# Patient Record
Sex: Male | Born: 1955
Health system: Southern US, Community
[De-identification: ages and names within clinical notes are randomized; demographics above are authoritative.]

## PROBLEM LIST (undated history)

## (undated) DIAGNOSIS — E785 Hyperlipidemia, unspecified: Secondary | ICD-10-CM

## (undated) DIAGNOSIS — E782 Mixed hyperlipidemia: Secondary | ICD-10-CM

## (undated) DIAGNOSIS — F319 Bipolar disorder, unspecified: Secondary | ICD-10-CM

## (undated) DIAGNOSIS — R7303 Prediabetes: Secondary | ICD-10-CM

## (undated) DIAGNOSIS — G4700 Insomnia, unspecified: Secondary | ICD-10-CM

## (undated) DIAGNOSIS — K219 Gastro-esophageal reflux disease without esophagitis: Secondary | ICD-10-CM

## (undated) DIAGNOSIS — F32A Depression, unspecified: Secondary | ICD-10-CM

## (undated) DIAGNOSIS — F419 Anxiety disorder, unspecified: Secondary | ICD-10-CM

## (undated) DIAGNOSIS — K635 Polyp of colon: Secondary | ICD-10-CM

## (undated) DIAGNOSIS — R413 Other amnesia: Secondary | ICD-10-CM

## (undated) DIAGNOSIS — F329 Major depressive disorder, single episode, unspecified: Secondary | ICD-10-CM

## (undated) DIAGNOSIS — H269 Unspecified cataract: Secondary | ICD-10-CM

## (undated) DIAGNOSIS — N400 Enlarged prostate without lower urinary tract symptoms: Secondary | ICD-10-CM

## (undated) DIAGNOSIS — H9313 Tinnitus, bilateral: Secondary | ICD-10-CM

## (undated) DIAGNOSIS — I499 Cardiac arrhythmia, unspecified: Secondary | ICD-10-CM

## (undated) DIAGNOSIS — Z6837 Body mass index (BMI) 37.0-37.9, adult: Secondary | ICD-10-CM

## (undated) DIAGNOSIS — K760 Fatty (change of) liver, not elsewhere classified: Secondary | ICD-10-CM

## (undated) DIAGNOSIS — N183 Chronic kidney disease, stage 3 unspecified: Secondary | ICD-10-CM

## (undated) DIAGNOSIS — G4730 Sleep apnea, unspecified: Secondary | ICD-10-CM

## (undated) DIAGNOSIS — Z8249 Family history of ischemic heart disease and other diseases of the circulatory system: Secondary | ICD-10-CM

## (undated) DIAGNOSIS — E559 Vitamin D deficiency, unspecified: Secondary | ICD-10-CM

## (undated) DIAGNOSIS — M2021 Hallux rigidus, right foot: Principal | ICD-10-CM

## (undated) DIAGNOSIS — R2681 Unsteadiness on feet: Secondary | ICD-10-CM

## (undated) DIAGNOSIS — I10 Essential (primary) hypertension: Secondary | ICD-10-CM

## (undated) DIAGNOSIS — E039 Hypothyroidism, unspecified: Secondary | ICD-10-CM

## (undated) DIAGNOSIS — R0602 Shortness of breath: Secondary | ICD-10-CM

## (undated) DIAGNOSIS — N179 Acute kidney failure, unspecified: Secondary | ICD-10-CM

## (undated) DIAGNOSIS — Z86010 Personal history of colonic polyps: Secondary | ICD-10-CM

## (undated) DIAGNOSIS — E079 Disorder of thyroid, unspecified: Secondary | ICD-10-CM

## (undated) DIAGNOSIS — J449 Chronic obstructive pulmonary disease, unspecified: Secondary | ICD-10-CM

## (undated) DIAGNOSIS — Z720 Tobacco use: Secondary | ICD-10-CM

## (undated) HISTORY — DX: Gastro-esophageal reflux disease without esophagitis: K21.9

## (undated) HISTORY — DX: Essential (primary) hypertension: I10

## (undated) HISTORY — DX: Benign prostatic hyperplasia without lower urinary tract symptoms: N40.0

## (undated) HISTORY — DX: Polyp of colon: K63.5

## (undated) HISTORY — DX: Mixed hyperlipidemia: E78.2

## (undated) HISTORY — DX: Morbid (severe) obesity due to excess calories: E66.01

## (undated) HISTORY — DX: Tobacco use: Z72.0

## (undated) HISTORY — DX: Prediabetes: R73.03

## (undated) HISTORY — DX: Tinnitus, bilateral: H93.13

## (undated) HISTORY — DX: Family history of ischemic heart disease and other diseases of the circulatory system: Z82.49

## (undated) HISTORY — DX: Chronic kidney disease, stage 3 unspecified: N18.30

## (undated) HISTORY — DX: Personal history of colonic polyps: Z86.010

## (undated) HISTORY — DX: Shortness of breath: R06.02

## (undated) HISTORY — DX: Insomnia, unspecified: G47.00

## (undated) HISTORY — DX: Bipolar disorder, unspecified: F31.9

## (undated) HISTORY — DX: Chronic obstructive pulmonary disease, unspecified: J44.9

## (undated) HISTORY — DX: Unspecified cataract: H26.9

## (undated) HISTORY — DX: Hallux rigidus, right foot: M20.21

## (undated) HISTORY — DX: Body mass index (BMI) 37.0-37.9, adult: Z68.37

## (undated) HISTORY — DX: Hypothyroidism, unspecified: E03.9

## (undated) HISTORY — DX: Unsteadiness on feet: R26.81

## (undated) HISTORY — PX: OTHER SURGICAL HISTORY: SHX169

## (undated) HISTORY — DX: Chronic kidney disease, stage 3 (moderate): N18.3

## (undated) HISTORY — DX: Vitamin D deficiency, unspecified: E55.9

## (undated) HISTORY — DX: Acute kidney failure, unspecified: N17.9

## (undated) HISTORY — PX: WISDOM TOOTH EXTRACTION: SHX21

## (undated) HISTORY — DX: Hyperlipidemia, unspecified: E78.5

## (undated) HISTORY — DX: Other amnesia: R41.3

## (undated) HISTORY — DX: Disorder of thyroid, unspecified: E07.9

---

## 2009-02-13 ENCOUNTER — Emergency Department (HOSPITAL_BASED_OUTPATIENT_CLINIC_OR_DEPARTMENT_OTHER): Admission: EM | Admit: 2009-02-13 | Discharge: 2009-02-13 | Payer: Self-pay | Admitting: Emergency Medicine

## 2009-02-13 ENCOUNTER — Ambulatory Visit: Payer: Self-pay | Admitting: Radiology

## 2011-03-22 LAB — URINALYSIS, ROUTINE W REFLEX MICROSCOPIC
Glucose, UA: NEGATIVE mg/dL
Hgb urine dipstick: NEGATIVE
Protein, ur: NEGATIVE mg/dL
Specific Gravity, Urine: 1.01 (ref 1.005–1.030)
pH: 6.5 (ref 5.0–8.0)

## 2012-06-04 ENCOUNTER — Other Ambulatory Visit: Payer: Self-pay | Admitting: Family Medicine

## 2012-06-04 DIAGNOSIS — R1310 Dysphagia, unspecified: Secondary | ICD-10-CM

## 2012-06-23 ENCOUNTER — Ambulatory Visit
Admission: RE | Admit: 2012-06-23 | Discharge: 2012-06-23 | Disposition: A | Discharge status: Home / Self Care | Payer: BC Managed Care – PPO | Source: Ambulatory Visit | Attending: Family Medicine | Admitting: Family Medicine

## 2012-06-23 DIAGNOSIS — R1310 Dysphagia, unspecified: Secondary | ICD-10-CM

## 2014-08-11 DIAGNOSIS — F319 Bipolar disorder, unspecified: Secondary | ICD-10-CM | POA: Insufficient documentation

## 2014-08-11 DIAGNOSIS — K219 Gastro-esophageal reflux disease without esophagitis: Secondary | ICD-10-CM

## 2014-08-11 DIAGNOSIS — E039 Hypothyroidism, unspecified: Secondary | ICD-10-CM

## 2014-08-11 HISTORY — DX: Hypothyroidism, unspecified: E03.9

## 2014-08-11 HISTORY — DX: Gastro-esophageal reflux disease without esophagitis: K21.9

## 2014-12-16 ENCOUNTER — Ambulatory Visit: Payer: BC Managed Care – PPO | Admitting: Cardiology

## 2015-01-19 ENCOUNTER — Encounter: Payer: Self-pay | Admitting: Interventional Cardiology

## 2015-01-19 ENCOUNTER — Ambulatory Visit: Payer: Self-pay | Admitting: Interventional Cardiology

## 2015-01-19 ENCOUNTER — Ambulatory Visit (INDEPENDENT_AMBULATORY_CARE_PROVIDER_SITE_OTHER): Payer: BLUE CROSS/BLUE SHIELD | Admitting: Interventional Cardiology

## 2015-01-19 VITALS — BP 105/79 | HR 100 | Ht 72.0 in | Wt 233.0 lb

## 2015-01-19 DIAGNOSIS — I10 Essential (primary) hypertension: Secondary | ICD-10-CM

## 2015-01-19 DIAGNOSIS — Z8249 Family history of ischemic heart disease and other diseases of the circulatory system: Secondary | ICD-10-CM

## 2015-01-19 DIAGNOSIS — R079 Chest pain, unspecified: Secondary | ICD-10-CM

## 2015-01-19 HISTORY — DX: Essential (primary) hypertension: I10

## 2015-01-19 HISTORY — DX: Family history of ischemic heart disease and other diseases of the circulatory system: Z82.49

## 2015-01-19 NOTE — Patient Instructions (Signed)
Your physician recommends that you continue on your current medications as directed. Please refer to the Current Medication list given to you today.  Your physician has requested that you have an exercise tolerance test. For further information please visit [email or web address]. Please also follow instruction sheet, as given.  Your physician recommends that you schedule a follow-up appointment as needed with Dr. Irish Lack.

## 2015-01-19 NOTE — Progress Notes (Signed)
Patient ID: Reginald Tucker, male   DOB: Aug 07, 1956, 59 y.o.   MRN: [id number]     Patient ID: Reginald Tucker MRN: [id number] DOB/AGE: 05-14-1956 59 y.o.   Referring Physician Dr. Samara Snide   Reason for Consultation chest pain/ family h/o CAD   HPI: 59 y/o with a h/o HTN and ED who presents for evaluation.  His father had an MI in his 74s.  The patient has had chest pains all of his life.  They occur in the center of the chest.  It is a dull pain that lasts several minutes.  Relieved with ibuprofen.  No assiciated sweating, nausea or left arm pain.  No prior cardiac testing done.  He has  A long smoking history, 25 pack years.  He now uses ecigs,for the past couple of years.   He has anxiety and depression issues.  No prior cholesterol problems.  BP not checked at home.     Current Outpatient Prescriptions  Medication Sig Dispense Refill  . clonazePAM (KLONOPIN) 1 MG tablet Take 1 mg by mouth.    . levothyroxine (SYNTHROID, LEVOTHROID) 150 MCG tablet Take 150 mcg by mouth.    . losartan-hydrochlorothiazide (HYZAAR) 100-25 MG per tablet Take 1 tablet by mouth.    Marland Kitchen PARoxetine (PAXIL) 40 MG tablet Take 40 mg by mouth.    . sildenafil (VIAGRA) 100 MG tablet Take 100 mg by mouth.    Marland Kitchen omeprazole (PRILOSEC) 20 MG capsule Take 20 mg by mouth.    . promethazine (PHENERGAN) 25 MG tablet Take 12.5 mg by mouth.     No current facility-administered medications for this visit.   Past Medical History  Diagnosis Date  . Thyroid disease   . Bipolar 1 disorder   . BPH (benign prostatic hyperplasia)   . Hypertension   . Colon polyps   . Cataract     Family History  Problem Relation Age of Onset  . Cancer Mother   . Hyperlipidemia Father   . Hypertension Father   . CAD Father   . Stroke Father   . Hyperlipidemia Maternal Aunt     History   Social History  . Marital Status: Married    Spouse Name: N/A  . Number of Children: N/A  . Years of Education: N/A   Occupational History    . Not on file.   Social History Main Topics  . Smoking status: Light Tobacco Smoker  . Smokeless tobacco: Not on file  . Alcohol Use: No  . Drug Use: No  . Sexual Activity: Not on file   Other Topics Concern  . Not on file   Social History Narrative  . No narrative on file    Past Surgical History  Procedure Laterality Date  . Colonos    . Colonscopy        (Not in a hospital admission)  Review of systems complete and found to be negative unless listed above .  No nausea, vomiting.  No fever chills, No focal weakness,  No palpitations.  Physical Exam: Filed Vitals:   01/19/15 1037  BP: 105/79  Pulse: 100    Weight: 233 lb (105.688 kg)  Physical exam: No apparent distress [place]/AT EOMI No JVD, No carotid bruit RRR S1S2  No wheezing Soft. NT, nondistended No edema. No focal motor or sensory deficits Normal affect  Labs:  No results found for: WBC, HGB, HCT, MCV, PLT No results for input(s): NA, K, CL, CO2, BUN, CREATININE, CALCIUM, PROT, BILITOT,  ALKPHOS, ALT, AST, GLUCOSE in the last 168 hours.  Invalid input(s): LABALBU No results found for: CKTOTAL, CKMB, CKMBINDEX, TROPONINI No results found for: CHOL No results found for: HDL No results found for: LDLCALC No results found for: TRIG No results found for: CHOLHDL No results found for: LDLDIRECT     HUT:MLYYTK  ASSESSMENT AND PLAN:   Atypical chest pain: Plan for exercise treadmill test. I doubt this represents ischemia. He does have a family history of heart disease with his father having had heart attack.  I recommended that he try to wean off of the e-cigarettes.  Hypertension: Blood pressure well controlled. Continue current medications.   For risk factor modification, he should have an LDL at least less than 130. This is followed by his primary care doctor.  We also discussed that the atrial enlargement which was found on his prior ECG is not a critical finding at this time. His only symptom is the  chest discomfort which we will evaluate with treadmill test.  Signed:   Mina Marble, MD, [facility] 01/19/2015, 10:55 AM

## 2015-02-21 ENCOUNTER — Ambulatory Visit (INDEPENDENT_AMBULATORY_CARE_PROVIDER_SITE_OTHER): Payer: PPO | Admitting: Physician Assistant

## 2015-02-21 DIAGNOSIS — R079 Chest pain, unspecified: Secondary | ICD-10-CM

## 2015-02-21 NOTE — Progress Notes (Signed)
Exercise Treadmill Test  Pre-Exercise Testing Evaluation Rhythm: normal sinus  Rate: 73     Test  Exercise Tolerance Test Ordering MD: Lendell Caprice, MD  Interpreting MD: Richardson Dopp, PA-C  Unique Test No: 1  Treadmill:  1  Indication for ETT: chest pain - rule out ischemia  Contraindication to ETT: No   Stress Modality: exercise - treadmill  Cardiac Imaging Performed: non   Protocol: standard Bruce - maximal  Max BP:  173/109  Max MPHR (bpm):  160 85% MPR (bpm):  136  MPHR obtained (bpm):  141 % MPHR obtained:  88  Reached 85% MPHR (min:sec):  2:39 Total Exercise Time (min-sec):  4:00  Workload in METS:  5.8 Borg Scale: 14  Reason ETT Terminated:  patient's desire to stop    ST Segment Analysis At Rest: normal ST segments - no evidence of significant ST depression With Exercise: non-specific ST changes  Other Information Arrhythmia:  No Angina during ETT:  absent (0) Quality of ETT:  diagnostic  ETT Interpretation:  normal - no evidence of ischemia by ST analysis  Comments: Fair exercise capacity. No chest pain. Normal BP response to exercise. No significant ST changes to suggest ischemia.  There was up-sloping ST depression at peak exercise.  Recommendations: FU with Dr. Casandra Doffing as planned. Signed,  Richardson Dopp, PA-C   02/21/2015 11:19 AM

## 2015-02-21 NOTE — Progress Notes (Signed)
Negative stress test

## 2015-05-27 ENCOUNTER — Emergency Department (HOSPITAL_COMMUNITY)
Admission: EM | Admit: 2015-05-27 | Discharge: 2015-05-27 | Disposition: A | Discharge status: Home / Self Care | Payer: PPO | Attending: Emergency Medicine | Admitting: Emergency Medicine

## 2015-05-27 ENCOUNTER — Encounter (HOSPITAL_COMMUNITY): Payer: Self-pay

## 2015-05-27 DIAGNOSIS — I10 Essential (primary) hypertension: Secondary | ICD-10-CM | POA: Diagnosis not present

## 2015-05-27 DIAGNOSIS — Z87438 Personal history of other diseases of male genital organs: Secondary | ICD-10-CM | POA: Diagnosis not present

## 2015-05-27 DIAGNOSIS — F419 Anxiety disorder, unspecified: Secondary | ICD-10-CM | POA: Diagnosis not present

## 2015-05-27 DIAGNOSIS — Z8669 Personal history of other diseases of the nervous system and sense organs: Secondary | ICD-10-CM | POA: Diagnosis not present

## 2015-05-27 DIAGNOSIS — Z86010 Personal history of colonic polyps: Secondary | ICD-10-CM | POA: Insufficient documentation

## 2015-05-27 DIAGNOSIS — T43505A Adverse effect of unspecified antipsychotics and neuroleptics, initial encounter: Secondary | ICD-10-CM | POA: Diagnosis not present

## 2015-05-27 DIAGNOSIS — T50905A Adverse effect of unspecified drugs, medicaments and biological substances, initial encounter: Secondary | ICD-10-CM

## 2015-05-27 DIAGNOSIS — G251 Drug-induced tremor: Secondary | ICD-10-CM | POA: Diagnosis present

## 2015-05-27 DIAGNOSIS — Z720 Tobacco use: Secondary | ICD-10-CM | POA: Diagnosis not present

## 2015-05-27 DIAGNOSIS — F319 Bipolar disorder, unspecified: Secondary | ICD-10-CM | POA: Diagnosis not present

## 2015-05-27 HISTORY — DX: Anxiety disorder, unspecified: F41.9

## 2015-05-27 LAB — BASIC METABOLIC PANEL
ANION GAP: 9 (ref 5–15)
BUN: 28 mg/dL — ABNORMAL HIGH (ref 6–20)
CALCIUM: 9.4 mg/dL (ref 8.9–10.3)
CHLORIDE: 106 mmol/L (ref 101–111)
CO2: 27 mmol/L (ref 22–32)
CREATININE: 1.16 mg/dL (ref 0.61–1.24)
GFR calc non Af Amer: >60 mL/min (ref >60)
Glucose, Bld: 100 mg/dL — ABNORMAL HIGH (ref 65–99)
Potassium: 4.3 mmol/L (ref 3.5–5.1)
SODIUM: 142 mmol/L (ref 135–145)

## 2015-05-27 LAB — CBC
HEMATOCRIT: 51.1 % (ref 39.0–52.0)
HEMOGLOBIN: 17.3 g/dL — AB (ref 13.0–17.0)
MCH: 31.5 pg (ref 26.0–34.0)
MCHC: 33.9 g/dL (ref 30.0–36.0)
MCV: 92.9 fL (ref 78.0–100.0)
Platelets: 262 10*3/uL (ref 150–400)
RBC: 5.5 MIL/uL (ref 4.22–5.81)
RDW: 12.7 % (ref 11.5–15.5)
WBC: 10.7 10*3/uL — AB (ref 4.0–10.5)

## 2015-05-27 MED ORDER — DIPHENHYDRAMINE HCL 25 MG PO CAPS
25.0000 mg | ORAL_CAPSULE | Freq: Once | ORAL | Status: AC
  Administered 2015-05-27: 25 mg via ORAL
  Filled 2015-05-27: qty 1

## 2015-05-27 MED ORDER — LORAZEPAM 1 MG PO TABS
2.0000 mg | ORAL_TABLET | Freq: Once | ORAL | Status: AC
  Administered 2015-05-27: 2 mg via ORAL
  Filled 2015-05-27: qty 2

## 2015-05-27 MED ORDER — LORAZEPAM 1 MG PO TABS
1.0000 mg | ORAL_TABLET | Freq: Once | ORAL | Status: AC
  Administered 2015-05-27: 1 mg via ORAL
  Filled 2015-05-27: qty 1

## 2015-05-27 MED ORDER — LORAZEPAM 1 MG PO TABS: 1.0000 mg | ORAL_TABLET | Freq: Four times a day (QID) | ORAL | Status: DC | PRN

## 2015-05-27 NOTE — Discharge Instructions (Signed)
Please stop your Vraylar!  Drug Toxicity You are having a reaction to your medicine. This does not mean you are allergic to the drug. Medicines can have many different side effects and toxic reactions. These include:  Stomach symptoms, such as nausea, vomiting, cramps, diarrhea, bloating, constipation, and dry mouth.  Nervous system symptoms, such as weakness, muscle spasms, drowsiness, confusion, agitation, and headache.  Heart and blood vessel symptoms, such as fainting, irregular heartbeat (palpitations), and fast heartbeat.  Skin symptoms, such as itching, light sensitivity, and rashes. When taking more medicines, there is an increased chance of a drug interaction that can make you sick. Take your medicines as your caregiver recommends. Keep a list of the names and doses of each of your drugs. Avoid alcohol and street drugs. Call your caregiver if you are worried about drug side effects or toxicity. Document Released: 11/26/2005 Document Revised: 02/18/2012 Document Reviewed: 05/13/2007 [facility] Patient Information 2015 [place], [place]. This information is not intended to replace advice given to you by your health care provider. Make sure you discuss any questions you have with your health care provider. .   Generalized Anxiety Disorder Generalized anxiety disorder (GAD) is a mental disorder. It interferes with life functions, including relationships, work, and school. GAD is different from normal anxiety, which everyone experiences at some point in their lives in response to specific life events and activities. Normal anxiety actually helps [place] prepare for and get through these life events and activities. Normal anxiety goes away after the event or activity is over.  GAD causes anxiety that is not necessarily related to specific events or activities. It also causes excess anxiety in proportion to specific events or activities. The anxiety associated with GAD is also difficult to control. GAD  can vary from mild to severe. People with severe GAD can have intense waves of anxiety with physical symptoms (panic attacks).  SYMPTOMS The anxiety and worry associated with GAD are difficult to control. This anxiety and worry are related to many life events and activities and also occur more days than not for 6 months or longer. People with GAD also have three or more of the following symptoms (one or more in children):  Restlessness.   Fatigue.  Difficulty concentrating.   Irritability.  Muscle tension.  Difficulty sleeping or unsatisfying sleep. DIAGNOSIS GAD is diagnosed through an assessment by your health care provider. Your health care provider will ask you questions aboutyour mood,physical symptoms, and events in your life. Your health care provider may ask you about your medical history and use of alcohol or drugs, including prescription medicines. Your health care provider may also do a physical exam and blood tests. Certain medical conditions and the use of certain substances can cause symptoms similar to those associated with GAD. Your health care provider may refer you to a mental health specialist for further evaluation. TREATMENT The following therapies are usually used to treat GAD:   Medication. Antidepressant medication usually is prescribed for long-term daily control. Antianxiety medicines may be added in severe cases, especially when panic attacks occur.   Talk therapy (psychotherapy). Certain types of talk therapy can be helpful in treating GAD by providing support, education, and guidance. A form of talk therapy called cognitive behavioral therapy can teach you healthy ways to think about and react to daily life events and activities.  Stress managementtechniques. These include yoga, meditation, and exercise and can be very helpful when they are practiced regularly. A mental health specialist can help determine which treatment  is best for you. Some people see  improvement with one therapy. However, other people require a combination of therapies. Document Released: 03/23/2013 Document Revised: 04/12/2014 Document Reviewed: 03/23/2013 [facility] Patient Information 2015 [place], [place]. This information is not intended to replace advice given to you by your health care provider. Make sure you discuss any questions you have with your health care provider.

## 2015-05-27 NOTE — ED Provider Notes (Signed)
CSN: [id number]     Arrival date & time 05/27/15  1447 History   First MD Initiated Contact with Patient 05/27/15 1749     Chief Complaint  Patient presents with  . Tremors  . Anxiety     (Consider location/radiation/quality/duration/timing/severity/associated sxs/prior Treatment) HPI Comments: Pt is a 59 y.o. male with Pmhx as above who presents with worsening anxiety and onset of tremors in his legs for about 2 weeks, and starting Vraylar for worsening depression.  He is also been titrated off of his pramipexole.  He has been on an unchanged dose of Paxil.  His doctor, Dr. Clovis Pu, called in a  Prescription for propranolol which did not improve symptoms.  He's had no fevers, chills, altered mental status  Patient is a 59 y.o. male presenting with anxiety.  Anxiety Pertinent negatives include no chest pain, no abdominal pain, no headaches and no shortness of breath.    Past Medical History  Diagnosis Date  . Thyroid disease   . Bipolar 1 disorder   . BPH (benign prostatic hyperplasia)   . Hypertension   . Colon polyps   . Cataract   . Anxiety    Past Surgical History  Procedure Laterality Date  . Colonos    . Colonscopy     Family History  Problem Relation Age of Onset  . Cancer Mother   . Hyperlipidemia Father   . Hypertension Father   . CAD Father   . Stroke Father   . Hyperlipidemia Brother    History  Substance Use Topics  . Smoking status: Light Tobacco Smoker  . Smokeless tobacco: Not on file     Comment: Pt is a heavy vape user  . Alcohol Use: No    Review of Systems  Constitutional: Negative for fever, activity change, appetite change and fatigue.  HENT: Negative for congestion, facial swelling, rhinorrhea and trouble swallowing.   Eyes: Negative for photophobia and pain.  Respiratory: Negative for cough, chest tightness and shortness of breath.   Cardiovascular: Negative for chest pain and leg swelling.  Gastrointestinal: Negative for nausea, vomiting,  abdominal pain, diarrhea and constipation.  Endocrine: Negative for polydipsia and polyuria.  Genitourinary: Negative for dysuria, urgency, decreased urine volume and difficulty urinating.  Musculoskeletal: Negative for back pain and gait problem.  Skin: Negative for color change, rash and wound.  Allergic/Immunologic: Negative for immunocompromised state.  Neurological: Positive for tremors. Negative for dizziness, facial asymmetry, speech difficulty, weakness, numbness and headaches.  Psychiatric/Behavioral: Negative for confusion, decreased concentration and agitation. The patient is nervous/anxious.       Allergies  Ambien; Septra; and Sulfamethoxazole  Home Medications   Prior to Admission medications   Medication Sig Start Date End Date Taking? Authorizing Provider  clonazePAM (KLONOPIN) 1 MG tablet Take 0.5 mg by mouth at bedtime.  07/18/14  Yes Historical Provider, MD  ibuprofen (ADVIL,MOTRIN) 200 MG tablet Take 400 mg by mouth every 6 (six) hours as needed for headache (headache).   Yes Historical Provider, MD  levothyroxine (SYNTHROID, LEVOTHROID) 137 MCG tablet TK 1 T PO QD 04/28/15  Yes Historical Provider, MD  losartan-hydrochlorothiazide (HYZAAR) 100-25 MG per tablet Take 1 tablet by mouth daily.  07/05/14  Yes Historical Provider, MD  omeprazole (PRILOSEC) 20 MG capsule Take 20 mg by mouth daily.    Yes Historical Provider, MD  PARoxetine (PAXIL) 40 MG tablet Take 40 mg by mouth daily.  07/27/14  Yes Historical Provider, MD  propranolol (INDERAL) 20 MG tablet Take 20 mg  by mouth every 6 (six) hours as needed (anxiety or tremors).   Yes Historical Provider, MD  QUEtiapine (SEROQUEL) 25 MG tablet Take 25 mg by mouth at bedtime.  05/12/15  Yes Historical Provider, MD  LORazepam (ATIVAN) 1 MG tablet Take 1 tablet (1 mg total) by mouth every 6 (six) hours as needed for anxiety. 05/27/15   Ernestina Patches, MD   BP 107/72 mmHg  Pulse 75  Temp(Src) 97.9 F (36.6 C) (Oral)  Resp 18   SpO2 96% Physical Exam  Constitutional: He is oriented to person, place, and time. He appears well-developed and well-nourished. No distress.  HENT:  Head: Normocephalic and atraumatic.  Mouth/Throat: No oropharyngeal exudate.  Eyes: Pupils are equal, round, and reactive to light.  Neck: Normal range of motion. Neck supple.  Cardiovascular: Normal rate, regular rhythm and normal heart sounds.  Exam reveals no gallop and no friction rub.   No murmur heard. Pulmonary/Chest: Effort normal and breath sounds normal. No respiratory distress. He has no wheezes. He has no rales.  Abdominal: Soft. Bowel sounds are normal. He exhibits no distension and no mass. There is no tenderness. There is no rebound and no guarding.  Musculoskeletal: Normal range of motion. He exhibits no edema or tenderness.  Neurological: He is alert and oriented to person, place, and time.  Tremor, right leg.  No clonus  Skin: Skin is warm and dry.  Psychiatric: He has a normal mood and affect.    ED Course  Procedures (including critical care time) Labs Review Labs Reviewed  CBC - Abnormal; Notable for the following:    WBC 10.7 (*)    Hemoglobin 17.3 (*)    All other components within normal limits  BASIC METABOLIC PANEL - Abnormal; Notable for the following:    Glucose, Bld 100 (*)    BUN 28 (*)    All other components within normal limits    Imaging Review No results found.   EKG Interpretation None      MDM   Final diagnoses:  Medication reaction, initial encounter   Pt is a 59 y.o. male with Pmhx as above who presents with worsening anxiety and onset of tremors in his legs for about 2 weeks, and starting Vraylar for worsening depression.  He is also been titrated off of his pramipexole.  He has been on an unchanged dose of Paxil.  His doctor, Dr. Clovis Pu, called in a  Prescription for propranolol which did not improve symptoms.  Clonazepam also did not improve symptoms.  On physical exam, vital signs  are stable.  He appears in no acute distress.  Skin is warm and dry.  Pupils are equal and reactive.  He has no clonus.  He does have a tremor in left leg and appears anxious.  Findings are not consistent with serotonin syndrome common side effects of his new medication include uncontrolled movements of the body, face, muscle stiffness, congestion, vomiting, sleepiness, and restlessness, which may be contributing to worsening symptoms.  Patient given 2 g by mouth Ativan and department, followed by a third 1 mg tab of Ativan and 25 mg of Benadryl and felt much improved.  CBC and BMP are grossly unremarkable.  I spoke to pharmacy about stopping this new medicine, given its very long half life.  They feel this is safe but states that the drugs active metabolites may be in his system for 1-3 weeks.  I have given him a short course of Ativan for home as this appears  to have worked better than the clonazepam but I told him not to take them for same time.  Do not clinically feel patient has serotonin syndrome.  He will call his prescribing doctor, and has a follow-up appointment on Tuesday.     [name] evaluation in the Emergency Department is complete. It has been determined that no acute conditions requiring further emergency intervention are present at this time. The patient/guardian have been advised of the diagnosis and plan. We have discussed signs and symptoms that warrant return to the ED, such as changes or worsening in symptoms, worsening tremor, confusion, fever     Ernestina Patches, MD 05/27/15 2356

## 2015-05-27 NOTE — ED Notes (Signed)
Pt c/o BLE and RUE tremor x 7 days and increased anxiety starting today.  Pt and Pt's wife reports recent psych medication changes.  Pt's wife sts that they were directed to come to the ED to have labs checked.

## 2015-08-04 ENCOUNTER — Other Ambulatory Visit: Payer: Self-pay | Admitting: Family Medicine

## 2015-08-04 ENCOUNTER — Ambulatory Visit
Admission: RE | Admit: 2015-08-04 | Discharge: 2015-08-04 | Disposition: A | Discharge status: Home / Self Care | Payer: PPO | Source: Ambulatory Visit | Attending: Family Medicine | Admitting: Family Medicine

## 2015-08-04 DIAGNOSIS — R0602 Shortness of breath: Secondary | ICD-10-CM

## 2015-12-16 DIAGNOSIS — E559 Vitamin D deficiency, unspecified: Secondary | ICD-10-CM | POA: Diagnosis not present

## 2015-12-16 DIAGNOSIS — I10 Essential (primary) hypertension: Secondary | ICD-10-CM | POA: Diagnosis not present

## 2015-12-16 DIAGNOSIS — E039 Hypothyroidism, unspecified: Secondary | ICD-10-CM | POA: Diagnosis not present

## 2015-12-22 DIAGNOSIS — F3181 Bipolar II disorder: Secondary | ICD-10-CM | POA: Diagnosis not present

## 2016-01-09 ENCOUNTER — Institutional Professional Consult (permissible substitution): Payer: PPO | Admitting: Internal Medicine

## 2016-01-13 ENCOUNTER — Encounter: Payer: Self-pay | Admitting: Internal Medicine

## 2016-01-13 ENCOUNTER — Ambulatory Visit (INDEPENDENT_AMBULATORY_CARE_PROVIDER_SITE_OTHER): Payer: PPO | Admitting: Internal Medicine

## 2016-01-13 VITALS — BP 116/78 | HR 108 | Ht 72.0 in | Wt 260.0 lb

## 2016-01-13 DIAGNOSIS — R0600 Dyspnea, unspecified: Secondary | ICD-10-CM | POA: Diagnosis not present

## 2016-01-13 DIAGNOSIS — J449 Chronic obstructive pulmonary disease, unspecified: Secondary | ICD-10-CM

## 2016-01-13 HISTORY — DX: Chronic obstructive pulmonary disease, unspecified: J44.9

## 2016-01-13 MED ORDER — TIOTROPIUM BROMIDE-OLODATEROL 2.5-2.5 MCG/ACT IN AERS: 2.0000 | INHALATION_SPRAY | Freq: Every day | RESPIRATORY_TRACT | Status: DC

## 2016-01-13 NOTE — Progress Notes (Signed)
Subjective:    Patient ID: Reginald Tucker, male    DOB: [birth year]     MRN: [id number]  HPI   33 yowm quit smoking 2002 with good ex in HS at 180-190 and worked in Biomedical scientist stopped doing around 2006 at wt 190  But progressive wt gain since and onset of sob 2015 gradually worse > referred to pulmonary clinic 01/13/2016 by Dr Samara Snide.   01/13/2016 1st [facility] office visit/ Anderson [facility]bro[facility]   Chief Complaint  Patient presents with  . Pulmonary Consult    Referred by Dr. Kenton Kingfisher for eval of COPD.  Pt c/o SOB for the past 2 yrs "probably" worse recently- to the point he is SOB "all the time"- with or without any exertion.   pt has bipolar, was inactive in 2015 x 6 m spent lying/sitting on couch and when got back to nl routine noted had gained significant  wt  assoc with sob sitting still/ oob room to room  s  am cough /congestion.  Some better on combivent respimat though technique very poor.   No obvious other patterns in day to day or daytime variabilty or assoc   cp or chest tightness, subjective wheeze overt sinus or hb symptoms. No unusual exp hx or h/o childhood pna/ asthma or knowledge of premature birth.  Sleeping ok without nocturnal  or early am exacerbation  of respiratory  c/o's or need for noct saba. Also denies any obvious fluctuation of symptoms with weather or environmental changes or other aggravating or alleviating factors except as outlined above   Current Medications, Allergies, Complete Past Medical History, Past Surgical History, Family History, and Social History were reviewed in [employer] record.          Review of Systems  Constitutional: Negative for fever, chills, activity change, appetite change and unexpected weight change.  HENT: Negative for congestion, dental problem, postnasal drip, rhinorrhea, sneezing, sore throat, trouble swallowing and voice change.   Eyes: Negative for visual disturbance.  Respiratory: Positive for shortness of  breath. Negative for cough and choking.   Cardiovascular: Negative for chest pain and leg swelling.  Gastrointestinal: Negative for nausea, vomiting and abdominal pain.  Genitourinary: Negative for difficulty urinating.  Musculoskeletal: Negative for arthralgias.  Skin: Negative for rash.  Psychiatric/Behavioral: Negative for behavioral problems and confusion.       Objective:   Physical Exam  amb wm with extremely gruff voice "born that way"  Wt Readings from Last 3 Encounters:  01/13/16 260 lb (117.935 kg)  01/19/15 233 lb (105.688 kg)    Vital signs reviewed   HEENT: nl dentition, turbinates, and oropharynx. Nl external ear canals without cough reflex   NECK :  without JVD/Nodes/TM/ nl carotid upstrokes bilaterally   LUNGS: no acc muscle use,  Nl contour chest which is clear to A and P bilaterally without cough on insp or exp maneuvers   CV:  RRR  no s3 or murmur or increase in P2, no edema   ABD:  Obese soft and nontender with pos hoover's end insp. No bruits or organomegaly, bowel sounds nl  MS:  Nl gait/ ext warm without deformities, calf tenderness, cyanosis or clubbing No obvious joint restrictions   SKIN: warm and dry without lesions    NEURO:  alert, approp, nl sensorium with  no motor deficits       I personally reviewed images and agree with radiology impression as follows:  CXR:  08/04/15 The lungs are adequately  inflated. There is no focal infiltrate. There is no pleural effusion. The heart and pulmonary vascularity are normal. The mediastinum is normal in width. There is no pleural effusion. The bony thorax exhibits no acute abnormality.        Assessment & Plan:

## 2016-01-13 NOTE — Patient Instructions (Addendum)
stiolto 2 pffs each am   Please schedule a follow up office visit in 6 weeks, call sooner if needed with pfts on return

## 2016-01-13 NOTE — Assessment & Plan Note (Addendum)
Spirometry 01/13/2016  FEV1 1.84 (47%)  Ratio 62  - 01/13/2016  Walked RA x 3 laps @ 185 ft each stopped due to  End of study, nl pace, no desat  / min sob    When respiratory symptoms begin or become refractory well after a patient reports complete smoking cessation,  Especially when this wasn't the case while they were smoking, a red flag is raised based on the work of Dr Kris Mouton which states:  if you quit smoking when your best day FEV1 is still well preserved it is highly unlikely you will progress to severe disease.  That is to say, once the smoking stops,  the symptoms should not suddenly erupt or markedly worsen.  If so, the differential diagnosis should include  obesity/deconditioning,  LPR/Reflux/Aspiration syndromes,  occult CHF, or  especially side effect of medications commonly used in this population.    Of these/ obesity/deconditioning clearly the most impt issues but worthwhile trying him on stiolto before returning for full pfts to sort out obst vs restrictive components   - The proper method of use, as well as anticipated side effects, of a metered-dose inhaler are discussed and demonstrated to the patient. Improved effectiveness after extensive coaching during this visit to a level of approximately 0 % from a baseline of 90 % (had been using combivent but did not know to use the trigger when breathing in)   I had an extended discussion with the patient/wife  reviewing all relevant studies completed to date and  lasting 35 minutes of a 71minute visit    Each maintenance medication was reviewed in detail including most importantly the difference between maintenance and prns and under what circumstances the prns are to be triggered using an action plan format that is not reflected in the computer generated alphabetically organized AVS.    Please see instructions for details which were reviewed in writing and the patient given a copy highlighting the part that I personally wrote and  discussed at today's ov.

## 2016-01-16 DIAGNOSIS — F3132 Bipolar disorder, current episode depressed, moderate: Secondary | ICD-10-CM | POA: Diagnosis not present

## 2016-01-21 ENCOUNTER — Encounter: Payer: Self-pay | Admitting: Internal Medicine

## 2016-02-24 ENCOUNTER — Ambulatory Visit: Payer: PPO | Admitting: Internal Medicine

## 2016-02-24 ENCOUNTER — Encounter (HOSPITAL_COMMUNITY): Payer: PPO

## 2016-03-16 ENCOUNTER — Ambulatory Visit (HOSPITAL_COMMUNITY)
Admission: RE | Admit: 2016-03-16 | Discharge: 2016-03-16 | Disposition: A | Discharge status: Home / Self Care | Payer: PPO | Source: Ambulatory Visit | Attending: Internal Medicine | Admitting: Internal Medicine

## 2016-03-16 ENCOUNTER — Encounter: Payer: Self-pay | Admitting: Internal Medicine

## 2016-03-16 ENCOUNTER — Ambulatory Visit (INDEPENDENT_AMBULATORY_CARE_PROVIDER_SITE_OTHER): Payer: PPO | Admitting: Internal Medicine

## 2016-03-16 VITALS — BP 106/80 | HR 115 | Ht 72.0 in | Wt 262.0 lb

## 2016-03-16 DIAGNOSIS — J449 Chronic obstructive pulmonary disease, unspecified: Secondary | ICD-10-CM

## 2016-03-16 DIAGNOSIS — R0600 Dyspnea, unspecified: Secondary | ICD-10-CM | POA: Diagnosis not present

## 2016-03-16 LAB — PULMONARY FUNCTION TEST
DL/VA % pred: 86 %
DL/VA: 4.1 ml/min/mmHg/L
DLCO UNC % PRED: 66 %
DLCO unc: 23.42 ml/min/mmHg
FEF 25-75 PRE: 1.25 L/s
FEF 25-75 Post: 2.07 L/sec
FEF2575-%CHANGE-POST: 65 %
FEF2575-%Pred-Post: 67 %
FEF2575-%Pred-Pre: 40 %
FEV1-%Change-Post: 12 %
FEV1-%PRED-POST: 59 %
FEV1-%PRED-PRE: 53 %
FEV1-POST: 2.28 L
FEV1-Pre: 2.03 L
FEV1FVC-%Change-Post: -1 %
FEV1FVC-%Pred-Pre: 90 %
FEV6-%CHANGE-POST: 13 %
FEV6-%PRED-POST: 70 %
FEV6-%PRED-PRE: 61 %
FEV6-PRE: 2.97 L
FEV6-Post: 3.38 L
FEV6FVC-%CHANGE-POST: 0 %
FEV6FVC-%PRED-PRE: 104 %
FEV6FVC-%Pred-Post: 104 %
FVC-%CHANGE-POST: 13 %
FVC-%PRED-POST: 67 %
FVC-%Pred-Pre: 59 %
FVC-Post: 3.4 L
FVC-Pre: 2.98 L
POST FEV6/FVC RATIO: 99 %
Post FEV1/FVC ratio: 67 %
Pre FEV1/FVC ratio: 68 %
Pre FEV6/FVC Ratio: 100 %

## 2016-03-16 MED ORDER — ALBUTEROL SULFATE (2.5 MG/3ML) 0.083% IN NEBU
2.5000 mg | INHALATION_SOLUTION | Freq: Once | RESPIRATORY_TRACT | Status: AC
  Administered 2016-03-16: 2.5 mg via RESPIRATORY_TRACT

## 2016-03-16 MED ORDER — IPRATROPIUM-ALBUTEROL 20-100 MCG/ACT IN AERS: INHALATION_SPRAY | RESPIRATORY_TRACT | Status: DC

## 2016-03-16 NOTE — Progress Notes (Signed)
Subjective:    Patient ID: Reginald Tucker, male    DOB: May 02, 1956     MRN: [id number]    Brief patient profile:  47 yowm quit smoking 2002 with good ex in HS at 180-190 and worked in Biomedical scientist stopped doing around 2006 at wt 89  But progressive wt gain since and onset of sob 2015 gradually worse > referred to pulmonary clinic 01/13/2016 by Dr Samara Snide.    History of Present Illness  01/13/2016 1st [facility] office visit/ Reginald Tucker   Chief Complaint  Patient presents with  . Pulmonary Consult    Referred by Dr. Kenton Kingfisher for eval of COPD.  Pt c/o SOB for the past 2 yrs "probably" worse recently- to the point he is SOB "all the time"- with or without any exertion.   pt has bipolar, was inactive in 2015 x 6 m spent lying/sitting on couch and when got back to nl routine noted had gained significant  wt  assoc with sob sitting still/ oob room to room  s  am cough /congestion.  Some better on combivent respimat though technique very poor.  rec stiolto 2 pffs each am    03/16/2016  f/u ov/Kele Withem re: GOLD II copd/ no better on stiolto so stopped   Chief Complaint  Patient presents with  . Follow-up    PFT done at [facility]. Pt states that his breathing is unchanged. No new co's today.   sleeps ok x snores/ excess/   Not limited by breathing from desired activities    No obvious day to day or daytime variability or assoc excess/ purulent sputum or mucus plugs   or cp or chest tightness, subjective wheeze or overt sinus or hb symptoms. No unusual exp hx or h/o childhood pna/ asthma or knowledge of premature birth.  Sleeping ok without nocturnal  or early am exacerbation  of respiratory  c/o's or need for noct saba. Also denies any obvious fluctuation of symptoms with weather or environmental changes or other aggravating or alleviating factors except as outlined above   Current Medications, Allergies, Complete Past Medical History, Past Surgical History, Family History, and Social History were  reviewed in [employer] record.  ROS  The following are not active complaints unless bolded sore throat, dysphagia, dental problems, itching, sneezing,  nasal congestion or excess/ purulent secretions, ear ache,   fever, chills, sweats, unintended wt loss, classically pleuritic or exertional cp, hemoptysis,  orthopnea pnd or leg swelling, presyncope, palpitations, abdominal pain, anorexia, nausea, vomiting, diarrhea  or change in bowel or bladder habits, change in stools or urine, dysuria,hematuria,  rash, arthralgias, visual complaints, headache, numbness, weakness or ataxia or problems with walking or coordination,  change in mood/affect or memory.          .                Objective:   Physical Exam  amb wm with extremely gruff voice "born that way"   Wt Readings from Last 3 Encounters:  03/16/16 262 lb (118.842 kg)  01/13/16 260 lb (117.935 kg)  01/19/15 233 lb (105.688 kg)    Vital signs reviewed       HEENT: nl dentition, turbinates, and oropharynx. Nl external ear canals without cough reflex   NECK :  without JVD/Nodes/TM/ nl carotid upstrokes bilaterally   LUNGS: no acc muscle use,  Nl contour chest which is clear to A and P bilaterally without cough on insp or exp maneuvers  CV:  RRR  no s3 or murmur or increase in P2, no edema   ABD:  Obese soft and nontender with pos hoover's end insp. No bruits or organomegaly, bowel sounds nl  MS:  Nl gait/ ext warm without deformities, calf tenderness, cyanosis or clubbing No obvious joint restrictions   SKIN: warm and dry without lesions    NEURO:  alert, approp, nl sensorium with  no motor deficits       I personally reviewed images and agree with radiology impression as follows:  CXR:  08/04/15 The lungs are adequately inflated. There is no focal infiltrate. There is no pleural effusion. The heart and pulmonary vascularity are normal. The mediastinum is normal in width. There is no  pleural effusion. The bony thorax exhibits no acute abnormality.        Assessment & Plan:

## 2016-03-16 NOTE — Patient Instructions (Addendum)
You do have mild copd but it's not what's holding you back and unlikely to worsen unless you resume smoking cigarettes   You can use combivent up to 4 x daily as needed for breathing   Weight control is simply a matter of calorie balance which needs to be tilted in your favor by eating less and exercising more.  To get the most out of exercise, you need to be continuously aware that you are short of breath, but never out of breath, for 30 minutes daily. As you improve, it will actually be easier for you to do the same amount of exercise  in  30 minutes so always push to the level where you are short of breath.  If this does not result in gradual weight reduction then I strongly recommend you see a nutritionist with a food diary x 2 weeks so that we can work out a negative calorie balance which is universally effective in steady weight loss programs.  Think of your calorie balance like you do your bank account where in this case you want the balance to go down so you must take in less calories than you burn up.  It's just that simple:  Hard to do, but easy to understand.  Good luck!   Pulmonary follow up is as needed

## 2016-03-18 HISTORY — DX: Morbid (severe) obesity due to excess calories: E66.01

## 2016-03-18 NOTE — Assessment & Plan Note (Signed)
Complicated by HBP/ Low erv on pfts 03/16/2016 (31%)   Body mass index is 35.53    No results found for: TSH   Contributing to gerd tendency/ doe/reviewed the need and the process to achieve and maintain neg calorie balance > defer f/u primary care including intermittently monitoring thyroid status

## 2016-03-18 NOTE — Assessment & Plan Note (Signed)
Spirometry 01/13/2016  FEV1 1.84 (47%)  Ratio 62  - 01/13/2016  extensive coaching HFA effectiveness =    90% > try stiolto respimat 2 pffs each am > did not benefit so stopped when sample out - 01/13/2016  Walked RA x 3 laps @ 185 ft each stopped due to  End of study, nl pace, no desat  / min sob  - PFT's  03/16/2016  FEV1 2.28 (59 % ) ratio 67  p 12 % improvement from saba p no prior to study with DLCO  66 % corrects to 86 % for alv volume    Clearly improved from baseline spirometry but could not tell any difference on stiolto so ok to just use prn combient  As I explained to this patient in detail:  although there may be copd present, it may not be clinically relevant:   it does not appear to be limiting activity tolerance any more than a set of worn tires limits someone from driving a car  around a parking lot.  A new set of Michelins might look good but would have no perceived impact on the performance of the car and would not be worth the cost.  That is to say:   I don't recommend aggressive pulmonary rx at this point unless limiting symptoms arise or acute exacerbations become as issue, neither of which is the case now.  I asked the patient to contact this office at any time in the future should either of these problems arise.    I had an extended discussion with the patient reviewing all relevant studies completed to date and  lasting 15 to 20 minutes of a 25 minute visit    Each maintenance medication was reviewed in detail including most importantly the difference between maintenance and prns and under what circumstances the prns are to be triggered using an action plan format that is not reflected in the computer generated alphabetically organized AVS.    Please see instructions for details which were reviewed in writing and the patient given a copy highlighting the part that I personally wrote and discussed at today's ov.  Pulmonary f/u can be prn

## 2016-03-22 DIAGNOSIS — F3132 Bipolar disorder, current episode depressed, moderate: Secondary | ICD-10-CM | POA: Diagnosis not present

## 2016-04-13 DIAGNOSIS — F3132 Bipolar disorder, current episode depressed, moderate: Secondary | ICD-10-CM | POA: Diagnosis not present

## 2016-04-19 DIAGNOSIS — F3132 Bipolar disorder, current episode depressed, moderate: Secondary | ICD-10-CM | POA: Diagnosis not present

## 2016-05-15 DIAGNOSIS — F3132 Bipolar disorder, current episode depressed, moderate: Secondary | ICD-10-CM | POA: Diagnosis not present

## 2016-05-22 DIAGNOSIS — F3132 Bipolar disorder, current episode depressed, moderate: Secondary | ICD-10-CM | POA: Diagnosis not present

## 2016-05-28 DIAGNOSIS — I10 Essential (primary) hypertension: Secondary | ICD-10-CM | POA: Diagnosis not present

## 2016-05-28 DIAGNOSIS — H5211 Myopia, right eye: Secondary | ICD-10-CM | POA: Diagnosis not present

## 2016-05-28 DIAGNOSIS — H5202 Hypermetropia, left eye: Secondary | ICD-10-CM | POA: Diagnosis not present

## 2016-05-28 DIAGNOSIS — H35033 Hypertensive retinopathy, bilateral: Secondary | ICD-10-CM | POA: Diagnosis not present

## 2016-05-28 DIAGNOSIS — H524 Presbyopia: Secondary | ICD-10-CM | POA: Diagnosis not present

## 2016-05-28 DIAGNOSIS — H25813 Combined forms of age-related cataract, bilateral: Secondary | ICD-10-CM | POA: Diagnosis not present

## 2016-05-28 DIAGNOSIS — H52223 Regular astigmatism, bilateral: Secondary | ICD-10-CM | POA: Diagnosis not present

## 2016-06-28 DIAGNOSIS — F3132 Bipolar disorder, current episode depressed, moderate: Secondary | ICD-10-CM | POA: Diagnosis not present

## 2016-08-08 DIAGNOSIS — F3132 Bipolar disorder, current episode depressed, moderate: Secondary | ICD-10-CM | POA: Diagnosis not present

## 2016-08-14 DIAGNOSIS — Z79899 Other long term (current) drug therapy: Secondary | ICD-10-CM | POA: Diagnosis not present

## 2016-08-14 DIAGNOSIS — F3175 Bipolar disorder, in partial remission, most recent episode depressed: Secondary | ICD-10-CM | POA: Diagnosis not present

## 2016-08-14 DIAGNOSIS — E559 Vitamin D deficiency, unspecified: Secondary | ICD-10-CM | POA: Diagnosis not present

## 2016-09-10 DIAGNOSIS — E559 Vitamin D deficiency, unspecified: Secondary | ICD-10-CM | POA: Diagnosis not present

## 2016-09-10 DIAGNOSIS — J449 Chronic obstructive pulmonary disease, unspecified: Secondary | ICD-10-CM | POA: Diagnosis not present

## 2016-09-10 DIAGNOSIS — E782 Mixed hyperlipidemia: Secondary | ICD-10-CM | POA: Diagnosis not present

## 2016-09-10 DIAGNOSIS — Z86010 Personal history of colonic polyps: Secondary | ICD-10-CM | POA: Diagnosis not present

## 2016-09-10 DIAGNOSIS — I10 Essential (primary) hypertension: Secondary | ICD-10-CM | POA: Diagnosis not present

## 2016-09-10 DIAGNOSIS — F319 Bipolar disorder, unspecified: Secondary | ICD-10-CM | POA: Diagnosis not present

## 2016-09-10 DIAGNOSIS — E039 Hypothyroidism, unspecified: Secondary | ICD-10-CM | POA: Diagnosis not present

## 2016-09-10 DIAGNOSIS — K219 Gastro-esophageal reflux disease without esophagitis: Secondary | ICD-10-CM | POA: Diagnosis not present

## 2016-09-21 DIAGNOSIS — D122 Benign neoplasm of ascending colon: Secondary | ICD-10-CM | POA: Diagnosis not present

## 2016-09-21 DIAGNOSIS — D126 Benign neoplasm of colon, unspecified: Secondary | ICD-10-CM | POA: Diagnosis not present

## 2016-09-21 DIAGNOSIS — Z86010 Personal history of colonic polyps: Secondary | ICD-10-CM | POA: Diagnosis not present

## 2016-09-21 DIAGNOSIS — D123 Benign neoplasm of transverse colon: Secondary | ICD-10-CM | POA: Diagnosis not present

## 2016-09-25 DIAGNOSIS — N61 Mastitis without abscess: Secondary | ICD-10-CM | POA: Diagnosis not present

## 2016-09-27 DIAGNOSIS — D126 Benign neoplasm of colon, unspecified: Secondary | ICD-10-CM | POA: Diagnosis not present

## 2016-09-28 DIAGNOSIS — L723 Sebaceous cyst: Secondary | ICD-10-CM | POA: Diagnosis not present

## 2016-09-28 DIAGNOSIS — L089 Local infection of the skin and subcutaneous tissue, unspecified: Secondary | ICD-10-CM | POA: Diagnosis not present

## 2016-10-09 DIAGNOSIS — L723 Sebaceous cyst: Secondary | ICD-10-CM | POA: Diagnosis not present

## 2016-10-09 DIAGNOSIS — L089 Local infection of the skin and subcutaneous tissue, unspecified: Secondary | ICD-10-CM | POA: Diagnosis not present

## 2016-10-15 DIAGNOSIS — N289 Disorder of kidney and ureter, unspecified: Secondary | ICD-10-CM | POA: Diagnosis not present

## 2016-10-15 DIAGNOSIS — E782 Mixed hyperlipidemia: Secondary | ICD-10-CM | POA: Diagnosis not present

## 2016-10-16 ENCOUNTER — Other Ambulatory Visit: Payer: Self-pay | Admitting: Family Medicine

## 2016-10-16 DIAGNOSIS — R7989 Other specified abnormal findings of blood chemistry: Secondary | ICD-10-CM

## 2016-10-16 DIAGNOSIS — R945 Abnormal results of liver function studies: Principal | ICD-10-CM

## 2016-10-22 DIAGNOSIS — L723 Sebaceous cyst: Secondary | ICD-10-CM | POA: Diagnosis not present

## 2016-10-22 DIAGNOSIS — L089 Local infection of the skin and subcutaneous tissue, unspecified: Secondary | ICD-10-CM | POA: Diagnosis not present

## 2016-11-06 ENCOUNTER — Ambulatory Visit: Payer: Self-pay | Admitting: General Surgery

## 2016-11-06 DIAGNOSIS — L723 Sebaceous cyst: Secondary | ICD-10-CM | POA: Diagnosis not present

## 2016-11-06 DIAGNOSIS — L089 Local infection of the skin and subcutaneous tissue, unspecified: Secondary | ICD-10-CM | POA: Diagnosis not present

## 2016-11-09 DIAGNOSIS — F3132 Bipolar disorder, current episode depressed, moderate: Secondary | ICD-10-CM | POA: Diagnosis not present

## 2016-12-10 HISTORY — PX: TOE FUSION: SHX1070

## 2016-12-18 ENCOUNTER — Encounter (HOSPITAL_BASED_OUTPATIENT_CLINIC_OR_DEPARTMENT_OTHER): Payer: Self-pay | Admitting: *Deleted

## 2016-12-19 ENCOUNTER — Encounter (HOSPITAL_BASED_OUTPATIENT_CLINIC_OR_DEPARTMENT_OTHER)
Admission: RE | Admit: 2016-12-19 | Discharge: 2016-12-19 | Disposition: A | Discharge status: Home / Self Care | Payer: PPO | Source: Ambulatory Visit | Attending: General Surgery | Admitting: General Surgery

## 2016-12-19 ENCOUNTER — Ambulatory Visit
Admission: RE | Admit: 2016-12-19 | Discharge: 2016-12-19 | Disposition: A | Discharge status: Home / Self Care | Payer: PPO | Source: Ambulatory Visit | Attending: General Surgery | Admitting: General Surgery

## 2016-12-19 ENCOUNTER — Other Ambulatory Visit: Payer: Self-pay | Admitting: General Surgery

## 2016-12-19 DIAGNOSIS — Z01810 Encounter for preprocedural cardiovascular examination: Secondary | ICD-10-CM | POA: Diagnosis not present

## 2016-12-19 DIAGNOSIS — Z01812 Encounter for preprocedural laboratory examination: Secondary | ICD-10-CM | POA: Insufficient documentation

## 2016-12-19 DIAGNOSIS — L723 Sebaceous cyst: Secondary | ICD-10-CM | POA: Diagnosis not present

## 2016-12-19 DIAGNOSIS — Z01811 Encounter for preprocedural respiratory examination: Secondary | ICD-10-CM

## 2016-12-19 DIAGNOSIS — R918 Other nonspecific abnormal finding of lung field: Secondary | ICD-10-CM | POA: Diagnosis not present

## 2016-12-19 LAB — CBC WITH DIFFERENTIAL/PLATELET
Basophils Absolute: 0 10*3/uL (ref 0.0–0.1)
Basophils Relative: 0 %
Eosinophils Absolute: 0 10*3/uL (ref 0.0–0.7)
Eosinophils Relative: 0 %
HCT: 43.9 % (ref 39.0–52.0)
Hemoglobin: 14.9 g/dL (ref 13.0–17.0)
Lymphocytes Relative: 28 %
Lymphs Abs: 1.3 10*3/uL (ref 0.7–4.0)
MCH: 31.1 pg (ref 26.0–34.0)
MCHC: 33.9 g/dL (ref 30.0–36.0)
MCV: 91.6 fL (ref 78.0–100.0)
Monocytes Absolute: 0.6 10*3/uL (ref 0.1–1.0)
Monocytes Relative: 11 %
Neutro Abs: 2.9 10*3/uL (ref 1.7–7.7)
Neutrophils Relative %: 61 %
Platelets: 213 10*3/uL (ref 150–400)
RBC: 4.79 MIL/uL (ref 4.22–5.81)
RDW: 12.6 % (ref 11.5–15.5)
WBC: 4.8 10*3/uL (ref 4.0–10.5)

## 2016-12-19 LAB — BASIC METABOLIC PANEL WITH GFR
Anion gap: 8 (ref 5–15)
BUN: 29 mg/dL — ABNORMAL HIGH (ref 6–20)
CO2: 25 mmol/L (ref 22–32)
Calcium: 9.2 mg/dL (ref 8.9–10.3)
Chloride: 109 mmol/L (ref 101–111)
Creatinine, Ser: 1.38 mg/dL — ABNORMAL HIGH (ref 0.61–1.24)
GFR calc Af Amer: >60 mL/min
GFR calc non Af Amer: 53 mL/min — ABNORMAL LOW
Glucose, Bld: 111 mg/dL — ABNORMAL HIGH (ref 65–99)
Potassium: 4.7 mmol/L (ref 3.5–5.1)
Sodium: 142 mmol/L (ref 135–145)

## 2016-12-19 NOTE — Progress Notes (Signed)
Pt in for PAT appt, EKG reveiwed by Dr. Deatra Canter.

## 2016-12-21 DIAGNOSIS — F3132 Bipolar disorder, current episode depressed, moderate: Secondary | ICD-10-CM | POA: Diagnosis not present

## 2016-12-23 NOTE — Anesthesia Preprocedure Evaluation (Addendum)
Anesthesia Evaluation  Patient identified by MRN, date of birth, ID band Patient awake    Reviewed: Allergy & Precautions, H&P , Patient's Chart, lab work & pertinent test results, reviewed documented beta blocker date and time   Airway Mallampati: III  TM Distance: >3 FB Neck ROM: full  Mouth opening: Limited Mouth Opening  Dental no notable dental hx.    Pulmonary former smoker,    Pulmonary exam normal breath sounds clear to auscultation       Cardiovascular hypertension,  Rhythm:regular Rate:Normal     Neuro/Psych    GI/Hepatic   Endo/Other    Renal/GU      Musculoskeletal   Abdominal   Peds  Hematology   Anesthesia Other Findings   Reproductive/Obstetrics                            Anesthesia Physical Anesthesia Plan  ASA: III  Anesthesia Plan: General   Post-op Pain Management:    Induction: Intravenous  Airway Management Planned: LMA  Additional Equipment:   Intra-op Plan:   Post-operative Plan:   Informed Consent: I have reviewed the patients History and Physical, chart, labs and discussed the procedure including the risks, benefits and alternatives for the proposed anesthesia with the patient or authorized representative who has indicated his/her understanding and acceptance.   Dental Advisory Given and Dental advisory given  Plan Discussed with: CRNA and Surgeon  Anesthesia Plan Comments: (Discussed GA with LMA, possible sore throat, potential need to switch to ETT, N/V, pulmonary aspiration. Questions answered. )        Anesthesia Quick Evaluation

## 2016-12-24 ENCOUNTER — Ambulatory Visit (HOSPITAL_BASED_OUTPATIENT_CLINIC_OR_DEPARTMENT_OTHER): Payer: PPO | Admitting: Anesthesiology

## 2016-12-24 ENCOUNTER — Encounter (HOSPITAL_BASED_OUTPATIENT_CLINIC_OR_DEPARTMENT_OTHER)
Admission: RE | Disposition: A | Discharge status: Home / Self Care | Payer: Self-pay | Source: Ambulatory Visit | Attending: General Surgery

## 2016-12-24 ENCOUNTER — Ambulatory Visit (HOSPITAL_BASED_OUTPATIENT_CLINIC_OR_DEPARTMENT_OTHER)
Admission: RE | Admit: 2016-12-24 | Discharge: 2016-12-24 | Disposition: A | Discharge status: Home / Self Care | Payer: PPO | Source: Ambulatory Visit | Attending: General Surgery | Admitting: General Surgery

## 2016-12-24 ENCOUNTER — Encounter (HOSPITAL_BASED_OUTPATIENT_CLINIC_OR_DEPARTMENT_OTHER): Payer: Self-pay

## 2016-12-24 ENCOUNTER — Ambulatory Visit (HOSPITAL_COMMUNITY): Payer: PPO

## 2016-12-24 DIAGNOSIS — Z882 Allergy status to sulfonamides status: Secondary | ICD-10-CM | POA: Insufficient documentation

## 2016-12-24 DIAGNOSIS — Z888 Allergy status to other drugs, medicaments and biological substances status: Secondary | ICD-10-CM | POA: Insufficient documentation

## 2016-12-24 DIAGNOSIS — Z87891 Personal history of nicotine dependence: Secondary | ICD-10-CM | POA: Diagnosis not present

## 2016-12-24 DIAGNOSIS — Z01818 Encounter for other preprocedural examination: Secondary | ICD-10-CM

## 2016-12-24 DIAGNOSIS — L720 Epidermal cyst: Secondary | ICD-10-CM | POA: Insufficient documentation

## 2016-12-24 DIAGNOSIS — Z79899 Other long term (current) drug therapy: Secondary | ICD-10-CM | POA: Insufficient documentation

## 2016-12-24 DIAGNOSIS — I10 Essential (primary) hypertension: Secondary | ICD-10-CM | POA: Diagnosis not present

## 2016-12-24 DIAGNOSIS — J449 Chronic obstructive pulmonary disease, unspecified: Secondary | ICD-10-CM | POA: Diagnosis not present

## 2016-12-24 DIAGNOSIS — L723 Sebaceous cyst: Secondary | ICD-10-CM | POA: Diagnosis not present

## 2016-12-24 DIAGNOSIS — Z8249 Family history of ischemic heart disease and other diseases of the circulatory system: Secondary | ICD-10-CM | POA: Diagnosis not present

## 2016-12-24 HISTORY — PX: MASS EXCISION: SHX2000

## 2016-12-24 HISTORY — PX: CYST EXCISION: SHX5701

## 2016-12-24 SURGERY — EXCISION MASS
Anesthesia: General | Laterality: Left

## 2016-12-24 MED ORDER — DEXAMETHASONE SODIUM PHOSPHATE 4 MG/ML IJ SOLN
INTRAMUSCULAR | Status: DC | PRN
  Administered 2016-12-24: 10 mg via INTRAVENOUS

## 2016-12-24 MED ORDER — MIDAZOLAM HCL 2 MG/2ML IJ SOLN
INTRAMUSCULAR | Status: AC
  Filled 2016-12-24: qty 2

## 2016-12-24 MED ORDER — MIDAZOLAM HCL 5 MG/5ML IJ SOLN
INTRAMUSCULAR | Status: DC | PRN
  Administered 2016-12-24: 2 mg via INTRAVENOUS

## 2016-12-24 MED ORDER — TRAMADOL HCL 50 MG PO TABS: 50.0000 mg | ORAL_TABLET | Freq: Four times a day (QID) | ORAL | 0 refills | Status: DC | PRN

## 2016-12-24 MED ORDER — DEXAMETHASONE SODIUM PHOSPHATE 10 MG/ML IJ SOLN
INTRAMUSCULAR | Status: AC
  Filled 2016-12-24: qty 1

## 2016-12-24 MED ORDER — FENTANYL CITRATE (PF) 100 MCG/2ML IJ SOLN
INTRAMUSCULAR | Status: DC | PRN
  Administered 2016-12-24: 100 ug via INTRAVENOUS

## 2016-12-24 MED ORDER — MIDAZOLAM HCL 2 MG/2ML IJ SOLN: 1.0000 mg | INTRAMUSCULAR | Status: DC | PRN

## 2016-12-24 MED ORDER — ACETAMINOPHEN 160 MG/5ML PO SOLN: 960.0000 mg | Freq: Once | ORAL | Status: DC

## 2016-12-24 MED ORDER — FENTANYL CITRATE (PF) 100 MCG/2ML IJ SOLN
INTRAMUSCULAR | Status: AC
  Filled 2016-12-24: qty 2

## 2016-12-24 MED ORDER — PROPOFOL 10 MG/ML IV BOLUS
INTRAVENOUS | Status: AC
  Filled 2016-12-24: qty 20

## 2016-12-24 MED ORDER — FENTANYL CITRATE (PF) 100 MCG/2ML IJ SOLN
25.0000 ug | INTRAMUSCULAR | Status: DC | PRN
  Administered 2016-12-24: 50 ug via INTRAVENOUS

## 2016-12-24 MED ORDER — VANCOMYCIN HCL 10 G IV SOLR: 1500.0000 mg | INTRAVENOUS | Status: DC

## 2016-12-24 MED ORDER — CHLORHEXIDINE GLUCONATE CLOTH 2 % EX PADS: 6.0000 | MEDICATED_PAD | Freq: Once | CUTANEOUS | Status: DC

## 2016-12-24 MED ORDER — ARTIFICIAL TEARS OP OINT
TOPICAL_OINTMENT | OPHTHALMIC | Status: AC
  Filled 2016-12-24: qty 3.5

## 2016-12-24 MED ORDER — PROPOFOL 10 MG/ML IV BOLUS
INTRAVENOUS | Status: DC | PRN
  Administered 2016-12-24: 300 mg via INTRAVENOUS

## 2016-12-24 MED ORDER — FENTANYL CITRATE (PF) 100 MCG/2ML IJ SOLN: 50.0000 ug | INTRAMUSCULAR | Status: DC | PRN

## 2016-12-24 MED ORDER — CEFAZOLIN SODIUM-DEXTROSE 2-4 GM/100ML-% IV SOLN
INTRAVENOUS | Status: AC
  Filled 2016-12-24: qty 100

## 2016-12-24 MED ORDER — CEFAZOLIN SODIUM-DEXTROSE 2-4 GM/100ML-% IV SOLN
2.0000 g | INTRAVENOUS | Status: AC
  Administered 2016-12-24: 2 g via INTRAVENOUS

## 2016-12-24 MED ORDER — LACTATED RINGERS IV SOLN
INTRAVENOUS | Status: DC
  Administered 2016-12-24 (×2): via INTRAVENOUS

## 2016-12-24 MED ORDER — ONDANSETRON HCL 4 MG/2ML IJ SOLN
INTRAMUSCULAR | Status: AC
  Filled 2016-12-24: qty 2

## 2016-12-24 MED ORDER — ONDANSETRON HCL 4 MG/2ML IJ SOLN
INTRAMUSCULAR | Status: DC | PRN
  Administered 2016-12-24: 4 mg via INTRAVENOUS

## 2016-12-24 MED ORDER — SCOPOLAMINE 1 MG/3DAYS TD PT72: 1.0000 | MEDICATED_PATCH | Freq: Once | TRANSDERMAL | Status: DC | PRN

## 2016-12-24 MED ORDER — BUPIVACAINE HCL (PF) 0.25 % IJ SOLN
INTRAMUSCULAR | Status: DC | PRN
  Administered 2016-12-24: 10 mL

## 2016-12-24 MED ORDER — DOXYCYCLINE HYCLATE 100 MG PO TABS: 100.0000 mg | ORAL_TABLET | Freq: Two times a day (BID) | ORAL | 0 refills | Status: AC

## 2016-12-24 MED ORDER — PHENYLEPHRINE 40 MCG/ML (10ML) SYRINGE FOR IV PUSH (FOR BLOOD PRESSURE SUPPORT)
PREFILLED_SYRINGE | INTRAVENOUS | Status: AC
  Filled 2016-12-24: qty 10

## 2016-12-24 MED ORDER — LIDOCAINE HCL (CARDIAC) 20 MG/ML IV SOLN
INTRAVENOUS | Status: DC | PRN
  Administered 2016-12-24: 30 mg via INTRAVENOUS

## 2016-12-24 MED ORDER — PHENYLEPHRINE HCL 10 MG/ML IJ SOLN
INTRAMUSCULAR | Status: DC | PRN
  Administered 2016-12-24 (×8): 80 ug via INTRAVENOUS

## 2016-12-24 MED ORDER — ACETAMINOPHEN 500 MG PO TABS
1000.0000 mg | ORAL_TABLET | ORAL | Status: AC
  Administered 2016-12-24: 1000 mg via ORAL

## 2016-12-24 MED ORDER — PHENYLEPHRINE 40 MCG/ML (10ML) SYRINGE FOR IV PUSH (FOR BLOOD PRESSURE SUPPORT)
PREFILLED_SYRINGE | INTRAVENOUS | Status: AC
  Filled 2016-12-24: qty 20

## 2016-12-24 MED ORDER — LIDOCAINE 2% (20 MG/ML) 5 ML SYRINGE
INTRAMUSCULAR | Status: AC
  Filled 2016-12-24: qty 5

## 2016-12-24 MED ORDER — ACETAMINOPHEN 500 MG PO TABS
ORAL_TABLET | ORAL | Status: AC
  Filled 2016-12-24: qty 2

## 2016-12-24 SURGICAL SUPPLY — 51 items
BLADE CLIPPER SURG (BLADE) IMPLANT
BLADE SURG 15 STRL LF DISP TIS (BLADE) ×1 IMPLANT
BLADE SURG 15 STRL SS (BLADE) ×2
BNDG COHESIVE 4X5 WHT NS (GAUZE/BANDAGES/DRESSINGS) IMPLANT
BNDG GAUZE ELAST 4 BULKY (GAUZE/BANDAGES/DRESSINGS) IMPLANT
CANISTER SUCT 1200ML W/VALVE (MISCELLANEOUS) IMPLANT
CHLORAPREP W/TINT 26ML (MISCELLANEOUS) ×3 IMPLANT
CLEANER CAUTERY TIP 5X5 PAD (MISCELLANEOUS) ×1 IMPLANT
CLOSURE WOUND 1/4X4 (GAUZE/BANDAGES/DRESSINGS)
COVER BACK TABLE 60X90IN (DRAPES) ×3 IMPLANT
COVER MAYO STAND STRL (DRAPES) ×3 IMPLANT
DECANTER SPIKE VIAL GLASS SM (MISCELLANEOUS) IMPLANT
DERMABOND ADVANCED (GAUZE/BANDAGES/DRESSINGS)
DERMABOND ADVANCED .7 DNX12 (GAUZE/BANDAGES/DRESSINGS) IMPLANT
DRAPE LAPAROTOMY 100X72 PEDS (DRAPES) ×3 IMPLANT
DRAPE LAPAROTOMY T 102X78X121 (DRAPES) IMPLANT
DRAPE UTILITY XL STRL (DRAPES) ×6 IMPLANT
DRSG TEGADERM 4X4.75 (GAUZE/BANDAGES/DRESSINGS) IMPLANT
ELECT REM PT RETURN 9FT ADLT (ELECTROSURGICAL) ×3
ELECTRODE REM PT RTRN 9FT ADLT (ELECTROSURGICAL) ×1 IMPLANT
GLOVE BIOGEL PI IND STRL 8 (GLOVE) ×1 IMPLANT
GLOVE BIOGEL PI INDICATOR 8 (GLOVE) ×2
GLOVE ECLIPSE 7.5 STRL STRAW (GLOVE) ×3 IMPLANT
GOWN STRL REUS W/ TWL LRG LVL3 (GOWN DISPOSABLE) ×1 IMPLANT
GOWN STRL REUS W/TWL LRG LVL3 (GOWN DISPOSABLE) ×2
NEEDLE HYPO 25X1 1.5 SAFETY (NEEDLE) ×3 IMPLANT
NS IRRIG 1000ML POUR BTL (IV SOLUTION) IMPLANT
PACK BASIN DAY SURGERY FS (CUSTOM PROCEDURE TRAY) ×3 IMPLANT
PAD CLEANER CAUTERY TIP 5X5 (MISCELLANEOUS) ×2
PENCIL BUTTON HOLSTER BLD 10FT (ELECTRODE) ×3 IMPLANT
SLEEVE SCD COMPRESS KNEE MED (MISCELLANEOUS) ×3 IMPLANT
SPONGE GAUZE 4X4 12PLY STER LF (GAUZE/BANDAGES/DRESSINGS) IMPLANT
STRIP CLOSURE SKIN 1/4X4 (GAUZE/BANDAGES/DRESSINGS) IMPLANT
SUCTION FRAZIER HANDLE 10FR (MISCELLANEOUS)
SUCTION TUBE FRAZIER 10FR DISP (MISCELLANEOUS) IMPLANT
SUT ETHILON 3 0 PS 1 (SUTURE) ×6 IMPLANT
SUT ETHILON 4 0 PS 2 18 (SUTURE) IMPLANT
SUT MON AB 4-0 PC3 18 (SUTURE) ×3 IMPLANT
SUT PROLENE 4 0 PS 2 18 (SUTURE) IMPLANT
SUT VIC AB 2-0 SH 27 (SUTURE)
SUT VIC AB 2-0 SH 27XBRD (SUTURE) IMPLANT
SUT VIC AB 4-0 SH 27 (SUTURE) ×2
SUT VIC AB 4-0 SH 27XANBCTRL (SUTURE) ×1 IMPLANT
SYR BULB 3OZ (MISCELLANEOUS) IMPLANT
SYR CONTROL 10ML LL (SYRINGE) ×3 IMPLANT
TOWEL OR 17X24 6PK STRL BLUE (TOWEL DISPOSABLE) ×3 IMPLANT
TOWEL OR NON WOVEN STRL DISP B (DISPOSABLE) IMPLANT
TUBE CONNECTING 20'X1/4 (TUBING)
TUBE CONNECTING 20X1/4 (TUBING) IMPLANT
UNDERPAD 30X30 (UNDERPADS AND DIAPERS) ×3 IMPLANT
YANKAUER SUCT BULB TIP NO VENT (SUCTIONS) IMPLANT

## 2016-12-24 NOTE — H&P (Signed)
  Nelda Severe Kennard 09/28/2016 10:56 AM Location: [facility] Patient #: [id number] DOB: [birth year] Married / Language: English / Race: White Male   The patient is a 61 year old male.  Allergies (Sonya Bynum, CMA; 09/28/2016 10:57 AM) Sulfa Antibiotics  Ambien *HYPNOTICS/SEDATIVES/SLEEP DISORDER AGENTS*  Septra *ANTI-INFECTIVE AGENTS - MISC.*   Medication History Marjean Donna, CMA; 09/28/2016 10:58 AM) LORazepam (1MG  Tablet, Oral) Active. LamoTRIgine (100MG  Tablet, Oral) Active. Levothyroxine Sodium (150MCG Tablet, Oral) Active. Losartan Potassium-HCTZ (100-25MG  Tablet, Oral) Active. Omeprazole (20MG  Capsule DR, Oral) Active. QUEtiapine Fumarate (400MG  Tablet, Oral) Active. Selegiline HCl (5MG  Tablet, Oral) Active. Cephalexin (500MG  Capsule, Oral) Active. Medications Reconciled  Vitals (Sonya Bynum CMA; 09/28/2016 10:57 AM) 09/28/2016 10:56 AM Weight: 259 lb Height: 72in Body Surface Area: 2.38 m Body Mass Index: 35.13 kg/m  Temp.: 97.42F(Temporal)  Pulse: 77 (Regular)  BP: 126/80 (Sitting, Left Arm, Standard) P today 116--patient is nervous BP 128/82  Physical Exam (Bralen O. Kristyne Woodring MD; 09/28/2016 11:35 AM) Breast, left chest wall, 2.9 cm healed incision from previously drained infected sebaceous cyst Lungs:  Clear to auscultation Cor. RRR, no murmurs. No adenopathy  Assessment & Plan Reily Scheuneman. Bryndon Cumbie MD; 09/28/2016 11:40 AM) INFECTED SEBACEOUS CYST (L72.3)--currently healed without evidence of continued infection.  No antibiotics recently  Impression: Left sub-mammary chest wall infected sebaceosu cyst--now healed and quiescent  . Patient is taking Keflex which he can continue untl culture results are available.  Current Plans:  OR for excision and closure  Pt Education - Skin Conditions: discussed with patient and provided information. Follow up with [place] in the office in 2 weeks. Postoperative antibiotics will probably not be  needed.  The patient has been seen in the office multiple times in preparation for surgery, knowing that there was a great likelihood or recurrence.  To be excised today  OR today for excision.  [name]. Dahlia Bailiff, MD, [place] [phone] [facility]

## 2016-12-24 NOTE — Op Note (Signed)
OPERATIVE REPORT  DATE OF OPERATION: 12/24/2016  PATIENT:  Reginald Tucker  62 y.o. male  PRE-OPERATIVE DIAGNOSIS:  SEBACEOUS CYST  POST-OPERATIVE DIAGNOSIS:  CHRONICALLY INFECTED SEBACEOUS CYST  INDICATION(S) FOR OPERATION:  Recurrent infection of left inframammary sebaceous cyst  FINDINGS:  Chronic infection with some intraoperative spillage of contents  PROCEDURE:  Procedure(s): EXCISION LEFT CHEST WALL SEBACEOUS CYST  SURGEON:  Surgeon(s): Judeth Horn, MD  ASSISTANT: None  ANESTHESIA:   general and LMA  COMPLICATIONS:  Spillage of contents with minimal contamination  EBL: <10 ml  BLOOD ADMINISTERED: none  DRAINS: none   SPECIMEN:  Source of Specimen:  Excised cyst  COUNTS CORRECT:  YES  PROCEDURE DETAILS: The patient was taken to the operating room and placed on the table in supine position. His left chest wall had been marked for proper side of the excision was subsequent LEEP prepped and draped in usual sterile manner.  A proper timeout was performed identifying the patient and the procedure to be performed.  The previously marked area was excised with a #15 blade. On the inferior flap of the incision the sebaceous cyst cavity was entered slightly but then covered so there was minimal spillage in the area. We extended the incision and then subsequently were able to excise circumferentially around to getting down to the deep portion of the cyst without any deep spillage.  Electrocautery was used to obtain adequate hemostasis. The cavity left after the excision was 7 cm x 2.5 cm x 2 cm deep.  We irrigated with saline solution. There was no pus. The skin edges were freshened with a #15 blade and we closed the wound. Deep subcutaneous layer was reapproximated using interrupted 4-0 Vicryl sutures. The skin was then closed using a combination of simple and horizontal mattress sutures of 3-0 nylon. Dermabond was placed on the skin in between and Steri-Strips applied between  the sutures. A Tegaderm dressing was applied on top of the closed wound. All needle counts, sponge counts, and instrument counts were correct.  PATIENT DISPOSITION:  PACU - hemodynamically stable.   Dalin Cissy Galbreath 1/15/20188:17 AM

## 2016-12-24 NOTE — Anesthesia Procedure Notes (Signed)
Procedure Name: LMA Insertion Date/Time: 12/24/2016 7:26 AM Performed by: Toula Moos L Pre-anesthesia Checklist: Patient identified, Emergency Drugs available, Suction available, Patient being monitored and Timeout performed Patient Re-evaluated:Patient Re-evaluated prior to inductionOxygen Delivery Method: Circle system utilized Preoxygenation: Pre-oxygenation with 100% oxygen Intubation Type: IV induction Ventilation: Mask ventilation without difficulty LMA: LMA inserted LMA Size: 4.0 Number of attempts: 1 Airway Equipment and Method: Bite block Placement Confirmation: positive ETCO2 Tube secured with: Tape Dental Injury: Teeth and Oropharynx as per pre-operative assessment

## 2016-12-24 NOTE — Transfer of Care (Signed)
Immediate Anesthesia Transfer of Care Note  Patient: Reginald Tucker  Procedure(s) Performed: Procedure(s): EXCISION LEFT CHEST WALL SEBACEOUS CYST (Left)  Patient Location: PACU  Anesthesia Type:General  Level of Consciousness: awake and patient cooperative  Airway & Oxygen Therapy: Patient Spontanous Breathing and Patient connected to face mask oxygen  Post-op Assessment: Report given to RN and Post -op Vital signs reviewed and stable  Post vital signs: Reviewed and stable  Last Vitals:  Vitals:   12/24/16 0648  BP: 128/82  Pulse: (!) 116  Resp: 18  Temp: 36.8 C    Last Pain:  Vitals:   12/24/16 0648  TempSrc: Axillary         Complications: No apparent anesthesia complications

## 2016-12-24 NOTE — Anesthesia Postprocedure Evaluation (Signed)
Anesthesia Post Note  Patient: Reginald Tucker  Procedure(s) Performed: Procedure(s) (LRB): EXCISION LEFT CHEST WALL SEBACEOUS CYST (Left)  Patient location during evaluation: PACU Anesthesia Type: General Level of consciousness: awake and alert Pain management: satisfactory to patient Vital Signs Assessment: post-procedure vital signs reviewed and stable Respiratory status: spontaneous breathing Cardiovascular status: stable Anesthetic complications: no       Last Vitals:  Vitals:   12/24/16 0900 12/24/16 0941  BP: 103/80 116/75  Pulse: 84 89  Resp: 18 16  Temp:  36.6 C    Last Pain:  Vitals:   12/24/16 0941  TempSrc: Oral  PainSc: 2                  Alean Kromer EDWARD

## 2016-12-24 NOTE — Discharge Instructions (Signed)
Epidermal Cyst Removal, Care After Introduction Refer to this sheet in the next few weeks. These instructions provide you with information about caring for yourself after your procedure. Your health care provider may also give you more specific instructions. Your treatment has been planned according to current medical practices, but problems sometimes occur. Call your health care provider if you have any problems or questions after your procedure. What can I expect after the procedure? After the procedure, it is common to have:  Soreness in the area where your cyst was removed.  Tightness or itching from your skin sutures. Follow these instructions at home:  Take medicines only as directed by your health care provider.  If you were prescribed an antibiotic medicine, finish all of it even if you start to feel better.  Use antibiotic ointment as directed by your health care provider. Follow the instructions carefully.  There are many different ways to close and cover an incision, including stitches (sutures), skin glue, and adhesive strips. Follow your health care provider's instructions about:  Incision care.  Bandage (dressing) changes and removal.  Incision closure removal.  Keep the bandage (dressing) dry until your health care provider says that it can be removed. Take sponge baths only. Ask your health care provider when you can start showering or taking a bath.  After your dressing is off, check your incision every day for signs of infection. Watch for:  Redness, swelling, or pain.  Fluid, blood, or pus.  You can return to your normal activities. Do not do anything that stretches or puts pressure on your incision.  You can return to your normal diet.  Keep all follow-up visits as directed by your health care provider. This is important. Contact a health care provider if:  You have a fever.  Your incision bleeds.  You have redness, swelling, or pain in the incision  area.  You have fluid, blood, or pus coming from your incision.  Your cyst comes back after surgery. This information is not intended to replace advice given to you by your health care provider. Make sure you discuss any questions you have with your health care provider. Document Released: 12/17/2014 Document Revised: 05/03/2016 Document Reviewed: 08/11/2014  2017 [employer]vier  Post Anesthesia Home Care Instructions  Activity: Get plenty of rest for the remainder of the day. A responsible adult should stay with you for 24 hours following the procedure.  For the next 24 hours, DO NOT: -Drive a car -Paediatric nurse -Drink alcoholic beverages -Take any medication unless instructed by your physician -Make any legal decisions or sign important papers.  Meals: Start with liquid foods such as gelatin or soup. Progress to regular foods as tolerated. Avoid greasy, spicy, heavy foods. If nausea and/or vomiting occur, drink only clear liquids until the nausea and/or vomiting subsides. Call your physician if vomiting continues.  Special Instructions/Symptoms: Your throat may feel dry or sore from the anesthesia or the breathing tube placed in your throat during surgery. If this causes discomfort, gargle with warm salt water. The discomfort should disappear within 24 hours.  If you had a scopolamine patch placed behind your ear for the management of post- operative nausea and/or vomiting:  1. The medication in the patch is effective for 72 hours, after which it should be removed.  Wrap patch in a tissue and discard in the trash. Wash hands thoroughly with soap and water. 2. You may remove the patch earlier than 72 hours if you experience unpleasant side effects which  may include dry mouth, dizziness or visual disturbances. 3. Avoid touching the patch. Wash your hands with soap and water after contact with the patch.

## 2016-12-24 NOTE — Anesthesia Postprocedure Evaluation (Signed)
Anesthesia Post Note  Patient: Reginald Tucker  Procedure(s) Performed: Procedure(s) (LRB): EXCISION LEFT CHEST WALL SEBACEOUS CYST (Left)  Patient location during evaluation: PACU Anesthesia Type: General Level of consciousness: awake and alert Pain management: satisfactory to patient Vital Signs Assessment: post-procedure vital signs reviewed and stable Respiratory status: spontaneous breathing Cardiovascular status: stable Anesthetic complications: no       Last Vitals:  Vitals:   12/24/16 0900 12/24/16 0941  BP: 103/80 116/75  Pulse: 84 89  Resp: 18 16  Temp:  36.6 C    Last Pain:  Vitals:   12/24/16 0941  TempSrc: Oral  PainSc: 2                  Demiyah Fischbach EDWARD

## 2016-12-25 ENCOUNTER — Encounter (HOSPITAL_BASED_OUTPATIENT_CLINIC_OR_DEPARTMENT_OTHER): Payer: Self-pay | Admitting: General Surgery

## 2017-01-18 DIAGNOSIS — F3132 Bipolar disorder, current episode depressed, moderate: Secondary | ICD-10-CM | POA: Diagnosis not present

## 2017-04-26 DIAGNOSIS — M2021 Hallux rigidus, right foot: Secondary | ICD-10-CM

## 2017-04-26 HISTORY — DX: Hallux rigidus, right foot: M20.21

## 2017-05-10 DIAGNOSIS — F3132 Bipolar disorder, current episode depressed, moderate: Secondary | ICD-10-CM | POA: Diagnosis not present

## 2017-05-16 DIAGNOSIS — H5211 Myopia, right eye: Secondary | ICD-10-CM | POA: Diagnosis not present

## 2017-05-16 DIAGNOSIS — H25813 Combined forms of age-related cataract, bilateral: Secondary | ICD-10-CM | POA: Diagnosis not present

## 2017-05-16 DIAGNOSIS — H5202 Hypermetropia, left eye: Secondary | ICD-10-CM | POA: Diagnosis not present

## 2017-05-16 DIAGNOSIS — H52223 Regular astigmatism, bilateral: Secondary | ICD-10-CM | POA: Diagnosis not present

## 2017-05-24 DIAGNOSIS — Z0000 Encounter for general adult medical examination without abnormal findings: Secondary | ICD-10-CM | POA: Diagnosis not present

## 2017-05-24 DIAGNOSIS — E559 Vitamin D deficiency, unspecified: Secondary | ICD-10-CM | POA: Diagnosis not present

## 2017-05-24 DIAGNOSIS — E039 Hypothyroidism, unspecified: Secondary | ICD-10-CM | POA: Diagnosis not present

## 2017-05-24 DIAGNOSIS — Z720 Tobacco use: Secondary | ICD-10-CM | POA: Diagnosis not present

## 2017-05-24 DIAGNOSIS — Z125 Encounter for screening for malignant neoplasm of prostate: Secondary | ICD-10-CM | POA: Diagnosis not present

## 2017-05-24 DIAGNOSIS — I10 Essential (primary) hypertension: Secondary | ICD-10-CM | POA: Diagnosis not present

## 2017-05-24 DIAGNOSIS — E782 Mixed hyperlipidemia: Secondary | ICD-10-CM | POA: Diagnosis not present

## 2017-05-24 DIAGNOSIS — Z1389 Encounter for screening for other disorder: Secondary | ICD-10-CM | POA: Diagnosis not present

## 2017-05-24 DIAGNOSIS — J449 Chronic obstructive pulmonary disease, unspecified: Secondary | ICD-10-CM | POA: Diagnosis not present

## 2017-05-24 DIAGNOSIS — R945 Abnormal results of liver function studies: Secondary | ICD-10-CM | POA: Diagnosis not present

## 2017-05-24 DIAGNOSIS — R7301 Impaired fasting glucose: Secondary | ICD-10-CM | POA: Diagnosis not present

## 2017-05-24 DIAGNOSIS — N289 Disorder of kidney and ureter, unspecified: Secondary | ICD-10-CM | POA: Diagnosis not present

## 2017-05-28 ENCOUNTER — Other Ambulatory Visit: Payer: Self-pay | Admitting: Family Medicine

## 2017-05-28 DIAGNOSIS — R945 Abnormal results of liver function studies: Principal | ICD-10-CM

## 2017-05-28 DIAGNOSIS — R7989 Other specified abnormal findings of blood chemistry: Secondary | ICD-10-CM

## 2017-05-29 DIAGNOSIS — M2021 Hallux rigidus, right foot: Secondary | ICD-10-CM | POA: Diagnosis not present

## 2017-06-04 ENCOUNTER — Ambulatory Visit
Admission: RE | Admit: 2017-06-04 | Discharge: 2017-06-04 | Disposition: A | Discharge status: Home / Self Care | Payer: PPO | Source: Ambulatory Visit | Attending: Family Medicine | Admitting: Family Medicine

## 2017-06-04 DIAGNOSIS — R7989 Other specified abnormal findings of blood chemistry: Secondary | ICD-10-CM

## 2017-06-04 DIAGNOSIS — R945 Abnormal results of liver function studies: Principal | ICD-10-CM

## 2017-06-05 DIAGNOSIS — F319 Bipolar disorder, unspecified: Secondary | ICD-10-CM | POA: Diagnosis not present

## 2017-06-05 DIAGNOSIS — J449 Chronic obstructive pulmonary disease, unspecified: Secondary | ICD-10-CM | POA: Diagnosis not present

## 2017-06-05 DIAGNOSIS — K219 Gastro-esophageal reflux disease without esophagitis: Secondary | ICD-10-CM | POA: Diagnosis not present

## 2017-06-05 DIAGNOSIS — M2021 Hallux rigidus, right foot: Secondary | ICD-10-CM | POA: Diagnosis not present

## 2017-06-05 DIAGNOSIS — F17200 Nicotine dependence, unspecified, uncomplicated: Secondary | ICD-10-CM | POA: Diagnosis not present

## 2017-06-05 DIAGNOSIS — E782 Mixed hyperlipidemia: Secondary | ICD-10-CM | POA: Diagnosis not present

## 2017-06-05 DIAGNOSIS — Z6835 Body mass index (BMI) 35.0-35.9, adult: Secondary | ICD-10-CM | POA: Diagnosis not present

## 2017-06-05 DIAGNOSIS — R0683 Snoring: Secondary | ICD-10-CM | POA: Diagnosis not present

## 2017-06-05 DIAGNOSIS — E039 Hypothyroidism, unspecified: Secondary | ICD-10-CM | POA: Diagnosis not present

## 2017-06-05 DIAGNOSIS — Z981 Arthrodesis status: Secondary | ICD-10-CM | POA: Diagnosis not present

## 2017-06-05 DIAGNOSIS — I10 Essential (primary) hypertension: Secondary | ICD-10-CM | POA: Diagnosis not present

## 2017-06-10 DIAGNOSIS — R531 Weakness: Secondary | ICD-10-CM | POA: Diagnosis not present

## 2017-06-10 DIAGNOSIS — E875 Hyperkalemia: Secondary | ICD-10-CM | POA: Diagnosis not present

## 2017-06-10 DIAGNOSIS — I10 Essential (primary) hypertension: Secondary | ICD-10-CM | POA: Diagnosis not present

## 2017-06-10 DIAGNOSIS — Z882 Allergy status to sulfonamides status: Secondary | ICD-10-CM | POA: Diagnosis not present

## 2017-06-10 DIAGNOSIS — F319 Bipolar disorder, unspecified: Secondary | ICD-10-CM | POA: Diagnosis not present

## 2017-06-10 DIAGNOSIS — R2689 Other abnormalities of gait and mobility: Secondary | ICD-10-CM | POA: Diagnosis not present

## 2017-06-10 DIAGNOSIS — M2021 Hallux rigidus, right foot: Secondary | ICD-10-CM | POA: Diagnosis not present

## 2017-06-10 DIAGNOSIS — M7989 Other specified soft tissue disorders: Secondary | ICD-10-CM | POA: Diagnosis not present

## 2017-06-10 DIAGNOSIS — M6281 Muscle weakness (generalized): Secondary | ICD-10-CM | POA: Diagnosis not present

## 2017-06-10 DIAGNOSIS — N183 Chronic kidney disease, stage 3 (moderate): Secondary | ICD-10-CM | POA: Diagnosis not present

## 2017-06-10 DIAGNOSIS — Z9889 Other specified postprocedural states: Secondary | ICD-10-CM | POA: Diagnosis not present

## 2017-06-10 DIAGNOSIS — F17200 Nicotine dependence, unspecified, uncomplicated: Secondary | ICD-10-CM | POA: Diagnosis not present

## 2017-06-10 DIAGNOSIS — N179 Acute kidney failure, unspecified: Secondary | ICD-10-CM | POA: Diagnosis not present

## 2017-06-10 DIAGNOSIS — I129 Hypertensive chronic kidney disease with stage 1 through stage 4 chronic kidney disease, or unspecified chronic kidney disease: Secondary | ICD-10-CM | POA: Diagnosis not present

## 2017-06-10 DIAGNOSIS — Z4889 Encounter for other specified surgical aftercare: Secondary | ICD-10-CM | POA: Diagnosis not present

## 2017-06-10 DIAGNOSIS — R410 Disorientation, unspecified: Secondary | ICD-10-CM | POA: Diagnosis not present

## 2017-06-10 DIAGNOSIS — R5383 Other fatigue: Secondary | ICD-10-CM | POA: Diagnosis not present

## 2017-06-10 DIAGNOSIS — R404 Transient alteration of awareness: Secondary | ICD-10-CM | POA: Diagnosis not present

## 2017-06-10 DIAGNOSIS — G4730 Sleep apnea, unspecified: Secondary | ICD-10-CM | POA: Diagnosis not present

## 2017-06-10 DIAGNOSIS — R2681 Unsteadiness on feet: Secondary | ICD-10-CM | POA: Diagnosis not present

## 2017-06-10 DIAGNOSIS — E039 Hypothyroidism, unspecified: Secondary | ICD-10-CM | POA: Diagnosis not present

## 2017-06-11 ENCOUNTER — Other Ambulatory Visit: Payer: Self-pay | Admitting: Family Medicine

## 2017-06-11 DIAGNOSIS — R932 Abnormal findings on diagnostic imaging of liver and biliary tract: Secondary | ICD-10-CM

## 2017-06-11 DIAGNOSIS — N179 Acute kidney failure, unspecified: Secondary | ICD-10-CM

## 2017-06-11 DIAGNOSIS — R7989 Other specified abnormal findings of blood chemistry: Secondary | ICD-10-CM

## 2017-06-11 DIAGNOSIS — R945 Abnormal results of liver function studies: Secondary | ICD-10-CM

## 2017-06-11 HISTORY — DX: Acute kidney failure, unspecified: N17.9

## 2017-06-14 DIAGNOSIS — M6281 Muscle weakness (generalized): Secondary | ICD-10-CM | POA: Diagnosis not present

## 2017-06-14 DIAGNOSIS — I10 Essential (primary) hypertension: Secondary | ICD-10-CM | POA: Diagnosis not present

## 2017-06-14 DIAGNOSIS — Z4889 Encounter for other specified surgical aftercare: Secondary | ICD-10-CM | POA: Diagnosis not present

## 2017-06-14 DIAGNOSIS — N183 Chronic kidney disease, stage 3 (moderate): Secondary | ICD-10-CM | POA: Diagnosis not present

## 2017-06-14 DIAGNOSIS — E039 Hypothyroidism, unspecified: Secondary | ICD-10-CM | POA: Diagnosis not present

## 2017-06-14 DIAGNOSIS — Z9889 Other specified postprocedural states: Secondary | ICD-10-CM | POA: Diagnosis not present

## 2017-06-14 DIAGNOSIS — I129 Hypertensive chronic kidney disease with stage 1 through stage 4 chronic kidney disease, or unspecified chronic kidney disease: Secondary | ICD-10-CM | POA: Diagnosis not present

## 2017-06-14 DIAGNOSIS — F419 Anxiety disorder, unspecified: Secondary | ICD-10-CM | POA: Diagnosis not present

## 2017-06-14 DIAGNOSIS — F29 Unspecified psychosis not due to a substance or known physiological condition: Secondary | ICD-10-CM | POA: Diagnosis not present

## 2017-06-14 DIAGNOSIS — M2021 Hallux rigidus, right foot: Secondary | ICD-10-CM | POA: Diagnosis not present

## 2017-06-14 DIAGNOSIS — E782 Mixed hyperlipidemia: Secondary | ICD-10-CM | POA: Diagnosis not present

## 2017-06-14 DIAGNOSIS — F39 Unspecified mood [affective] disorder: Secondary | ICD-10-CM | POA: Diagnosis not present

## 2017-06-14 DIAGNOSIS — E559 Vitamin D deficiency, unspecified: Secondary | ICD-10-CM | POA: Diagnosis not present

## 2017-06-14 DIAGNOSIS — R2681 Unsteadiness on feet: Secondary | ICD-10-CM | POA: Diagnosis not present

## 2017-06-14 DIAGNOSIS — E034 Atrophy of thyroid (acquired): Secondary | ICD-10-CM | POA: Diagnosis not present

## 2017-06-14 DIAGNOSIS — E875 Hyperkalemia: Secondary | ICD-10-CM | POA: Diagnosis not present

## 2017-06-14 DIAGNOSIS — F319 Bipolar disorder, unspecified: Secondary | ICD-10-CM | POA: Diagnosis not present

## 2017-06-14 DIAGNOSIS — N179 Acute kidney failure, unspecified: Secondary | ICD-10-CM | POA: Diagnosis not present

## 2017-06-14 DIAGNOSIS — R2689 Other abnormalities of gait and mobility: Secondary | ICD-10-CM | POA: Diagnosis not present

## 2017-06-18 ENCOUNTER — Non-Acute Institutional Stay (SKILLED_NURSING_FACILITY): Payer: PPO | Admitting: Internal Medicine

## 2017-06-18 ENCOUNTER — Encounter: Payer: Self-pay | Admitting: Internal Medicine

## 2017-06-18 DIAGNOSIS — E782 Mixed hyperlipidemia: Secondary | ICD-10-CM | POA: Insufficient documentation

## 2017-06-18 DIAGNOSIS — G4700 Insomnia, unspecified: Secondary | ICD-10-CM | POA: Insufficient documentation

## 2017-06-18 DIAGNOSIS — R0602 Shortness of breath: Secondary | ICD-10-CM | POA: Insufficient documentation

## 2017-06-18 DIAGNOSIS — N179 Acute kidney failure, unspecified: Secondary | ICD-10-CM

## 2017-06-18 DIAGNOSIS — M2021 Hallux rigidus, right foot: Secondary | ICD-10-CM

## 2017-06-18 DIAGNOSIS — Z860100 Personal history of colon polyps, unspecified: Secondary | ICD-10-CM | POA: Insufficient documentation

## 2017-06-18 DIAGNOSIS — I10 Essential (primary) hypertension: Secondary | ICD-10-CM

## 2017-06-18 DIAGNOSIS — E034 Atrophy of thyroid (acquired): Secondary | ICD-10-CM | POA: Diagnosis not present

## 2017-06-18 DIAGNOSIS — N183 Chronic kidney disease, stage 3 (moderate): Secondary | ICD-10-CM

## 2017-06-18 DIAGNOSIS — E875 Hyperkalemia: Secondary | ICD-10-CM | POA: Diagnosis not present

## 2017-06-18 DIAGNOSIS — Z9889 Other specified postprocedural states: Secondary | ICD-10-CM | POA: Diagnosis not present

## 2017-06-18 DIAGNOSIS — Z720 Tobacco use: Secondary | ICD-10-CM

## 2017-06-18 DIAGNOSIS — E559 Vitamin D deficiency, unspecified: Secondary | ICD-10-CM | POA: Insufficient documentation

## 2017-06-18 DIAGNOSIS — H9313 Tinnitus, bilateral: Secondary | ICD-10-CM

## 2017-06-18 DIAGNOSIS — F319 Bipolar disorder, unspecified: Secondary | ICD-10-CM

## 2017-06-18 DIAGNOSIS — N400 Enlarged prostate without lower urinary tract symptoms: Secondary | ICD-10-CM | POA: Insufficient documentation

## 2017-06-18 HISTORY — DX: Vitamin D deficiency, unspecified: E55.9

## 2017-06-18 HISTORY — DX: Mixed hyperlipidemia: E78.2

## 2017-06-18 HISTORY — DX: Tinnitus, bilateral: H93.13

## 2017-06-18 HISTORY — DX: Personal history of colonic polyps: Z86.010

## 2017-06-18 HISTORY — DX: Insomnia, unspecified: G47.00

## 2017-06-18 HISTORY — DX: Tobacco use: Z72.0

## 2017-06-18 NOTE — Progress Notes (Signed)
: Provider:  Noah Delaine. Sheppard Coil, MD Location:  [place] Room Number: [place] of Service:  SNF ([phone])  PCP: [name], MD Patient Care Team: [name], MD as PCP - General [facility] Medicine)  Extended Emergency Contact Information Primary Emergency Contact: [facility] Address: [address]          [place], [place] [address] Johnnette Litter of [place] Phone: [phone] Mobile Phone: [phone] Relation: Spouse     Allergies: Ambien [zolpidem tartrate]; Sulfa antibiotics; and Sulfamethoxazole  Chief Complaint  Patient presents with  . New Admit To SNF    following hospitalization 06/10/17 to 06/14/17 [facility] acute kidney injury    HPI: Patient is 61 y.o. male with Hypertension, bipolar disorder, hypothyroidism, who was admitted to [facility] from 7/2-6 status post right great toe fusion surgery done by podiatry. January be done as an outpatient and patient was having difficulty at home was unable to ambulate safely and his wife is struggling to care for him. His podiatrist sent him the hospital to expedite placement . Once he was at the hospital he was found to have acute kidney injury which improved with IV fluids and also had hyperkalemia which needed to be treated with Kayexalate and also was resolved. Patient's blood pressure was well controlled in the hospital and one of his medications Hyzaar was DC'd. Patient is admitted to skilled nursing facility for OT/PT while at skilled nursing facility patient will report for bipolar disorder treated with Lamictal, Risperdal and Seroquel hypothyroidism treated with levothyroxine and hyperlipidemia treated with Mevacor.  Past Medical History:  Diagnosis Date  . Acquired hallux rigidus of right foot 04/26/2017  . AKI (acute kidney injury) ([place]) 06/11/2017  . Anxiety   . Bipolar 1 disorder ([place])   . BPH (benign prostatic hyperplasia)   . Cataract   . Colon polyps   .  COPD GOLD II with restrictive component  01/13/2016   Spirometry 01/13/2016  FEV1 1.84 (47%)  Ratio 62  - 01/13/2016  extensive coaching HFA effectiveness =    90% > try stiolto respimat 2 pffs each am > did not benefit so stopped when sample out - 01/13/2016  Walked RA x 3 laps @ 185 ft each stopped due to  End of study, nl pace, no desat  / min sob  - PFT's  03/16/2016  FEV1 2.28 (59 % ) ratio 67  p 12 % improvement from saba p no prior to study with DLCO  66 % corrects to 86 % for alv volume     . Essential hypertension 01/19/2015  . Family history of coronary arteriosclerosis 01/19/2015   Father with MI   . GERD (gastroesophageal reflux disease) 08/11/2014  . History of colon polyps 06/18/2017  . Hypertension   . Hypothyroidism 08/11/2014  . Insomnia 06/18/2017  . Mixed hyperlipidemia 06/18/2017  . Morbid obesity ([place]) 02/09/8249   Complicated by HBP/ Low erv on pfts 03/16/2016 (31%)    . Shortness of breath 06/18/2017  . Thyroid disease   . Tinnitus of both ears 06/18/2017  . Tobacco use 06/18/2017  . Vitamin D deficiency 06/18/2017    Past Surgical History:  Procedure Laterality Date  . COLONOS    . COLONSCOPY    . CYST EXCISION  12/24/2016   sebaceous cyst chest wall  Dr. Hulen Skains  . MASS EXCISION Left 12/24/2016   Procedure: EXCISION LEFT CHEST WALL SEBACEOUS CYST;  Surgeon: Judeth Horn, MD;  Location: [facility];  Service: General;  Laterality: Left;    Allergies as of 06/18/2017      Reactions   Ambien [zolpidem Tartrate]    Sulfa Antibiotics    Sulfamethoxazole Other (See Comments)   Childhood allergy      Medication List       Accurate as of 06/18/17  9:50 AM. Always use your most recent med list.          lamoTRIgine 200 MG tablet Commonly known as:  LAMICTAL Take 200 mg by mouth. Take one tablet two times daily   levothyroxine 150 MCG tablet Commonly known as:  SYNTHROID, LEVOTHROID Take 150 mcg by mouth daily before breakfast.   LORazepam 1 MG tablet Commonly known  as:  ATIVAN Take 1 mg by mouth. Take one tablet every 8 hours as needed for anxiety   lovastatin 40 MG tablet Commonly known as:  MEVACOR Take 40 mg by mouth. Take one tablet daily   omeprazole 20 MG capsule Commonly known as:  PRILOSEC Take 20 mg by mouth daily.   polyethylene glycol packet Commonly known as:  MIRALAX / GLYCOLAX Take 17 g by mouth. Take once daily for 3 days   risperiDONE 2 MG tablet Commonly known as:  RISPERDAL Take 2 mg by mouth. Take one tablet at bedtime   selegiline 5 MG tablet Commonly known as:  ELDEPRYL Take 5 mg by mouth. Take one tablet twice daily with meals   SEROQUEL 400 MG tablet Generic drug:  QUEtiapine Take 400 mg by mouth at bedtime.       Meds ordered this encounter  Medications  . polyethylene glycol (MIRALAX / GLYCOLAX) packet    Sig: Take 17 g by mouth. Take once daily for 3 days  . LORazepam (ATIVAN) 1 MG tablet    Sig: Take 1 mg by mouth. Take one tablet every 8 hours as needed for anxiety  . selegiline (ELDEPRYL) 5 MG tablet    Sig: Take 5 mg by mouth. Take one tablet twice daily with meals  . lovastatin (MEVACOR) 40 MG tablet    Sig: Take 40 mg by mouth. Take one tablet daily  . risperiDONE (RISPERDAL) 2 MG tablet    Sig: Take 2 mg by mouth. Take one tablet at bedtime    Immunization History  Administered Date(s) Administered  . PPD Test 06/14/2017    Social History  Substance Use Topics  . Smoking status: Former Smoker    Packs/day: 1.00    Years: 25.00    Types: Cigarettes    Quit date: 12/10/2000  . Smokeless tobacco: Former Systems developer     Comment: heavy vape user  . Alcohol use No    Family history is   Family History  Problem Relation Age of Onset  . Cancer Mother   . Hyperlipidemia Father   . Hypertension Father   . CAD Father   . Stroke Father   . Hyperlipidemia Brother       Review of Systems  DATA OBTAINED: from patient,  family member GENERAL:  no fevers, fatigue, appetite changes SKIN: No  itching, or rash EYES: No eye pain, redness, discharge EARS: No earache, tinnitus, change in hearing NOSE: No congestion, drainage or bleeding  MOUTH/THROAT: No mouth or tooth pain, No sore throat RESPIRATORY: No cough, wheezing, SOB CARDIAC: No chest pain, palpitations, lower extremity edema  GI: No abdominal pain, No N/V/D or constipation, No heartburn or reflux  GU: No dysuria, frequency or urgency, or incontinence  MUSCULOSKELETAL: No unrelieved bone/joint pain NEUROLOGIC: No headache, dizziness or focal weakness PSYCHIATRIC: No c/o anxiety or sadness   Vitals:   06/18/17 0924  BP: 104/63  Pulse: 88  Resp: 16  Temp: 98 F (36.7 C)    SpO2 Readings from Last 1 Encounters:  06/18/17 93%   Body mass index is 35.26 kg/m.     Physical Exam  GENERAL APPEARANCE: Alert, conversant,  No acute distress.  SKIN: No diaphoresis rash HEAD: Normocephalic, atraumatic  EYES: Conjunctiva/lids clear. Pupils round, reactive. EOMs intact.  EARS: External exam WNL, canals clear. Hearing grossly normal.  NOSE: No deformity or discharge.  MOUTH/THROAT: Lips w/o lesions  RESPIRATORY: Breathing is even, unlabored. Lung sounds are clear   CARDIOVASCULAR: Heart RRR no murmurs, rubs or gallops. No peripheral edema.   GASTROINTESTINAL: Abdomen is soft, non-tender, not distended w/ normal bowel sounds. GENITOURINARY: Bladder non tender, not distended  MUSCULOSKELETAL: caatr leg; toes wil nl color and no edema NEUROLOGIC:  Cranial nerves 2-12 grossly intact. Moves all extremities  PSYCHIATRIC: Mood and affect appropriate to situation, no behavioral issues  Patient Active Problem List   Diagnosis Date Noted  . BPH (benign prostatic hyperplasia) 06/18/2017  . History of colon polyps 06/18/2017  . Insomnia 06/18/2017  . Mixed hyperlipidemia 06/18/2017  . Shortness of breath 06/18/2017  . Tinnitus of both ears 06/18/2017  . Tobacco use 06/18/2017  . Vitamin D deficiency 06/18/2017  . AKI  (acute kidney injury) ([place]) 06/11/2017  . Acquired hallux rigidus of right foot 04/26/2017  . Morbid obesity ([place]) 03/18/2016  . COPD GOLD II with restrictive component  01/13/2016  . Family history of coronary arteriosclerosis 01/19/2015  . Essential hypertension 01/19/2015  . Bipolar 1 disorder ([place]) 08/11/2014  . GERD (gastroesophageal reflux disease) 08/11/2014  . Hypothyroidism 08/11/2014      Labs reviewed: Basic Metabolic Panel:    Component Value Date/Time   NA 142 12/19/2016 1204   K 4.7 12/19/2016 1204   CL 109 12/19/2016 1204   CO2 25 12/19/2016 1204   GLUCOSE 111 (H) 12/19/2016 1204   BUN 29 (H) 12/19/2016 1204   CREATININE 1.38 (H) 12/19/2016 1204   CALCIUM 9.2 12/19/2016 1204   GFRNONAA 53 (L) 12/19/2016 1204   GFRAA >60 12/19/2016 1204     Recent Labs  12/19/16 1204  NA 142  K 4.7  CL 109  CO2 25  GLUCOSE 111*  BUN 29*  CREATININE 1.38*  CALCIUM 9.2   Liver Function Tests: No results for input(s): AST, ALT, ALKPHOS, BILITOT, PROT, ALBUMIN in the last 8760 hours. No results for input(s): LIPASE, AMYLASE in the last 8760 hours. No results for input(s): AMMONIA in the last 8760 hours. CBC:  Recent Labs  12/19/16 1204  WBC 4.8  NEUTROABS 2.9  HGB 14.9  HCT 43.9  MCV 91.6  PLT 213   Lipid No results for input(s): CHOL, HDL, LDLCALC, TRIG in the last 8760 hours.  Cardiac Enzymes: No results for input(s): CKTOTAL, CKMB, CKMBINDEX, TROPONINI in the last 8760 hours. BNP: No results for input(s): BNP in the last 8760 hours. No results found for: MICROALBUR No results found for: HGBA1C No results found for: TSH No results found for: VITAMINB12 No results found for: FOLATE No results found for: IRON, TIBC, FERRITIN  Imaging and Procedures obtained prior to SNF admission: US Abdomen Limited Ruq  Result Date: 06/04/2017 CLINICAL DATA:  Elevated liver function studies EXAM: ULTRASOUND ABDOMEN LIMITED RIGHT UPPER QUADRANT COMPARISON:   Abdominopelvic CT scan  of February 13, 2009 FINDINGS: Gallbladder: No gallstones or wall thickening visualized. No sonographic Murphy sign noted by sonographer. Common bile duct: Diameter: 4.8 mm Liver: These images are quite limited due to the patient's body habitus and limited penetration of the organ by the ultrasound beam. The echotexture is increased diffusely. No definite mass or ductal dilation is observed. Portal venous flow could not be demonstrated. IMPRESSION: Limited evaluation of the liver. Increased hepatic echotexture may reflect fatty infiltrative change but other hepatocellular infiltrative processes including cirrhosis could produce similar findings. Given the elevated liver function studies, hepatic protocol MRI is recommended. Normal appearing gallbladder and common bile duct. Electronically Signed   By: David  Martinique M.D.   On: 06/04/2017 12:22     Not all labs, radiology exams or other studies done during hospitalization come through on my EPIC note; however they are reviewed by me.    Assessment and Plan  RIGHT GREAT TOE [facility] podiatry several days prior to admission. Due to deconditioning and difficulty ambulating patient had a Dopplers done on my evaluation to which was negative; patient needs to be admitted to a skilled nursing facility for OT PT SNF-admitted for OT/PT  ACUTE ON CKD3-improvement with IV hydration. Creatinine was 2.43 on admission and on discharge is 1.25 SNF -follow-up BMP  HYPERKALEMIA-treated with Kayexalate, diet change to low potassium SNF-will continue low potassium diet; follow up BMP  HYPERTENSION-was controlled in the hospital without medications; patient being discharged on no meds for high blood pressure SNF-will be obtaining blood pressures every shift will; will restart medication as needed  HYPOTHYROIDISM SNF _continue Synthroid 150 g by mouth daily  BIPOLAR DISORDER-patient's medication selegiline was decreased to 5 mg twice a day  as per pharmacy recommendations; patient's mood was good; patient is to follow-up with his psychiatrist in 1-2 weeks  SNF - appears stable continue Eldepryl 5 mg by mouth twice a day, Risperdal 2 mg by mouth daily at bedtime and Lamictal 100 mg by mouth twice a day; patient is also from his psychiatrist on L3 creatinine 100 mg a day and methyl cobalamin 1 mg a day-I have told patient that he will need to bring these from home and we will keep them in the cart and he can have them and he they will be returning to him when he is discharged  [facility] SNF - will continue Mevacor 40 mg daily; wife says the patient is also on super omega-3 2000 mg twice a day-will start this as well  VITAMIN D DEFICIENCY SNF - wife states the patient is also got a vitamin D deficiency and should be on vitamin D3 5000 units daily-will start this   Time spent > 45 min;> 50% of time with patient was spent reviewing records, labs, tests and studies, counseling and developing plan of care  Webb Silversmith D. Sheppard Coil, MD

## 2017-06-20 ENCOUNTER — Encounter: Payer: Self-pay | Admitting: Internal Medicine

## 2017-06-20 DIAGNOSIS — F319 Bipolar disorder, unspecified: Secondary | ICD-10-CM | POA: Diagnosis not present

## 2017-06-20 DIAGNOSIS — E875 Hyperkalemia: Secondary | ICD-10-CM | POA: Insufficient documentation

## 2017-06-20 DIAGNOSIS — N183 Chronic kidney disease, stage 3 unspecified: Secondary | ICD-10-CM | POA: Insufficient documentation

## 2017-06-20 DIAGNOSIS — F39 Unspecified mood [affective] disorder: Secondary | ICD-10-CM | POA: Diagnosis not present

## 2017-06-20 DIAGNOSIS — N179 Acute kidney failure, unspecified: Secondary | ICD-10-CM | POA: Insufficient documentation

## 2017-06-20 DIAGNOSIS — F419 Anxiety disorder, unspecified: Secondary | ICD-10-CM | POA: Diagnosis not present

## 2017-06-20 DIAGNOSIS — Z9889 Other specified postprocedural states: Secondary | ICD-10-CM | POA: Insufficient documentation

## 2017-06-20 DIAGNOSIS — F29 Unspecified psychosis not due to a substance or known physiological condition: Secondary | ICD-10-CM | POA: Diagnosis not present

## 2017-06-26 DIAGNOSIS — M2021 Hallux rigidus, right foot: Secondary | ICD-10-CM | POA: Diagnosis not present

## 2017-07-02 ENCOUNTER — Other Ambulatory Visit: Payer: Self-pay | Admitting: *Deleted

## 2017-07-02 NOTE — Patient Outreach (Signed)
[facility]) Care Management  07/02/2017  Reginald Tucker [birth year] [id number]   Met with patient at facility, patient discharging later this week.  He is unsure if he will have home care, he is now in a boot and doing better.  He states he and his wife will put some bars up in the bathroom for safety.   RNCM reviewed [facility] program and left information for furture reference.  Patient does not feel he has any [facility] care management needs at this time but appreciates  Hearing about the program.   Plan to sign off Stanton Kidney E. Laymond Purser, RN, BSN, [place] [phone]) Business Cell  [phone]) Toll Free Office

## 2017-07-04 ENCOUNTER — Encounter: Payer: Self-pay | Admitting: Internal Medicine

## 2017-07-04 ENCOUNTER — Non-Acute Institutional Stay (SKILLED_NURSING_FACILITY): Payer: PPO | Admitting: Internal Medicine

## 2017-07-04 DIAGNOSIS — N179 Acute kidney failure, unspecified: Secondary | ICD-10-CM

## 2017-07-04 DIAGNOSIS — M2021 Hallux rigidus, right foot: Secondary | ICD-10-CM

## 2017-07-04 DIAGNOSIS — F319 Bipolar disorder, unspecified: Secondary | ICD-10-CM

## 2017-07-04 DIAGNOSIS — I10 Essential (primary) hypertension: Secondary | ICD-10-CM | POA: Diagnosis not present

## 2017-07-04 DIAGNOSIS — E559 Vitamin D deficiency, unspecified: Secondary | ICD-10-CM | POA: Diagnosis not present

## 2017-07-04 DIAGNOSIS — N183 Chronic kidney disease, stage 3 (moderate): Secondary | ICD-10-CM | POA: Diagnosis not present

## 2017-07-04 DIAGNOSIS — E034 Atrophy of thyroid (acquired): Secondary | ICD-10-CM | POA: Diagnosis not present

## 2017-07-04 DIAGNOSIS — E875 Hyperkalemia: Secondary | ICD-10-CM | POA: Diagnosis not present

## 2017-07-04 DIAGNOSIS — E782 Mixed hyperlipidemia: Secondary | ICD-10-CM | POA: Diagnosis not present

## 2017-07-04 NOTE — Progress Notes (Signed)
Location:  [facility] Room Number: [place]:  SNF [phone]) Provider: Noah Delaine. Sheppard Coil, MD PCP: [name], MD Patient Care Team: [name], MD as PCP - General [facility] Medicine)  Extended Emergency Contact Information Primary Emergency Contact: [facility] Address: [address]          [place], [place] [address] Johnnette Litter of [place] Phone: [phone] Mobile Phone: [phone] Relation: Spouse  Allergies  Allergen Reactions  . Ambien [Zolpidem Tartrate]   . Sulfa Antibiotics   . Sulfamethoxazole Other (See Comments)    Childhood allergy    Chief Complaint  Patient presents with  . Discharge Note    discharge from SNF to home    HPI:  61 y.o. male   with Hypertension, bipolar disorder, hypothyroidism, who was admitted to [facility] from 7/2-6 status post right great toe fusion surgery done by podiatry. January be done as an outpatient and patient was having difficulty at home was unable to ambulate safely and his wife is struggling to care for him. His podiatrist sent him the hospital to expedite placement . Once he was at the hospital he was found to have acute kidney injury which improved with IV fluids and also had hyperkalemia which needed to be treated with Kayexalate and also was resolved. Patient's blood pressure was well controlled in the hospital and one of his medications Hyzaar was DC'd. Patient is admitted to skilled nursing facility for OT/PT. Pt is now ready to be d/c.    Past Medical History:  Diagnosis Date  . Acquired hallux rigidus of right foot 04/26/2017  . AKI (acute kidney injury) ([place]) 06/11/2017  . Anxiety   . Bipolar 1 disorder ([place])   . BPH (benign prostatic hyperplasia)   . Cataract   . Colon polyps   . COPD GOLD II with restrictive component  01/13/2016   Spirometry 01/13/2016  FEV1 1.84 (47%)  Ratio 62  - 01/13/2016  extensive coaching HFA effectiveness =    90% > try  stiolto respimat 2 pffs each am > did not benefit so stopped when sample out - 01/13/2016  Walked RA x 3 laps @ 185 ft each stopped due to  End of study, nl pace, no desat  / min sob  - PFT's  03/16/2016  FEV1 2.28 (59 % ) ratio 67  p 12 % improvement from saba p no prior to study with DLCO  66 % corrects to 86 % for alv volume     . Essential hypertension 01/19/2015  . Family history of coronary arteriosclerosis 01/19/2015   Father with MI   . GERD (gastroesophageal reflux disease) 08/11/2014  . History of colon polyps 06/18/2017  . Hypertension   . Hypothyroidism 08/11/2014  . Insomnia 06/18/2017  . Mixed hyperlipidemia 06/18/2017  . Morbid obesity ([place]) 02/16/7672   Complicated by HBP/ Low erv on pfts 03/16/2016 (31%)    . Shortness of breath 06/18/2017  . Thyroid disease   . Tinnitus of both ears 06/18/2017  . Tobacco use 06/18/2017  . Vitamin D deficiency 06/18/2017    Past Surgical History:  Procedure Laterality Date  . COLONOS    . COLONSCOPY    . CYST EXCISION  12/24/2016   sebaceous cyst chest wall  Dr. Hulen Skains  . MASS EXCISION Left 12/24/2016   Procedure: EXCISION LEFT CHEST WALL SEBACEOUS CYST;  Surgeon: Judeth Horn, MD;  Location: [facility];  Service: General;  Laterality: Left;  reports that he quit smoking about 16 years ago. His smoking use included Cigarettes. He has a 25.00 pack-year smoking history. He has quit using smokeless tobacco. He reports that he does not drink alcohol or use drugs. Social History   Social History  . Marital status: Married    Spouse name: N/A  . Number of children: N/A  . Years of education: N/A   Occupational History  . [employer]  assist    Social History Main Topics  . Smoking status: Former Smoker    Packs/day: 1.00    Years: 25.00    Types: Cigarettes    Quit date: 12/10/2000  . Smokeless tobacco: Former Systems developer     Comment: heavy vape user  . Alcohol use No  . Drug use: No  . Sexual activity: Not on file   Other Topics Concern  .  Not on file   Social History Narrative   Admitted to [facility] 06/14/17   Married - [name]   Former smoker - stopped 2002   Alcohol none   Full code    Pertinent  Health Maintenance Due  Topic Date Due  . COLONOSCOPY  05/18/2004  . INFLUENZA VACCINE  07/10/2017    Medications: Allergies as of 07/04/2017      Reactions   Ambien [zolpidem Tartrate]    Sulfa Antibiotics    Sulfamethoxazole Other (See Comments)   Childhood allergy      Medication List       Accurate as of 07/04/17  1:12 PM. Always use your most recent med list.          lamoTRIgine 200 MG tablet Commonly known as:  LAMICTAL Take 200 mg by mouth. Take one tablet two times daily   levothyroxine 150 MCG tablet Commonly known as:  SYNTHROID, LEVOTHROID Take 150 mcg by mouth daily before breakfast.   LORazepam 1 MG tablet Commonly known as:  ATIVAN Take 1 mg by mouth. Take one tablet every 8 hours as needed for anxiety   lovastatin 40 MG tablet Commonly known as:  MEVACOR Take 40 mg by mouth. Take one tablet daily   omeprazole 20 MG capsule Commonly known as:  PRILOSEC Take 20 mg by mouth daily.   risperiDONE 2 MG tablet Commonly known as:  RISPERDAL Take 2 mg by mouth. Take one tablet at bedtime   selegiline 5 MG tablet Commonly known as:  ELDEPRYL Take 5 mg by mouth. Take one tablet twice daily with meals   SEROQUEL 400 MG tablet Generic drug:  QUEtiapine Take 400 mg by mouth at bedtime.        Vitals:   07/04/17 1007  BP: (!) 161/93  Pulse: 83  Resp: (!) 22  Temp: (!) 96.8 F (36 C)  Weight: 273 lb (123.8 kg)  Height: 6' (1.829 m)   Body mass index is 37.03 kg/m.  Physical Exam  GENERAL APPEARANCE: Alert, conversant. No acute distress.  HEENT: Unremarkable. RESPIRATORY: Breathing is even, unlabored. Lung sounds are clear   CARDIOVASCULAR: Heart RRR no murmurs, rubs or gallops. No peripheral edema.  GASTROINTESTINAL: Abdomen is soft, non-tender, not distended w/ normal  bowel sounds.  NEUROLOGIC: Cranial nerves 2-12 grossly intact. Moves all extremities   Labs reviewed: Basic Metabolic Panel:  Recent Labs  12/19/16 1204  NA 142  K 4.7  CL 109  CO2 25  GLUCOSE 111*  BUN 29*  CREATININE 1.38*  CALCIUM 9.2   No results found for: [facility] Liver Function Tests: No results for  input(s): AST, ALT, ALKPHOS, BILITOT, PROT, ALBUMIN in the last 8760 hours. No results for input(s): LIPASE, AMYLASE in the last 8760 hours. No results for input(s): AMMONIA in the last 8760 hours. CBC:  Recent Labs  12/19/16 1204  WBC 4.8  NEUTROABS 2.9  HGB 14.9  HCT 43.9  MCV 91.6  PLT 213   Lipid No results for input(s): CHOL, HDL, LDLCALC, TRIG in the last 8760 hours. Cardiac Enzymes: No results for input(s): CKTOTAL, CKMB, CKMBINDEX, TROPONINI in the last 8760 hours. BNP: No results for input(s): BNP in the last 8760 hours. CBG: No results for input(s): GLUCAP in the last 8760 hours.  Procedures and Imaging Studies During Stay: No results found.  Assessment/Plan:   Acquired hallux rigidus of right foot  Acute renal failure superimposed on stage 3 chronic kidney disease, unspecified acute renal failure type ([place])  Essential hypertension  Hypothyroidism due to acquired atrophy of thyroid  Hyperkalemia  Mixed hyperlipidemia  Bipolar 1 disorder ([place])  Vitamin D deficiency   Patient is being discharged with the following home health services:  PT/OT  Patient is being discharged with the following durable medical equipment:  none  Patient has been advised to f/u with their PCP in 1-2 weeks to bring them up to date on their rehab stay.  Social services at facility was responsible for arranging this appointment.  Pt was provided with a 30 day supply of prescriptions for medications and refills must be obtained from their PCP.  For controlled substances, a more limited supply may be provided adequate until PCP appointment only.  Medications  have been reconciled.  Time spent greater than 30 minutes;> 50% of time with patient was spent reviewing records, labs, tests and studies, counseling and developing plan of care  Noah Delaine. Sheppard Coil, MD

## 2017-07-07 DIAGNOSIS — M2021 Hallux rigidus, right foot: Secondary | ICD-10-CM | POA: Diagnosis not present

## 2017-07-07 DIAGNOSIS — E039 Hypothyroidism, unspecified: Secondary | ICD-10-CM | POA: Diagnosis not present

## 2017-07-07 DIAGNOSIS — F17210 Nicotine dependence, cigarettes, uncomplicated: Secondary | ICD-10-CM | POA: Diagnosis not present

## 2017-07-07 DIAGNOSIS — N183 Chronic kidney disease, stage 3 (moderate): Secondary | ICD-10-CM | POA: Diagnosis not present

## 2017-07-07 DIAGNOSIS — F319 Bipolar disorder, unspecified: Secondary | ICD-10-CM | POA: Diagnosis not present

## 2017-07-07 DIAGNOSIS — I129 Hypertensive chronic kidney disease with stage 1 through stage 4 chronic kidney disease, or unspecified chronic kidney disease: Secondary | ICD-10-CM | POA: Diagnosis not present

## 2017-07-07 DIAGNOSIS — E785 Hyperlipidemia, unspecified: Secondary | ICD-10-CM | POA: Diagnosis not present

## 2017-07-07 DIAGNOSIS — F419 Anxiety disorder, unspecified: Secondary | ICD-10-CM | POA: Diagnosis not present

## 2017-07-07 DIAGNOSIS — J449 Chronic obstructive pulmonary disease, unspecified: Secondary | ICD-10-CM | POA: Diagnosis not present

## 2017-07-12 DIAGNOSIS — E039 Hypothyroidism, unspecified: Secondary | ICD-10-CM | POA: Diagnosis not present

## 2017-07-12 DIAGNOSIS — R7303 Prediabetes: Secondary | ICD-10-CM | POA: Diagnosis not present

## 2017-07-12 DIAGNOSIS — F319 Bipolar disorder, unspecified: Secondary | ICD-10-CM | POA: Diagnosis not present

## 2017-07-12 DIAGNOSIS — Z9889 Other specified postprocedural states: Secondary | ICD-10-CM | POA: Diagnosis not present

## 2017-07-12 DIAGNOSIS — I10 Essential (primary) hypertension: Secondary | ICD-10-CM | POA: Diagnosis not present

## 2017-07-12 DIAGNOSIS — E782 Mixed hyperlipidemia: Secondary | ICD-10-CM | POA: Diagnosis not present

## 2017-07-18 ENCOUNTER — Other Ambulatory Visit: Payer: PPO

## 2017-07-27 ENCOUNTER — Other Ambulatory Visit: Payer: Self-pay | Admitting: Family Medicine

## 2017-07-27 ENCOUNTER — Ambulatory Visit
Admission: RE | Admit: 2017-07-27 | Discharge: 2017-07-27 | Disposition: A | Discharge status: Home / Self Care | Payer: PPO | Source: Ambulatory Visit | Attending: Family Medicine | Admitting: Family Medicine

## 2017-07-27 DIAGNOSIS — M795 Residual foreign body in soft tissue: Secondary | ICD-10-CM

## 2017-07-27 DIAGNOSIS — R932 Abnormal findings on diagnostic imaging of liver and biliary tract: Secondary | ICD-10-CM

## 2017-07-27 DIAGNOSIS — R7989 Other specified abnormal findings of blood chemistry: Secondary | ICD-10-CM

## 2017-07-27 DIAGNOSIS — R945 Abnormal results of liver function studies: Secondary | ICD-10-CM

## 2017-08-02 DIAGNOSIS — F319 Bipolar disorder, unspecified: Secondary | ICD-10-CM | POA: Diagnosis not present

## 2017-08-02 DIAGNOSIS — I129 Hypertensive chronic kidney disease with stage 1 through stage 4 chronic kidney disease, or unspecified chronic kidney disease: Secondary | ICD-10-CM | POA: Diagnosis not present

## 2017-08-02 DIAGNOSIS — N183 Chronic kidney disease, stage 3 (moderate): Secondary | ICD-10-CM | POA: Diagnosis not present

## 2017-08-02 DIAGNOSIS — J449 Chronic obstructive pulmonary disease, unspecified: Secondary | ICD-10-CM | POA: Diagnosis not present

## 2017-08-02 DIAGNOSIS — E039 Hypothyroidism, unspecified: Secondary | ICD-10-CM | POA: Diagnosis not present

## 2017-08-02 DIAGNOSIS — E785 Hyperlipidemia, unspecified: Secondary | ICD-10-CM | POA: Diagnosis not present

## 2017-08-02 DIAGNOSIS — F419 Anxiety disorder, unspecified: Secondary | ICD-10-CM | POA: Diagnosis not present

## 2017-08-02 DIAGNOSIS — M2021 Hallux rigidus, right foot: Secondary | ICD-10-CM | POA: Diagnosis not present

## 2017-08-02 DIAGNOSIS — F17210 Nicotine dependence, cigarettes, uncomplicated: Secondary | ICD-10-CM | POA: Diagnosis not present

## 2017-08-08 ENCOUNTER — Ambulatory Visit
Admission: RE | Admit: 2017-08-08 | Discharge: 2017-08-08 | Disposition: A | Discharge status: Home / Self Care | Payer: PPO | Source: Ambulatory Visit | Attending: Family Medicine | Admitting: Family Medicine

## 2017-08-08 ENCOUNTER — Other Ambulatory Visit: Payer: Self-pay | Admitting: Family Medicine

## 2017-08-08 DIAGNOSIS — R932 Abnormal findings on diagnostic imaging of liver and biliary tract: Secondary | ICD-10-CM

## 2017-08-08 DIAGNOSIS — M795 Residual foreign body in soft tissue: Secondary | ICD-10-CM

## 2017-08-08 DIAGNOSIS — Z01818 Encounter for other preprocedural examination: Secondary | ICD-10-CM | POA: Diagnosis not present

## 2017-08-08 DIAGNOSIS — R945 Abnormal results of liver function studies: Secondary | ICD-10-CM

## 2017-08-08 DIAGNOSIS — R7989 Other specified abnormal findings of blood chemistry: Secondary | ICD-10-CM

## 2017-08-14 ENCOUNTER — Other Ambulatory Visit (HOSPITAL_COMMUNITY): Payer: Self-pay | Admitting: Family Medicine

## 2017-08-14 DIAGNOSIS — F3132 Bipolar disorder, current episode depressed, moderate: Secondary | ICD-10-CM | POA: Diagnosis not present

## 2017-08-14 DIAGNOSIS — R7989 Other specified abnormal findings of blood chemistry: Secondary | ICD-10-CM

## 2017-08-14 DIAGNOSIS — R945 Abnormal results of liver function studies: Secondary | ICD-10-CM

## 2017-08-14 DIAGNOSIS — R932 Abnormal findings on diagnostic imaging of liver and biliary tract: Secondary | ICD-10-CM

## 2017-08-15 DIAGNOSIS — H25043 Posterior subcapsular polar age-related cataract, bilateral: Secondary | ICD-10-CM | POA: Diagnosis not present

## 2017-09-02 ENCOUNTER — Other Ambulatory Visit: Payer: Self-pay | Admitting: Internal Medicine

## 2017-09-21 NOTE — Progress Notes (Addendum)
Family states that insurance will not cover anesthesia for MRI. They report trying Open MRI at [facility]. I recommended they try [place]. Requested that they contact doctors office to cancel, however they state doctors office has been without power and may not be open on Monday. I contacted MRI and requested they cancel MRI for Tuesday. I contacted OR Control Desk to cancel for anesthesia.

## 2017-09-24 ENCOUNTER — Ambulatory Visit (HOSPITAL_COMMUNITY): Admission: RE | Admit: 2017-09-24 | Payer: PPO | Source: Ambulatory Visit

## 2017-09-24 ENCOUNTER — Ambulatory Visit (HOSPITAL_COMMUNITY): Payer: PPO

## 2017-09-24 ENCOUNTER — Encounter (HOSPITAL_COMMUNITY): Admission: RE | Payer: Self-pay | Source: Ambulatory Visit

## 2017-09-24 ENCOUNTER — Encounter (HOSPITAL_COMMUNITY): Payer: Self-pay

## 2017-09-24 SURGERY — RADIOLOGY WITH ANESTHESIA
Anesthesia: General

## 2017-09-26 ENCOUNTER — Other Ambulatory Visit: Payer: Self-pay | Admitting: Family Medicine

## 2017-09-26 DIAGNOSIS — R945 Abnormal results of liver function studies: Principal | ICD-10-CM

## 2017-09-26 DIAGNOSIS — R935 Abnormal findings on diagnostic imaging of other abdominal regions, including retroperitoneum: Secondary | ICD-10-CM

## 2017-09-26 DIAGNOSIS — R7989 Other specified abnormal findings of blood chemistry: Secondary | ICD-10-CM

## 2017-10-07 ENCOUNTER — Other Ambulatory Visit: Payer: Self-pay | Admitting: Internal Medicine

## 2017-10-09 DIAGNOSIS — F3132 Bipolar disorder, current episode depressed, moderate: Secondary | ICD-10-CM | POA: Diagnosis not present

## 2017-10-23 DIAGNOSIS — F3132 Bipolar disorder, current episode depressed, moderate: Secondary | ICD-10-CM | POA: Diagnosis not present

## 2017-11-07 ENCOUNTER — Other Ambulatory Visit: Payer: Self-pay | Admitting: Family Medicine

## 2017-11-07 DIAGNOSIS — R932 Abnormal findings on diagnostic imaging of liver and biliary tract: Secondary | ICD-10-CM

## 2017-11-12 DIAGNOSIS — I10 Essential (primary) hypertension: Secondary | ICD-10-CM | POA: Diagnosis not present

## 2017-11-12 DIAGNOSIS — N183 Chronic kidney disease, stage 3 (moderate): Secondary | ICD-10-CM | POA: Diagnosis not present

## 2017-11-12 DIAGNOSIS — E785 Hyperlipidemia, unspecified: Secondary | ICD-10-CM | POA: Diagnosis not present

## 2017-11-12 DIAGNOSIS — E039 Hypothyroidism, unspecified: Secondary | ICD-10-CM | POA: Diagnosis not present

## 2017-11-12 DIAGNOSIS — R7303 Prediabetes: Secondary | ICD-10-CM | POA: Diagnosis not present

## 2017-11-13 ENCOUNTER — Ambulatory Visit
Admission: RE | Admit: 2017-11-13 | Discharge: 2017-11-13 | Disposition: A | Discharge status: Home / Self Care | Payer: PPO | Source: Ambulatory Visit | Attending: Family Medicine | Admitting: Family Medicine

## 2017-11-13 ENCOUNTER — Other Ambulatory Visit: Payer: PPO

## 2017-11-13 DIAGNOSIS — R932 Abnormal findings on diagnostic imaging of liver and biliary tract: Secondary | ICD-10-CM

## 2017-11-13 DIAGNOSIS — K429 Umbilical hernia without obstruction or gangrene: Secondary | ICD-10-CM | POA: Diagnosis not present

## 2017-11-13 MED ORDER — IOPAMIDOL (ISOVUE-300) INJECTION 61%
125.0000 mL | Freq: Once | INTRAVENOUS | Status: AC | PRN
  Administered 2017-11-13: 125 mL via INTRAVENOUS

## 2017-11-20 DIAGNOSIS — F3132 Bipolar disorder, current episode depressed, moderate: Secondary | ICD-10-CM | POA: Diagnosis not present

## 2017-11-28 DIAGNOSIS — E039 Hypothyroidism, unspecified: Secondary | ICD-10-CM | POA: Diagnosis not present

## 2017-11-28 DIAGNOSIS — F319 Bipolar disorder, unspecified: Secondary | ICD-10-CM | POA: Diagnosis not present

## 2017-11-28 DIAGNOSIS — R2681 Unsteadiness on feet: Secondary | ICD-10-CM | POA: Diagnosis not present

## 2017-12-24 DIAGNOSIS — F3132 Bipolar disorder, current episode depressed, moderate: Secondary | ICD-10-CM | POA: Diagnosis not present

## 2017-12-30 DIAGNOSIS — F322 Major depressive disorder, single episode, severe without psychotic features: Secondary | ICD-10-CM | POA: Diagnosis not present

## 2018-01-16 ENCOUNTER — Emergency Department (HOSPITAL_COMMUNITY)
Admission: EM | Admit: 2018-01-16 | Discharge: 2018-01-16 | Disposition: A | Discharge status: Home / Self Care | Payer: PPO | Attending: Emergency Medicine | Admitting: Emergency Medicine

## 2018-01-16 ENCOUNTER — Emergency Department (HOSPITAL_COMMUNITY): Payer: PPO

## 2018-01-16 ENCOUNTER — Encounter (HOSPITAL_COMMUNITY): Payer: Self-pay | Admitting: Emergency Medicine

## 2018-01-16 DIAGNOSIS — R2689 Other abnormalities of gait and mobility: Secondary | ICD-10-CM | POA: Diagnosis not present

## 2018-01-16 DIAGNOSIS — F419 Anxiety disorder, unspecified: Secondary | ICD-10-CM | POA: Diagnosis not present

## 2018-01-16 DIAGNOSIS — J449 Chronic obstructive pulmonary disease, unspecified: Secondary | ICD-10-CM | POA: Diagnosis not present

## 2018-01-16 DIAGNOSIS — R296 Repeated falls: Secondary | ICD-10-CM | POA: Insufficient documentation

## 2018-01-16 DIAGNOSIS — M5136 Other intervertebral disc degeneration, lumbar region: Secondary | ICD-10-CM | POA: Diagnosis not present

## 2018-01-16 DIAGNOSIS — N183 Chronic kidney disease, stage 3 (moderate): Secondary | ICD-10-CM | POA: Insufficient documentation

## 2018-01-16 DIAGNOSIS — I129 Hypertensive chronic kidney disease with stage 1 through stage 4 chronic kidney disease, or unspecified chronic kidney disease: Secondary | ICD-10-CM | POA: Insufficient documentation

## 2018-01-16 DIAGNOSIS — F319 Bipolar disorder, unspecified: Secondary | ICD-10-CM | POA: Insufficient documentation

## 2018-01-16 DIAGNOSIS — E039 Hypothyroidism, unspecified: Secondary | ICD-10-CM | POA: Insufficient documentation

## 2018-01-16 DIAGNOSIS — Z87891 Personal history of nicotine dependence: Secondary | ICD-10-CM | POA: Diagnosis not present

## 2018-01-16 DIAGNOSIS — M545 Low back pain: Secondary | ICD-10-CM | POA: Diagnosis not present

## 2018-01-16 DIAGNOSIS — Z79899 Other long term (current) drug therapy: Secondary | ICD-10-CM | POA: Diagnosis not present

## 2018-01-16 DIAGNOSIS — M546 Pain in thoracic spine: Secondary | ICD-10-CM | POA: Diagnosis not present

## 2018-01-16 LAB — URINALYSIS, ROUTINE W REFLEX MICROSCOPIC
BILIRUBIN URINE: NEGATIVE
Glucose, UA: NEGATIVE mg/dL
HGB URINE DIPSTICK: NEGATIVE
Ketones, ur: NEGATIVE mg/dL
Leukocytes, UA: NEGATIVE
NITRITE: NEGATIVE
Protein, ur: NEGATIVE mg/dL
Specific Gravity, Urine: 1.013 (ref 1.005–1.030)
pH: 5 (ref 5.0–8.0)

## 2018-01-16 LAB — COMPREHENSIVE METABOLIC PANEL
ALT: 55 U/L (ref 17–63)
AST: 43 U/L — AB (ref 15–41)
Albumin: 4.7 g/dL (ref 3.5–5.0)
Alkaline Phosphatase: 90 U/L (ref 38–126)
Anion gap: 8 (ref 5–15)
BILIRUBIN TOTAL: 1 mg/dL (ref 0.3–1.2)
BUN: 19 mg/dL (ref 6–20)
CHLORIDE: 103 mmol/L (ref 101–111)
CO2: 26 mmol/L (ref 22–32)
Calcium: 9.6 mg/dL (ref 8.9–10.3)
Creatinine, Ser: 1.35 mg/dL — ABNORMAL HIGH (ref 0.61–1.24)
GFR, EST NON AFRICAN AMERICAN: 54 mL/min — AB (ref >60)
Glucose, Bld: 113 mg/dL — ABNORMAL HIGH (ref 65–99)
POTASSIUM: 4.5 mmol/L (ref 3.5–5.1)
Sodium: 137 mmol/L (ref 135–145)
TOTAL PROTEIN: 7.4 g/dL (ref 6.5–8.1)

## 2018-01-16 LAB — CBC
HEMATOCRIT: 47.5 % (ref 39.0–52.0)
Hemoglobin: 16.9 g/dL (ref 13.0–17.0)
MCH: 32.6 pg (ref 26.0–34.0)
MCHC: 35.6 g/dL (ref 30.0–36.0)
MCV: 91.5 fL (ref 78.0–100.0)
PLATELETS: 199 10*3/uL (ref 150–400)
RBC: 5.19 MIL/uL (ref 4.22–5.81)
RDW: 12.5 % (ref 11.5–15.5)
WBC: 5.7 10*3/uL (ref 4.0–10.5)

## 2018-01-16 MED ORDER — METHYLPREDNISOLONE 4 MG PO TBPK: ORAL_TABLET | ORAL | 0 refills | Status: DC

## 2018-01-16 NOTE — ED Notes (Signed)
Patient transported to X-ray 

## 2018-01-16 NOTE — Discharge Instructions (Signed)
If you develop any worsening lower extremity weakness, numbness, difficulty walking, difficulty going to the bathroom or accidental incontinence, return to the ER immediately.  Take the full course of steroids as prescribed.

## 2018-01-16 NOTE — ED Triage Notes (Signed)
Patient presents with wife stating this morning patient had a fall around 930 stating his "legs felt like jelly." appt with neuro on 18th due to this happening every 5 or so months. Denies hitting head or any LOC. Wife denies any changes in recent medications. No other neuro deficits. States from fall landed on both knees, but denies any pain. Denies any bowel or bladder issues.

## 2018-01-16 NOTE — ED Notes (Signed)
Bed: WA08 Expected date:  Expected time:  Means of arrival:  Comments: 

## 2018-01-16 NOTE — ED Provider Notes (Signed)
[facility] DEPT Provider Note   CSN: [id number] Arrival date & time: 01/16/18  1018     History   Chief Complaint Chief Complaint  Patient presents with  . Fall    HPI Reginald Tucker is a 62 y.o. male.  HPI  62 year old male with extensive past medical history as below here with recurrent falls.  The patient reportedly has been falling for at least the last 6 months.  Has had recurrent episodes in which his legs "give out" and "go like jelly."  He has been seen by his PCP and is currently being referred by a neurologist for this.  These episodes seem to be associated with when he trips and he has weakness in his legs.  He has no associated pain.  The symptoms are always bilateral without symptoms of unilateral weakness or numbness.  Patient was walking earlier today in the hall when his legs just "gave out."  He did not lose consciousness.  Strongly denies any associated chest pain or palpitations.  He has no focal weakness or numbness in his arms or legs.  This is similar to his previous episodes.  Given that this occurred without overtly tripping, he presents for evaluation.  Denies any new numbness or tingling in his legs.  No loss of bowel bladder function.  No seizure-like activity.  No back pain.  Past Medical History:  Diagnosis Date  . Acquired hallux rigidus of right foot 04/26/2017  . AKI (acute kidney injury) ([place]) 06/11/2017  . Anxiety   . Bipolar 1 disorder ([place])   . BPH (benign prostatic hyperplasia)   . Cataract   . Colon polyps   . COPD GOLD II with restrictive component  01/13/2016   Spirometry 01/13/2016  FEV1 1.84 (47%)  Ratio 62  - 01/13/2016  extensive coaching HFA effectiveness =    90% > try stiolto respimat 2 pffs each am > did not benefit so stopped when sample out - 01/13/2016  Walked RA x 3 laps @ 185 ft each stopped due to  End of study, nl pace, no desat  / min sob  - PFT's  03/16/2016  FEV1 2.28 (59 % ) ratio 67  p 12 % improvement from  saba p no prior to study with DLCO  66 % corrects to 86 % for alv volume     . Essential hypertension 01/19/2015  . Family history of coronary arteriosclerosis 01/19/2015   Father with MI   . GERD (gastroesophageal reflux disease) 08/11/2014  . History of colon polyps 06/18/2017  . Hypertension   . Hypothyroidism 08/11/2014  . Insomnia 06/18/2017  . Mixed hyperlipidemia 06/18/2017  . Morbid obesity ([place]) 12/19/5091   Complicated by HBP/ Low erv on pfts 03/16/2016 (31%)    . Shortness of breath 06/18/2017  . Thyroid disease   . Tinnitus of both ears 06/18/2017  . Tobacco use 06/18/2017  . Vitamin D deficiency 06/18/2017    Patient Active Problem List   Diagnosis Date Noted  . H/O toe surgery 06/20/2017  . Acute renal failure superimposed on stage 3 chronic kidney disease ([place]) 06/20/2017  . Hyperkalemia 06/20/2017  . BPH (benign prostatic hyperplasia) 06/18/2017  . History of colon polyps 06/18/2017  . Insomnia 06/18/2017  . Mixed hyperlipidemia 06/18/2017  . Shortness of breath 06/18/2017  . Tinnitus of both ears 06/18/2017  . Tobacco use 06/18/2017  . Vitamin D deficiency 06/18/2017  . AKI (acute kidney injury) ([place]) 06/11/2017  . Acquired hallux rigidus  of right foot 04/26/2017  . Morbid obesity ([place]) 03/18/2016  . COPD GOLD II with restrictive component  01/13/2016  . Family history of coronary arteriosclerosis 01/19/2015  . Essential hypertension 01/19/2015  . Bipolar 1 disorder ([place]) 08/11/2014  . GERD (gastroesophageal reflux disease) 08/11/2014  . Hypothyroidism 08/11/2014    Past Surgical History:  Procedure Laterality Date  . COLONOS    . COLONSCOPY    . CYST EXCISION  12/24/2016   sebaceous cyst chest wall  Dr. Hulen Skains  . MASS EXCISION Left 12/24/2016   Procedure: EXCISION LEFT CHEST WALL SEBACEOUS CYST;  Surgeon: Judeth Horn, MD;  Location: [facility];  Service: General;  Laterality: Left;       Home Medications    Prior to Admission medications     Medication Sig Start Date End Date Taking? Authorizing Provider  Acetylcysteine (N-ACETYL-L-CYSTEINE) 600 MG CAPS Take 600 mg by mouth daily.   Yes [provider]  Cholecalciferol (VITAMIN D3) 5000 units CAPS Take 5,000 Units by mouth daily.   Yes [provider]  L-Theanine 100 MG CAPS Take 100 mg by mouth daily.   Yes [provider]  lamoTRIgine (LAMICTAL) 100 MG tablet Take 100 mg by mouth 2 (two) times daily.   Yes [provider]  levothyroxine (SYNTHROID, LEVOTHROID) 150 MCG tablet Take 150 mcg by mouth daily before breakfast.   Yes [provider]  lovastatin (MEVACOR) 40 MG tablet Take 40 mg by mouth daily.    Yes [provider]  MAGNESIUM CITRATE PO Take 1 tablet by mouth daily.    Yes [provider]  Omega-3 Fatty Acids (SUPER OMEGA 3 PO) Take 4 capsules by mouth daily.   Yes [provider]  omeprazole (PRILOSEC) 20 MG capsule Take 20 mg by mouth daily.    Yes [provider]  OVER THE COUNTER MEDICATION Take 3 tablets by mouth daily. Stress RX OTC   Yes [provider]  QUEtiapine (SEROQUEL) 400 MG tablet Take 400 mg by mouth at bedtime.    Yes [provider]  risperiDONE (RISPERDAL) 1 MG tablet Take 1.5 mg by mouth at bedtime.    Yes [provider]  selegiline (ELDEPRYL) 5 MG tablet Take 20 mg by mouth 2 (two) times daily with a meal. Am and around noon   Yes [provider]  vitamin B-12 (CYANOCOBALAMIN) 1000 MCG tablet Take 1,000 mcg by mouth daily.   Yes [provider]  methylPREDNISolone (MEDROL DOSEPAK) 4 MG TBPK tablet Take as directed on package 01/16/18   Duffy Bruce, MD    Family History Family History  Problem Relation Age of Onset  . Cancer Mother   . Hyperlipidemia Father   . Hypertension Father   . CAD Father   . Stroke Father   . Hyperlipidemia Brother     Social History Social History   Tobacco Use  . Smoking status:  Former Smoker    Packs/day: 1.00    Years: 25.00    Pack years: 25.00    Types: Cigarettes    Last attempt to quit: 12/10/2000    Years since quitting: 17.1  . Smokeless tobacco: Former Systems developer  . Tobacco comment: heavy vape user  Substance Use Topics  . Alcohol use: No    Alcohol/week: 0.0 oz  . Drug use: No     Allergies   Ambien [zolpidem tartrate]; Sulfa antibiotics; and Sulfamethoxazole   Review of Systems Review of Systems  Constitutional: Negative for chills,  fatigue and fever.  HENT: Negative for congestion and rhinorrhea.   Eyes: Negative for visual disturbance.  Respiratory: Negative for cough, shortness of breath and wheezing.   Cardiovascular: Negative for chest pain and leg swelling.  Gastrointestinal: Negative for abdominal pain, diarrhea, nausea and vomiting.  Genitourinary: Negative for dysuria and flank pain.  Musculoskeletal: Positive for gait problem. Negative for neck pain and neck stiffness.  Skin: Negative for rash and wound.  Allergic/Immunologic: Negative for immunocompromised state.  Neurological: Negative for syncope, weakness and headaches.  All other systems reviewed and are negative.    Physical Exam Updated Vital Signs BP (!) 120/91 (BP Location: Left Arm)   Pulse 85   Temp (!) 97.4 F (36.3 C) (Oral)   Resp 18   SpO2 96%   Physical Exam  Constitutional: He is oriented to person, place, and time. He appears well-developed and well-nourished. No distress.  HENT:  Head: Normocephalic and atraumatic.  Eyes: Conjunctivae are normal.  Neck: Neck supple.  Cardiovascular: Normal rate, regular rhythm and normal heart sounds. Exam reveals no friction rub.  No murmur heard. Pulmonary/Chest: Effort normal and breath sounds normal. No respiratory distress. He has no wheezes. He has no rales.  Abdominal: He exhibits no distension.  Musculoskeletal: He exhibits no edema.  Neurological: He is alert and oriented to person, place, and time. He exhibits  normal muscle tone.  Skin: Skin is warm. Capillary refill takes less than 2 seconds.  Psychiatric: He has a normal mood and affect.  Nursing note and vitals reviewed.   Neurological Exam:  Mental Status: Alert and oriented to person, place, and time. Attention and concentration normal. Speech clear. Recent memory is intact. Cranial Nerves: Visual fields grossly intact. EOMI and PERRLA. No nystagmus noted. Facial sensation intact at forehead, maxillary cheek, and chin/mandible bilaterally. No facial asymmetry or weakness. Hearing grossly normal. Uvula is midline, and palate elevates symmetrically. Normal SCM and trapezius strength. Tongue midline without fasciculations. Motor: Muscle strength 5/5 in proximal and distal UE and LE bilaterally. In particular, strength 5/5 with hip flexion/extension, abduction/adduction, knee flexion/extension, foot eversion/inversion/dorsiflexion/plantarflexion, and great toe inversion. No pronator drift. Muscle tone normal. Reflexes: 2+ and symmetrical in all four extremities.  Sensation: Intact to light touch in upper and lower extremities distally bilaterally. Intact perianal sensation and light touch distal LE bilaterally. Gait: Normal without ataxia. Coordination: Normal FTN bilaterally.   ED Treatments / Results  Labs (all labs ordered are listed, but only abnormal results are displayed) Labs Reviewed  COMPREHENSIVE METABOLIC PANEL - Abnormal; Notable for the following components:      Result Value   Glucose, Bld 113 (*)    Creatinine, Ser 1.35 (*)    AST 43 (*)    GFR calc non Af Amer 54 (*)    All other components within normal limits  CBC  URINALYSIS, ROUTINE W REFLEX MICROSCOPIC    EKG  EKG Interpretation None       Radiology Dg Thoracic Spine 2 View  Result Date: 01/16/2018 CLINICAL DATA:  Leg weakness and back pain EXAM: THORACIC SPINE 2 VIEWS COMPARISON:  None. FINDINGS: There is no evidence of thoracic spine fracture. Alignment is  normal. No other significant bone abnormalities are identified. IMPRESSION: Negative. Electronically Signed   By: Ulyses Jarred M.D.   On: 01/16/2018 22:07   Dg Lumbar Spine Complete  Result Date: 01/16/2018 CLINICAL DATA:  Leg weakness with back pain EXAM: LUMBAR SPINE - COMPLETE 4+ VIEW COMPARISON:  CT 11/13/2017 FINDINGS: Lumbar alignment  within normal limits. Vertebral body heights are normal. Mild to moderate disc space narrowing at L2-L3, L3-L4 and L5-S1 with anterior osteophytes. Posterior facet disease of the lower lumbar spine. IMPRESSION: 1. Mild-to-moderate degenerative changes 2. No acute osseous abnormality. Electronically Signed   By: Donavan Foil M.D.   On: 01/16/2018 21:55    Procedures Procedures (including critical care time)  Medications Ordered in ED Medications - No data to display   Initial Impression / Assessment and Plan / ED Course  I have reviewed the triage vital signs and the nursing notes.  Pertinent labs & imaging results that were available during my care of the patient were reviewed by me and considered in my medical decision making (see chart for details).     62 year old male here with recurrent episodes in which his legs give out followed by falls.  No head trauma with today's episode.  No apparent injury to his bilateral lower extremities. Based on his history, it seems some of this is likely 2/2 core imbalance/weakness which suggest possible lumbar etiology given that he has had chronic back pain, and symptoms are b/l in LE only. XRays obtained and do show moderate degenerative changes of the lumbar spine. He has no LE weakness, numbness, PVR is <100 cc (initially recorded as >100 but this was just a bladder scan; after voiding he is <50 cc), no perianal numbness or loss of rectal tone - no clinical evidence of cauda equina currently. He has no apparent infectious or underlying malignant etiologies. Lab work is overall reassuring - mild Cr elevation, unclear  if this is dehydration or possible CKD.   I discussed my suspicion that some of his episodes are 2/2 his lower lumbar disease. I do feel he'd benefit from an MRI but no emergent indication identified at this time. He has otherwise been worked up for these spells and has NO syncope, no lightheadedness/CP/SOB, or other sx to suggest cardiac or pulmonary etiology. He has no abdominal pain, no pulstaile masses, no signs of AAA. Will d/c with course of steroids for possible radicular component, hold on NSAIDs 2/2 Cr elevation, and refer back to his PCP. Return precautions discussed in detail.  Final Clinical Impressions(s) / ED Diagnoses   Final diagnoses:  Recurrent falls  Degenerative lumbar disc    ED Discharge Orders        Ordered    methylPREDNISolone (MEDROL DOSEPAK) 4 MG TBPK tablet  Status:  Discontinued     01/16/18 2302    methylPREDNISolone (MEDROL DOSEPAK) 4 MG TBPK tablet     01/16/18 2307       Duffy Bruce, MD 01/17/18 [phone]

## 2018-01-21 ENCOUNTER — Other Ambulatory Visit (HOSPITAL_COMMUNITY): Payer: Self-pay | Admitting: Family Medicine

## 2018-01-21 DIAGNOSIS — R29898 Other symptoms and signs involving the musculoskeletal system: Secondary | ICD-10-CM | POA: Diagnosis not present

## 2018-01-22 DIAGNOSIS — F3132 Bipolar disorder, current episode depressed, moderate: Secondary | ICD-10-CM | POA: Diagnosis not present

## 2018-01-27 ENCOUNTER — Ambulatory Visit: Payer: PPO | Admitting: Neurology

## 2018-01-27 ENCOUNTER — Encounter: Payer: Self-pay | Admitting: Neurology

## 2018-01-27 VITALS — BP 103/74 | HR 115 | Ht 75.0 in | Wt 235.0 lb

## 2018-01-27 DIAGNOSIS — R270 Ataxia, unspecified: Secondary | ICD-10-CM

## 2018-01-27 DIAGNOSIS — W19XXXA Unspecified fall, initial encounter: Secondary | ICD-10-CM

## 2018-01-27 DIAGNOSIS — G3281 Cerebellar ataxia in diseases classified elsewhere: Secondary | ICD-10-CM

## 2018-01-27 DIAGNOSIS — R269 Unspecified abnormalities of gait and mobility: Secondary | ICD-10-CM | POA: Diagnosis not present

## 2018-01-27 DIAGNOSIS — R29898 Other symptoms and signs involving the musculoskeletal system: Secondary | ICD-10-CM | POA: Diagnosis not present

## 2018-01-27 NOTE — Progress Notes (Signed)
[facility]    Provider:  Dr Jaynee Eagles Referring Provider: [name], MD Primary Care Physician:  [name], MD  CC:  imbalance  HPI:  Reginald Tucker is a 62 y.o. male here as a referral from Dr. Kenton Kingfisher for imbalance. PMHx bipolar disorder on multiple medications, hypertension, hypothyroidism, insomnia, COPD, tinnitus, hyperlipidemia, memory changes, morbid obesity, prediabetes, hyperlipidemia, stage III chronic kidney disease, unsteadiness on feet. He started stumbling a few years ago with random falls. Progressively worsened. Patient here with wife who provides information. His legs feel like Jelly and he has fallen.He reports tremors. His legs feel like jelly and then collapse. He was going down the steps and his legs felt like jelly. He has had multiple falls. Weakness of legs, lasts briefly but can have weakness that continues. Sense of smell is fine. Difficulty picking legs up but that has been ongoing since the 90s for decades, not new. No changes in voice volume, expressions of the face, no REM sleep disorder.  Tremors are sporadic, not today, but happens a lot on the morning, lasts an hour or two in the morning before eating. No neck pain, no weakness or symptoms in the arms, all lower extremity.No walking or exercises. Denies sensory changes in the feet or neuropathy signs/symptoms.  Reviewed notes, labs and imaging from outside physicians, which showed:   Reviewed referring physician notes.  Legs giving out.  He was at [facility] and he felt like his legs were like jelly and when he got up from a CT fell backwards striking the back of his head on his left occipital region on the table leg.  He did not lose consciousness.  He has not had any subsequent nausea or vomiting.  His wife reports that he seems to not lift up his feet when he walks which may be contributing to his recent fall stumble.  He apparently had 3 falls after the snowstorm in December 2018.  He is  on a number of mood stabilizing medicines prescribed by his psychiatrist, Dr. Charlott Holler.  They discussed his symptoms with psychiatry last week but psychiatry did not feel that his mood stabilizing medications were likely the cause of his current symptoms from a side effect standpoint.  He has not had any recent dose adjustments or new medications from a mood stabilizing standpoint.  He has episodes where he has dragged his feet on previously over the years and they have apparently commented on this on fall risk assessment.  Patient was referred for physical therapy to help with decreasing risk for falling in June 2018 S3 falls were documented on fall risk assessment at that time he did not attend.  His most recent lab work including [place] and TSH were unremarkable earlier this month December 2018. TSh was normal.   FINDINGS: Lumbar alignment within normal limits. Vertebral body heights are normal. Mild to moderate disc space narrowing at L2-L3, L3-L4 and L5-S1 with anterior osteophytes. Posterior facet disease of the lower lumbar spine.  IMPRESSION: 1. Mild-to-moderate degenerative changes 2. No acute osseous abnormality.   CMP 24, creatinine 1.34 11/28/2017   Review of Systems: Patient complains of symptoms per HPI as well as the following symptoms: Weight loss, fatigue, memory loss, weakness, sleepiness, snoring, hearing loss, ringing in ears, depression, too much sleep, not enough sleep, decreased energy, disinterest in activities. Pertinent negatives and positives per HPI. All others negative.   Social History   Socioeconomic History  . Marital status: Married    Spouse name: Not  on file  . Number of children: Not on file  . Years of education: Not on file  . Highest education level: Associate degree: occupational, Hotel manager, or vocational program  Social Needs  . Financial resource strain: Not on file  . Food insecurity - worry: Not on file  . Food insecurity - inability: Not on  file  . Transportation needs - medical: Not on file  . Transportation needs - non-medical: Not on file  Occupational History  . 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 Smoking status: Former Smoker    Packs/day: 1.00    Years: 25.00    Pack years: 25.00    Types: Cigarettes    Last attempt to quit: 12/10/2000    Years since quitting: 17.1  . Smokeless tobacco: Never Used  . Tobacco comment: heavy vape user- quit vaping 06/04/2017  Substance and Sexual Activity  . Alcohol use: No    Alcohol/week: 0.0 oz  . Drug use: No  . Sexual activity: Not on file  Other Topics Concern  . Not on file  Social History Narrative   Admitted to [facility] 06/14/17- discharged   Lives at home with his wife   Married - [name]   Former smoker - stopped 2002   Alcohol none   Full code   Right handed   Drinks 2 cups of caffeine daily    Family History  Problem Relation Age of Onset  . Cancer Mother   . Hyperlipidemia Father   . Hypertension Father   . CAD Father   . Stroke Father   . Aortic aneurysm Father   . Prostate cancer Father   . Hyperlipidemia Brother   . Appendicitis Maternal Grandfather     Past Medical History:  Diagnosis Date  . Acquired hallux rigidus of right foot 04/26/2017  . AKI (acute kidney injury) ([place]) 06/11/2017  . Anxiety   . Bipolar 1 disorder ([place])   . BMI 37.0-37.9, adult   . BPH (benign prostatic hyperplasia)   . Cataract   . Cataract    L eye  . CKD (chronic kidney disease), stage III ([place])   . Colon polyps   . COPD GOLD II with restrictive component  01/13/2016   Spirometry 01/13/2016  FEV1 1.84 (47%)  Ratio 62  - 01/13/2016  extensive coaching HFA effectiveness =    90% > try stiolto respimat 2 pffs each am > did not benefit so stopped when sample out - 01/13/2016  Walked RA x 3 laps @ 185 ft each stopped due to  End of study, nl pace, no desat  / min sob  - PFT's  03/16/2016  FEV1 2.28 (59 % ) ratio 67  p 12 % improvement from saba p no prior to study with DLCO  66 %  corrects to 86 % for alv volume     . Essential hypertension 01/19/2015  . Family history of coronary arteriosclerosis 01/19/2015   Father with MI   . GERD (gastroesophageal reflux disease) 08/11/2014  . History of colon polyps 06/18/2017  . Hyperlipidemia   . Hypertension   . Hypothyroidism 08/11/2014  . Hypothyroidism   . Insomnia 06/18/2017  . Insomnia   . Memory change   . Mixed hyperlipidemia 06/18/2017  . Morbid obesity ([place]) 06/10/5365   Complicated by HBP/ Low erv on pfts 03/16/2016 (31%)    . Morbid obesity due to excess calories ([place])   . Prediabetes   . Shortness of breath 06/18/2017  .  SOB (shortness of breath)   . Thyroid disease   . Tinnitus of both ears 06/18/2017  . Tobacco use 06/18/2017  . Unsteadiness on feet   . Vitamin D deficiency 06/18/2017  . Vitamin D deficiency     Past Surgical History:  Procedure Laterality Date  . COLONOS    . COLONSCOPY    . CYST EXCISION  12/24/2016   sebaceous cyst chest wall  Dr. Hulen Skains  . MASS EXCISION Left 12/24/2016   Procedure: EXCISION LEFT CHEST WALL SEBACEOUS CYST;  Surgeon: Judeth Horn, MD;  Location: [place];  Service: General;  Laterality: Left;  . TOE FUSION Right 2018    Current Outpatient Medications  Medication Sig Dispense Refill  . Acetylcysteine (N-ACETYL-L-CYSTEINE) 600 MG CAPS Take 600 mg by mouth daily.    . Cholecalciferol (VITAMIN D3) 5000 units CAPS Take 15,000 Units by mouth daily.     Marland Kitchen L-Theanine 100 MG CAPS Take 100 mg by mouth daily.    Marland Kitchen lamoTRIgine (LAMICTAL) 100 MG tablet Take by mouth 2 (two) times daily. 1 pill in AM 2 pills in PM    . levothyroxine (SYNTHROID, LEVOTHROID) 150 MCG tablet Take 150 mcg by mouth daily before breakfast.    . lovastatin (MEVACOR) 40 MG tablet Take 40 mg by mouth daily.     Marland Kitchen MAGNESIUM CITRATE PO Take 1 tablet by mouth daily.     . Omega-3 Fatty Acids (SUPER OMEGA 3 PO) Take 4 capsules by mouth daily.    Marland Kitchen omeprazole (PRILOSEC) 20 MG capsule Take 20 mg by  mouth daily.     Marland Kitchen OVER THE COUNTER MEDICATION Place 4 Squirts under the tongue daily. Hemp oil    . QUEtiapine (SEROQUEL) 400 MG tablet Take 400 mg by mouth at bedtime.     . risperiDONE (RISPERDAL) 1 MG tablet Take 1.5 mg by mouth at bedtime.     . selegiline (ELDEPRYL) 5 MG tablet Take 20 mg by mouth 2 (two) times daily with a meal. AM dose and second dose 4 hours later    . vitamin B-12 (CYANOCOBALAMIN) 1000 MCG tablet Take 1,000 mcg by mouth daily.     No current facility-administered medications for this visit.     Allergies as of 01/27/2018 - Review Complete 01/27/2018  Allergen Reaction Noted  . Ambien [zolpidem tartrate] Other (See Comments) 01/19/2015  . Sulfa antibiotics Other (See Comments) 06/10/2017  . Sulfamethoxazole Other (See Comments) 01/19/2015    Vitals: BP 103/74 (BP Location: Right Arm, Patient Position: Sitting)   Pulse (!) 115   Ht 6\' 3"  (1.905 m)   Wt 235 lb (106.6 kg)   BMI 29.37 kg/m  Last Weight:  Wt Readings from Last 1 Encounters:  01/27/18 235 lb (106.6 kg)   Last Height:   Ht Readings from Last 1 Encounters:  01/27/18 6\' 3"  (1.905 m)    Physical exam: Exam: Gen: NAD, conversant, well nourised, morbidly obese, well groomed                     CV: RRR, no MRG. No Carotid Bruits. No peripheral edema, warm, nontender Eyes: Conjunctivae clear without exudates or hemorrhage  Neuro: Detailed Neurologic Exam  Speech:    Speech is normal; fluent and spontaneous with normal comprehension.  Cognition:    The patient is oriented to person, place, and time;     recent and remote memory intact;     language fluent;     normal attention,  concentration,     fund of knowledge Cranial Nerves:    The pupils are equal, round, and reactive to light. Attempted fundoscopic exam could not visualize.  Visual fields are full to finger confrontation. Extraocular movements are intact. Trigeminal sensation is intact and the muscles of mastication are normal. The  face is symmetric. The palate elevates in the midline. Hearing intact. Voice is normal. Shoulder shrug is normal. The tongue has normal motion without fasciculations.   Coordination:    Normal finger to nose  Gait:    Heel-toe and tandem gait are normal. Good stride and arm swing.   Motor Observation:    No asymmetry, no atrophy, very minimal postural tremor, no resting tremor, no asterixis Tone:    Normal muscle tone.    Posture:    Very minimally stooped    Strength:    Strength is V/V in the upper and lower limbs.      Sensation: intact to LT, Romberg negative     Reflex Exam:  DTR's:    Deep tendon reflexes in the upper and lower extremities are normal bilaterally.   Toes:    The toes are downgoing bilaterally (right toe is post-surgically) Clonus:    Clonus is absent.      Assessment/Plan:   62 y.o. male here as a referral from Dr. Kenton Kingfisher for imbalance. PMHx bipolar disorder on multiple medications, hypertension, hypothyroidism, insomnia, COPD, tinnitus, hyperlipidemia, memory changes, morbid obesity, prediabetes, hyperlipidemia, stage III chronic kidney disease, unsteadiness on feet. Multiple falls, imbalance, gait abnormality, ataxia.   - Neurologic exam is non focal and reflexes are all normal. May be due to sedentary lifestyle and large body habitus. But needs complete neurologic evaluation to rule out other causes.  - He is not walking, no exercising, has not been to PT. Referral to PT for gait ataxia and falls.   - No parkinsonian features on exam  - Pending MRI Lumbar spine this Friday. Will add MRI brain to eval for strokes or other etiology. If negative may consider  MRI cervical spine  - MRI brain to evaluate for strokes or other etiology of weakness in LE and ataxia, falls  - Discussed weight loss and exercise  - EMG/NCS of the bilateral lowers  - Discussed fall precautions.  Orders Placed This Encounter  Procedures  . MR BRAIN WO CONTRAST  .  Ambulatory referral to Physical Therapy  . NCV with EMG(electromyography)   Cc: Dr. Maylon Peppers, MD  [facility] [address] [place], [place] [address]  Phone [phone] Fax [phone]

## 2018-01-27 NOTE — Patient Instructions (Signed)
MRI brain and MRI lumbar spine (we willc all and see if we can add brain) EMG/NCS Physical Therapy

## 2018-01-28 ENCOUNTER — Telehealth: Payer: Self-pay | Admitting: Neurology

## 2018-01-28 DIAGNOSIS — W19XXXA Unspecified fall, initial encounter: Secondary | ICD-10-CM

## 2018-01-28 DIAGNOSIS — G3281 Cerebellar ataxia in diseases classified elsewhere: Secondary | ICD-10-CM

## 2018-01-28 DIAGNOSIS — R29898 Other symptoms and signs involving the musculoskeletal system: Secondary | ICD-10-CM

## 2018-01-28 DIAGNOSIS — R270 Ataxia, unspecified: Secondary | ICD-10-CM

## 2018-01-28 NOTE — Telephone Encounter (Signed)
Patient has an order for an MRI. She spoke with the scheduler at [facility] and says they need an order for the patient to have general anesthesia since he is claustrophobic. [facility] says they put a helmet over the patient's face for the brain MRI and says pills do not last long enough.Gillermina Hu suggests patient have general anesthesia.

## 2018-01-29 NOTE — Telephone Encounter (Signed)
Spoke with pt's wife Reginald Tucker (on [place]). Informed her that Dr. Jaynee Eagles has authorized that the MRI be sedated. The pt's wife verbalized appreciation and requested that the location be [facility] for the anesthesia. She also would like for the patient to receive his MRI lumbar spine at the same time. This was ordered this month by Dr. Kenton Kingfisher. She also asked that since pt is claustrophobic and is requiring general anesthesia, could the patient qualify for any insurance coverage? RN told Reginald Tucker that she would inquire about this as she does not deal specifically with the authorizations and scheduling. She verbalized appreciation.

## 2018-01-29 NOTE — Telephone Encounter (Signed)
Bethany, please call back. It is fine to order with sedation thanks

## 2018-01-29 NOTE — Telephone Encounter (Signed)
Spoke to patient and schedule the sedated MRI for March 5 at [facility]. I informed her that I can not schedule Dr. Kenton Kingfisher MRI since I don't work for his office.. She states she will contact them to get schedule for it to be all on the same day.

## 2018-01-29 NOTE — Telephone Encounter (Signed)
Pt's wife called she is upset she rec'd a call from GI instead of the RN. She said he cannot have any scan at GI it needs to be at the hospital and have general anesthesia. She is requesting a call from RN.

## 2018-01-29 NOTE — Telephone Encounter (Signed)
Okay no problem. When you get a chance can you change the order to have it sedated on the order.

## 2018-01-29 NOTE — Addendum Note (Signed)
Addended by: Gildardo Griffes on: 01/29/2018 01:34 PM   Modules accepted: Orders

## 2018-01-29 NOTE — Telephone Encounter (Signed)
Reginald Tucker with [facility] is requesting a call back at [phone] to discuss the MRI stating no authorization will be needed

## 2018-01-30 ENCOUNTER — Ambulatory Visit: Payer: PPO | Admitting: Neurology

## 2018-01-30 ENCOUNTER — Ambulatory Visit (INDEPENDENT_AMBULATORY_CARE_PROVIDER_SITE_OTHER): Payer: PPO | Admitting: Neurology

## 2018-01-30 DIAGNOSIS — R0683 Snoring: Secondary | ICD-10-CM

## 2018-01-30 DIAGNOSIS — G4719 Other hypersomnia: Secondary | ICD-10-CM

## 2018-01-30 DIAGNOSIS — R29898 Other symptoms and signs involving the musculoskeletal system: Secondary | ICD-10-CM | POA: Diagnosis not present

## 2018-01-30 DIAGNOSIS — W19XXXA Unspecified fall, initial encounter: Secondary | ICD-10-CM

## 2018-01-30 DIAGNOSIS — M4802 Spinal stenosis, cervical region: Secondary | ICD-10-CM

## 2018-01-30 DIAGNOSIS — Z0289 Encounter for other administrative examinations: Secondary | ICD-10-CM

## 2018-01-30 DIAGNOSIS — G3281 Cerebellar ataxia in diseases classified elsewhere: Secondary | ICD-10-CM

## 2018-01-30 DIAGNOSIS — R270 Ataxia, unspecified: Secondary | ICD-10-CM

## 2018-01-30 NOTE — Procedures (Signed)
Full Name: Reginald Tucker Gender: Male MRN #: [id number] Date of Birth: 1956-02-08    Visit Date: 01/30/2018 09:13 Age: 62 Years 23 Months Old Examining Physician: [name], MD  Referring Physician:  [name], MD      History: 62 y.o. male here as a referral from Dr. Kenton Kingfisher for imbalance, unsteadiness on feet, falls,  gait abnormality, ataxia.   Summary: EMG/NCS was performed on the bilateral lower extremities. All nerves and muscles were within normal limits.  Conclusion: Normal study.  Cc:  [name], MD  [name] M.D.  [facility] [place], [place] [address] Tel: [phone] Fax: [phone]        [facility]    Nerve / Sites Muscle Latency Ref. Amplitude Ref. Rel Amp Segments Distance Velocity Ref. Area    ms ms mV mV %  cm m/s m/s mVms  R Peroneal - EDB     Ankle EDB 6.5 ?6.5 6.7 ?2.0 100 Ankle - EDB 9   20.8     Fib head EDB 14.0  7.1  106 Fib head - Ankle 34 45 ?44 32.6     Pop fossa EDB 16.7  6.7  94.2 Pop fossa - Fib head 12 44 ?44 23.8         Pop fossa - Ankle      L Peroneal - EDB     Ankle EDB 6.1 ?6.5 7.3 ?2.0 100 Ankle - EDB 9   21.7     Fib head EDB 13.9  6.4  88.2 Fib head - Ankle 34 44 ?44 20.5     Pop fossa EDB 16.6  6.4  99.3 Pop fossa - Fib head 12 44 ?44 19.3         Pop fossa - Ankle      R Tibial - AH     Ankle AH 5.2 ?5.8 8.9 ?4.0 100 Ankle - AH 9   20.2     Pop fossa AH 15.0  6.3  71.4 Pop fossa - Ankle 40 41 ?41 19.0  L Tibial - AH     Ankle AH 5.1 ?5.8 9.3 ?4.0 100 Ankle - AH 9   24.5     Pop fossa AH 14.9  6.2  66.5 Pop fossa - Ankle 40 41 ?41 21.9             SNC    Nerve / Sites Rec. Site Peak Lat Ref.  Amp Ref. Segments Distance    ms ms V V  cm  R Sural - Ankle (Calf)     Calf Ankle 3.7 ?4.4 11 ?6 Calf - Ankle 14  L Sural - Ankle (Calf)     Calf Ankle 3.9 ?4.4 16 ?6 Calf - Ankle 14  L Superficial peroneal - Ankle     Lat leg Ankle 4.4 ?4.4 6 ?6 Lat leg - Ankle 14  R  Superficial peroneal - Ankle     Lat leg Ankle 4.3 ?4.4 7 ?6 Lat leg - Ankle 14              F  Wave    Nerve F Lat Ref.   ms ms  R Tibial - AH 55.9 ?56.0  L Tibial - AH 55.5 ?56.0         EMG full       EMG Summary Table    Spontaneous MUAP Recruitment  Muscle IA Fib PSW Fasc Other Amp Dur.  Poly Pattern  L. Iliopsoas Normal None None None _______ Normal Normal Normal Normal  R. Iliopsoas Normal None None None _______ Normal Normal Normal Normal  L. Vastus medialis Normal None None None _______ Normal Normal Normal Normal  R. Vastus medialis Normal None None None _______ Normal Normal Normal Normal  L. Tibialis anterior Normal None None None _______ Normal Normal Normal Normal  R. Tibialis anterior Normal None None None _______ Normal Normal Normal Normal  L. Gastrocnemius (Medial head) Normal None None None _______ Normal Normal Normal Normal  R. Gastrocnemius (Medial head) Normal None None None _______ Normal Normal Normal Normal  L. Biceps femoris (long head) Normal None None None _______ Normal Normal Normal Normal  R. Biceps femoris (long head) Normal None None None _______ Normal Normal Normal Normal  L. Gluteus maximus Normal None None None _______ Normal Normal Normal Normal  R. Gluteus maximus Normal None None None _______ Normal Normal Normal Normal  L. Gluteus medius Normal None None None _______ Normal Normal Normal Normal  R. Gluteus medius Normal None None None _______ Normal Normal Normal Normal  L. Lumbar paraspinals (low) Normal None None None _______ Normal Normal Normal Normal  R. Lumbar paraspinals (low) Normal None None None _______ Normal Normal Normal Normal

## 2018-01-30 NOTE — Progress Notes (Signed)
See procedure note.

## 2018-01-30 NOTE — Progress Notes (Signed)
Full Name: Reginald Tucker Gender: Male MRN #: [id number] Date of Birth: 21-Sep-1956    Visit Date: 01/30/2018 09:13 Age: 62 Years 65 Months Old Examining Physician: [name], MD  Referring Physician:  [name], MD      History: 62 y.o. male here as a referral from Dr. Kenton Kingfisher for imbalance, unsteadiness on feet, falls,  gait abnormality, ataxia.   Summary: EMG/NCS was performed on the bilateral lower extremities. All nerves and muscles were within normal limits.  Conclusion: Normal study.  Cc:  [name], MD  [name] M.D.  [facility] [place], [place] [address] Tel: [phone] Fax: [phone]        [facility]    Nerve / Sites Muscle Latency Ref. Amplitude Ref. Rel Amp Segments Distance Velocity Ref. Area    ms ms mV mV %  cm m/s m/s mVms  R Peroneal - EDB     Ankle EDB 6.5 ?6.5 6.7 ?2.0 100 Ankle - EDB 9   20.8     Fib head EDB 14.0  7.1  106 Fib head - Ankle 34 45 ?44 32.6     Pop fossa EDB 16.7  6.7  94.2 Pop fossa - Fib head 12 44 ?44 23.8         Pop fossa - Ankle      L Peroneal - EDB     Ankle EDB 6.1 ?6.5 7.3 ?2.0 100 Ankle - EDB 9   21.7     Fib head EDB 13.9  6.4  88.2 Fib head - Ankle 34 44 ?44 20.5     Pop fossa EDB 16.6  6.4  99.3 Pop fossa - Fib head 12 44 ?44 19.3         Pop fossa - Ankle      R Tibial - AH     Ankle AH 5.2 ?5.8 8.9 ?4.0 100 Ankle - AH 9   20.2     Pop fossa AH 15.0  6.3  71.4 Pop fossa - Ankle 40 41 ?41 19.0  L Tibial - AH     Ankle AH 5.1 ?5.8 9.3 ?4.0 100 Ankle - AH 9   24.5     Pop fossa AH 14.9  6.2  66.5 Pop fossa - Ankle 40 41 ?41 21.9             SNC    Nerve / Sites Rec. Site Peak Lat Ref.  Amp Ref. Segments Distance    ms ms V V  cm  R Sural - Ankle (Calf)     Calf Ankle 3.7 ?4.4 11 ?6 Calf - Ankle 14  L Sural - Ankle (Calf)     Calf Ankle 3.9 ?4.4 16 ?6 Calf - Ankle 14  L Superficial peroneal - Ankle     Lat leg Ankle 4.4 ?4.4 6 ?6 Lat leg - Ankle 14  R  Superficial peroneal - Ankle     Lat leg Ankle 4.3 ?4.4 7 ?6 Lat leg - Ankle 14              F  Wave    Nerve F Lat Ref.   ms ms  R Tibial - AH 55.9 ?56.0  L Tibial - AH 55.5 ?56.0         EMG full       EMG Summary Table    Spontaneous MUAP Recruitment  Muscle IA Fib PSW Fasc Other Amp Dur.  Poly Pattern  L. Iliopsoas Normal None None None _______ Normal Normal Normal Normal  R. Iliopsoas Normal None None None _______ Normal Normal Normal Normal  L. Vastus medialis Normal None None None _______ Normal Normal Normal Normal  R. Vastus medialis Normal None None None _______ Normal Normal Normal Normal  L. Tibialis anterior Normal None None None _______ Normal Normal Normal Normal  R. Tibialis anterior Normal None None None _______ Normal Normal Normal Normal  L. Gastrocnemius (Medial head) Normal None None None _______ Normal Normal Normal Normal  R. Gastrocnemius (Medial head) Normal None None None _______ Normal Normal Normal Normal  L. Biceps femoris (long head) Normal None None None _______ Normal Normal Normal Normal  R. Biceps femoris (long head) Normal None None None _______ Normal Normal Normal Normal  L. Gluteus maximus Normal None None None _______ Normal Normal Normal Normal  R. Gluteus maximus Normal None None None _______ Normal Normal Normal Normal  L. Gluteus medius Normal None None None _______ Normal Normal Normal Normal  R. Gluteus medius Normal None None None _______ Normal Normal Normal Normal  L. Lumbar paraspinals (low) Normal None None None _______ Normal Normal Normal Normal  R. Lumbar paraspinals (low) Normal None None None _______ Normal Normal Normal Normal

## 2018-01-31 ENCOUNTER — Ambulatory Visit (HOSPITAL_COMMUNITY): Payer: PPO

## 2018-01-31 NOTE — Telephone Encounter (Signed)
Patients wife called and stated that she spoke with someone regarding the "CPT code for sedation". The patients wife wants to know the cost of having the MRI sedated and she wants to know why no one can tell her the CPT code for sedation. She said that this woman told her our office would need to send something over that said it was medically necessary for the patient to have sedation. I asked her who we needed to send this information to and she was not sure. She states that this is very confusing and she does not understand. I told her I would call and try and figure out what is going on. Their phone number is [phone] extension [phone].   I called over to [facility] and spoke with Myra, she stated that the patient will need a letter of medical necessity sent over to his insurance in order for him to have the MRI sedated. They have requested that we send the letter to their office and they will file it with the insurance claim. Please fax this letter over to [facility] at [phone]. Myra also explained that she cannot give a specific CPT code for sedation because the code varies depending on how many units they give the patient while putting them under sedation. Once the know they unit amount they will have a CPT code, this will not be done until the MRI. Therefor there is no way to give an exact cost estimate.   I called and explained this to the patient and she was very appreciative.

## 2018-02-03 ENCOUNTER — Ambulatory Visit (HOSPITAL_COMMUNITY): Payer: PPO

## 2018-02-03 NOTE — Addendum Note (Signed)
Addended by: [name] B on: 02/03/2018 09:35 AM   Modules accepted: Orders

## 2018-02-03 NOTE — Telephone Encounter (Signed)
Noted, thank you for your help!  °

## 2018-02-03 NOTE — Telephone Encounter (Signed)
Completed Radiology sedation form and faxed signed form to [facility] Radiology dept along with office note. Received a receipt of confirmation.

## 2018-02-05 NOTE — Telephone Encounter (Signed)
Spoke with Myra @ [facility]. Discussed letter of medical necessity. She stated that the letter was a suggestion as it may speed up the process but still no guarantee of payment. Will d/w Dr. Jaynee Eagles.

## 2018-02-07 ENCOUNTER — Encounter: Payer: Self-pay | Admitting: *Deleted

## 2018-02-07 ENCOUNTER — Other Ambulatory Visit: Payer: Self-pay

## 2018-02-07 ENCOUNTER — Encounter (HOSPITAL_COMMUNITY): Payer: Self-pay | Admitting: *Deleted

## 2018-02-07 NOTE — Progress Notes (Signed)
Spoke with pt's wife for pre-op call (DPR on file). She states pt does not have any cardiac history. Pt has hx of COPD but pt states he no longer has it due to weight loss and smoking cessation. She states pt is not diabetic.

## 2018-02-07 NOTE — Telephone Encounter (Signed)
Printed, ready for signature.

## 2018-02-07 NOTE — Telephone Encounter (Signed)
Please write a letter stating it is critical we have MRIs completed due to neurologic symptoms. Patient is claustrophobic and has been unable to tolerate MRI even with anti-anxiety medications such as Benzodiazepines. We recommend sedation for MRIs.

## 2018-02-10 NOTE — Anesthesia Preprocedure Evaluation (Addendum)
Anesthesia Evaluation  Patient identified by MRN, date of birth, ID band Patient awake    Reviewed: Allergy & Precautions, H&P , NPO status , Patient's Chart, lab work & pertinent test results  Airway Mallampati: III  TM Distance: >3 FB Neck ROM: Full    Dental no notable dental hx. (+) Teeth Intact, Dental Advisory Given   Pulmonary former smoker,    Pulmonary exam normal breath sounds clear to auscultation       Cardiovascular Exercise Tolerance: Good hypertension,  Rhythm:Regular Rate:Normal     Neuro/Psych  Headaches, Anxiety Depression Bipolar Disorder    GI/Hepatic Neg liver ROS, GERD  Medicated and Controlled,  Endo/Other  Hypothyroidism   Renal/GU negative Renal ROS  negative genitourinary   Musculoskeletal  (+) Arthritis , Osteoarthritis,    Abdominal   Peds  Hematology negative hematology ROS (+)   Anesthesia Other Findings   Reproductive/Obstetrics negative OB ROS                            Anesthesia Physical Anesthesia Plan  ASA: II  Anesthesia Plan: General   Post-op Pain Management:    Induction: Intravenous  PONV Risk Score and Plan: 2 and Ondansetron and Dexamethasone  Airway Management Planned: LMA  Additional Equipment:   Intra-op Plan:   Post-operative Plan: Extubation in OR  Informed Consent: I have reviewed the patients History and Physical, chart, labs and discussed the procedure including the risks, benefits and alternatives for the proposed anesthesia with the patient or authorized representative who has indicated his/her understanding and acceptance.   Dental advisory given  Plan Discussed with: CRNA  Anesthesia Plan Comments:         Anesthesia Quick Evaluation

## 2018-02-11 ENCOUNTER — Ambulatory Visit (HOSPITAL_COMMUNITY): Payer: PPO | Admitting: Anesthesiology

## 2018-02-11 ENCOUNTER — Ambulatory Visit (HOSPITAL_COMMUNITY)
Admission: RE | Admit: 2018-02-11 | Discharge: 2018-02-11 | Disposition: A | Discharge status: Home / Self Care | Payer: PPO | Source: Ambulatory Visit | Attending: Neurology | Admitting: Neurology

## 2018-02-11 ENCOUNTER — Encounter (HOSPITAL_COMMUNITY): Payer: Self-pay | Admitting: Urology

## 2018-02-11 ENCOUNTER — Ambulatory Visit (HOSPITAL_COMMUNITY)
Admission: RE | Admit: 2018-02-11 | Discharge: 2018-02-11 | Disposition: A | Discharge status: Home / Self Care | Payer: PPO | Source: Ambulatory Visit | Attending: Diagnostic Radiology | Admitting: Diagnostic Radiology

## 2018-02-11 ENCOUNTER — Encounter (HOSPITAL_COMMUNITY): Admission: RE | Disposition: A | Discharge status: Home / Self Care | Payer: Self-pay | Source: Ambulatory Visit

## 2018-02-11 DIAGNOSIS — R270 Ataxia, unspecified: Secondary | ICD-10-CM

## 2018-02-11 DIAGNOSIS — E785 Hyperlipidemia, unspecified: Secondary | ICD-10-CM | POA: Insufficient documentation

## 2018-02-11 DIAGNOSIS — I129 Hypertensive chronic kidney disease with stage 1 through stage 4 chronic kidney disease, or unspecified chronic kidney disease: Secondary | ICD-10-CM | POA: Diagnosis not present

## 2018-02-11 DIAGNOSIS — M4802 Spinal stenosis, cervical region: Secondary | ICD-10-CM | POA: Insufficient documentation

## 2018-02-11 DIAGNOSIS — M1990 Unspecified osteoarthritis, unspecified site: Secondary | ICD-10-CM | POA: Diagnosis not present

## 2018-02-11 DIAGNOSIS — W19XXXA Unspecified fall, initial encounter: Secondary | ICD-10-CM

## 2018-02-11 DIAGNOSIS — N183 Chronic kidney disease, stage 3 (moderate): Secondary | ICD-10-CM | POA: Insufficient documentation

## 2018-02-11 DIAGNOSIS — E039 Hypothyroidism, unspecified: Secondary | ICD-10-CM | POA: Diagnosis not present

## 2018-02-11 DIAGNOSIS — R531 Weakness: Secondary | ICD-10-CM | POA: Diagnosis not present

## 2018-02-11 DIAGNOSIS — J449 Chronic obstructive pulmonary disease, unspecified: Secondary | ICD-10-CM | POA: Insufficient documentation

## 2018-02-11 DIAGNOSIS — I6782 Cerebral ischemia: Secondary | ICD-10-CM | POA: Diagnosis not present

## 2018-02-11 DIAGNOSIS — R413 Other amnesia: Secondary | ICD-10-CM | POA: Diagnosis not present

## 2018-02-11 DIAGNOSIS — R296 Repeated falls: Secondary | ICD-10-CM | POA: Insufficient documentation

## 2018-02-11 DIAGNOSIS — R251 Tremor, unspecified: Secondary | ICD-10-CM | POA: Diagnosis not present

## 2018-02-11 DIAGNOSIS — M48061 Spinal stenosis, lumbar region without neurogenic claudication: Secondary | ICD-10-CM | POA: Insufficient documentation

## 2018-02-11 DIAGNOSIS — M5031 Other cervical disc degeneration,  high cervical region: Secondary | ICD-10-CM | POA: Insufficient documentation

## 2018-02-11 DIAGNOSIS — F419 Anxiety disorder, unspecified: Secondary | ICD-10-CM | POA: Insufficient documentation

## 2018-02-11 DIAGNOSIS — M5136 Other intervertebral disc degeneration, lumbar region: Secondary | ICD-10-CM | POA: Insufficient documentation

## 2018-02-11 DIAGNOSIS — M4322 Fusion of spine, cervical region: Secondary | ICD-10-CM | POA: Diagnosis not present

## 2018-02-11 DIAGNOSIS — S0990XA Unspecified injury of head, initial encounter: Secondary | ICD-10-CM | POA: Diagnosis not present

## 2018-02-11 DIAGNOSIS — Z8249 Family history of ischemic heart disease and other diseases of the circulatory system: Secondary | ICD-10-CM | POA: Insufficient documentation

## 2018-02-11 DIAGNOSIS — F319 Bipolar disorder, unspecified: Secondary | ICD-10-CM | POA: Insufficient documentation

## 2018-02-11 DIAGNOSIS — S199XXA Unspecified injury of neck, initial encounter: Secondary | ICD-10-CM | POA: Diagnosis not present

## 2018-02-11 DIAGNOSIS — G3281 Cerebellar ataxia in diseases classified elsewhere: Secondary | ICD-10-CM

## 2018-02-11 DIAGNOSIS — R29898 Other symptoms and signs involving the musculoskeletal system: Secondary | ICD-10-CM

## 2018-02-11 DIAGNOSIS — K219 Gastro-esophageal reflux disease without esophagitis: Secondary | ICD-10-CM | POA: Insufficient documentation

## 2018-02-11 DIAGNOSIS — I10 Essential (primary) hypertension: Secondary | ICD-10-CM | POA: Diagnosis not present

## 2018-02-11 DIAGNOSIS — S3992XA Unspecified injury of lower back, initial encounter: Secondary | ICD-10-CM | POA: Diagnosis not present

## 2018-02-11 DIAGNOSIS — Z882 Allergy status to sulfonamides status: Secondary | ICD-10-CM | POA: Diagnosis not present

## 2018-02-11 DIAGNOSIS — R2681 Unsteadiness on feet: Secondary | ICD-10-CM | POA: Insufficient documentation

## 2018-02-11 DIAGNOSIS — Z888 Allergy status to other drugs, medicaments and biological substances status: Secondary | ICD-10-CM | POA: Diagnosis not present

## 2018-02-11 DIAGNOSIS — Z79899 Other long term (current) drug therapy: Secondary | ICD-10-CM | POA: Insufficient documentation

## 2018-02-11 DIAGNOSIS — Z87891 Personal history of nicotine dependence: Secondary | ICD-10-CM | POA: Diagnosis not present

## 2018-02-11 HISTORY — PX: RADIOLOGY WITH ANESTHESIA: SHX6223

## 2018-02-11 HISTORY — DX: Major depressive disorder, single episode, unspecified: F32.9

## 2018-02-11 HISTORY — DX: Depression, unspecified: F32.A

## 2018-02-11 HISTORY — DX: Fatty (change of) liver, not elsewhere classified: K76.0

## 2018-02-11 LAB — COMPREHENSIVE METABOLIC PANEL
ALBUMIN: 4.2 g/dL (ref 3.5–5.0)
ALK PHOS: 89 U/L (ref 38–126)
ALT: 33 U/L (ref 17–63)
AST: 25 U/L (ref 15–41)
Anion gap: 10 (ref 5–15)
BUN: 20 mg/dL (ref 6–20)
CALCIUM: 9.5 mg/dL (ref 8.9–10.3)
CHLORIDE: 104 mmol/L (ref 101–111)
CO2: 25 mmol/L (ref 22–32)
Creatinine, Ser: 1.35 mg/dL — ABNORMAL HIGH (ref 0.61–1.24)
GFR calc Af Amer: >60 mL/min (ref >60)
GFR calc non Af Amer: 54 mL/min — ABNORMAL LOW (ref >60)
GLUCOSE: 117 mg/dL — AB (ref 65–99)
Potassium: 3.8 mmol/L (ref 3.5–5.1)
SODIUM: 139 mmol/L (ref 135–145)
Total Bilirubin: 0.9 mg/dL (ref 0.3–1.2)
Total Protein: 6.9 g/dL (ref 6.5–8.1)

## 2018-02-11 SURGERY — MRI WITH ANESTHESIA
Anesthesia: General

## 2018-02-11 MED ORDER — GADOBENATE DIMEGLUMINE 529 MG/ML IV SOLN: 20.0000 mL | Freq: Once | INTRAVENOUS | Status: DC

## 2018-02-11 MED ORDER — PROPOFOL 10 MG/ML IV BOLUS
INTRAVENOUS | Status: AC
  Filled 2018-02-11: qty 20

## 2018-02-11 MED ORDER — MIDAZOLAM HCL 2 MG/2ML IJ SOLN
INTRAMUSCULAR | Status: AC
  Filled 2018-02-11: qty 2

## 2018-02-11 MED ORDER — LACTATED RINGERS IV SOLN
INTRAVENOUS | Status: DC
  Administered 2018-02-11: 07:00:00 via INTRAVENOUS

## 2018-02-11 MED ORDER — FENTANYL CITRATE (PF) 250 MCG/5ML IJ SOLN
INTRAMUSCULAR | Status: AC
  Filled 2018-02-11: qty 5

## 2018-02-11 NOTE — Discharge Instructions (Signed)

## 2018-02-11 NOTE — Transfer of Care (Signed)
Immediate Anesthesia Transfer of Care Note  Patient: Reginald Tucker  Procedure(s) Performed: MRI OF BRAIN WITHOUT CONTRAST AND LUMBER SPINE AND CERVICAL (N/A )  Patient Location: PACU  Anesthesia Type:General  Level of Consciousness: awake, alert , oriented and patient cooperative  Airway & Oxygen Therapy: Patient Spontanous Breathing and Patient connected to nasal cannula oxygen  Post-op Assessment: Report given to RN and Post -op Vital signs reviewed and stable  Post vital signs: Reviewed and stable  Last Vitals:  Vitals:   02/11/18 0626 02/11/18 1031  BP:    Pulse:    Resp:    Temp: (!) 36.4 C 36.8 C  SpO2:      Last Pain:  Vitals:   02/11/18 1031  PainSc: 0-No pain      Patients Stated Pain Goal: 2 (83/66/29 4765)  Complications: No apparent anesthesia complications

## 2018-02-11 NOTE — Anesthesia Postprocedure Evaluation (Signed)
Anesthesia Post Note  Patient: DUTCH ING  Procedure(s) Performed: MRI OF BRAIN WITHOUT CONTRAST AND LUMBER SPINE AND CERVICAL (N/A )     Patient location during evaluation: PACU Anesthesia Type: General Level of consciousness: awake and alert Pain management: pain level controlled Vital Signs Assessment: post-procedure vital signs reviewed and stable Respiratory status: spontaneous breathing, nonlabored ventilation and respiratory function stable Cardiovascular status: blood pressure returned to baseline and stable Postop Assessment: no apparent nausea or vomiting Anesthetic complications: no    Last Vitals:  Vitals:   02/11/18 1035 02/11/18 1113  BP: 104/76 105/80  Pulse: 99 99  Resp: 15 16  Temp:    SpO2: 91% 92%    Last Pain:  Vitals:   02/11/18 1031  PainSc: 0-No pain                 Dyshawn Cangelosi,W. EDMOND

## 2018-02-12 ENCOUNTER — Encounter (HOSPITAL_COMMUNITY): Payer: Self-pay | Admitting: Radiology

## 2018-02-12 DIAGNOSIS — F3132 Bipolar disorder, current episode depressed, moderate: Secondary | ICD-10-CM | POA: Diagnosis not present

## 2018-02-13 MED FILL — Phenylephrine-NaCl Pref Syr 0.4 MG/10ML-0.9% (40 MCG/ML): INTRAVENOUS | Qty: 10 | Status: AC

## 2018-02-13 MED FILL — Sugammadex Sodium IV 200 MG/2ML (Base Equivalent): INTRAVENOUS | Qty: 2 | Status: AC

## 2018-02-13 MED FILL — Ephedrine Sulf-NaCl Soln Pref Syr 50 MG/10ML-0.9% (5 MG/ML): INTRAVENOUS | Qty: 10 | Status: AC

## 2018-02-13 MED FILL — Fentanyl Citrate Preservative Free (PF) Inj 100 MCG/2ML: INTRAMUSCULAR | Qty: 2 | Status: AC

## 2018-02-13 MED FILL — Ondansetron HCl Inj 4 MG/2ML (2 MG/ML): INTRAMUSCULAR | Qty: 2 | Status: AC

## 2018-02-13 MED FILL — Lidocaine HCl Local Soln Prefilled Syringe 100 MG/5ML (2%): INTRAMUSCULAR | Qty: 5 | Status: AC

## 2018-02-13 MED FILL — Lactated Ringer's Solution: INTRAVENOUS | Qty: 1000 | Status: AC

## 2018-02-13 MED FILL — Rocuronium Bromide IV Soln Pref Syringe 50 MG/5ML (10 MG/ML): INTRAVENOUS | Qty: 5 | Status: AC

## 2018-02-17 ENCOUNTER — Telehealth: Payer: Self-pay | Admitting: Neurology

## 2018-02-17 NOTE — Telephone Encounter (Signed)
MRI of the brain, cervical spine and lumbar spine did not show any reason for patient's symptoms ie the leg weakness. On MRI cervical spine showed a lot of arthritis that could cause arm pain and weakness but nothing that would affect his spinal cord or his legs. The MRI of the lumbar spine showed mild arthritis but no nerve pinching or anything to cause his leg weakness. The MRI of the brain was overall unremarkable for his age. I'd like to review it all with them. We can set up an emg/ncs for her and a follow up for him in the same day. I think I have slots on Thursday if they are interested. thanks

## 2018-02-17 NOTE — Telephone Encounter (Addendum)
Spoke with pt's wife [name] (on [place]). Discussed the following from Dr. Jaynee Eagles:   MRI of the brain, cervical spine and lumbar spine did not show any reason for patient's symptoms ie the leg weakness. On MRI cervical spine showed a lot of arthritis that could cause arm pain and weakness but nothing that would affect his spinal cord or his legs. The MRI of the lumbar spine showed mild arthritis but no nerve pinching or anything to cause his leg weakness. The MRI of the brain was overall unremarkable for his age. I'd like to review it all with them. We can set up an emg/ncs for her and a follow up for him in the same day.  The pt's wife verbalized understanding. She has does have further questions as they were told the patient has a bulging disc. Offered pt's wife a f/u appt for the patient to discuss all of this with Dr. Jaynee Eagles on Thursday. She verbalized appreciation. Scheduled pt for 10:30 on 3/14 arrival time 10:00. The wife will be seen afterward.  Spoke with [facility] to discuss the letter that was written for insurance. Spoke with Erlene Quan. He stated there is no guarantee but a letter could be sent to the insurance company. The patient will receive two separate bills, one for anesthesia and one for the MRI. He suggested that best option is likely to wait and see what the bills are and let it process through insurance as there is no way to know what the cost is at this point.

## 2018-02-17 NOTE — Telephone Encounter (Signed)
Pts wife called stating that Dr that schedule pts Lumbar is able to see all the results and has faxed everything over, pts wife is requesting a call back to discuss.

## 2018-02-20 ENCOUNTER — Ambulatory Visit: Payer: PPO | Admitting: Neurology

## 2018-02-20 ENCOUNTER — Encounter: Payer: Self-pay | Admitting: Neurology

## 2018-02-20 ENCOUNTER — Encounter (INDEPENDENT_AMBULATORY_CARE_PROVIDER_SITE_OTHER): Payer: Self-pay

## 2018-02-20 VITALS — BP 104/72 | HR 110 | Ht 72.0 in | Wt 233.0 lb

## 2018-02-20 DIAGNOSIS — R29898 Other symptoms and signs involving the musculoskeletal system: Secondary | ICD-10-CM

## 2018-02-20 DIAGNOSIS — R270 Ataxia, unspecified: Secondary | ICD-10-CM

## 2018-02-20 DIAGNOSIS — W19XXXD Unspecified fall, subsequent encounter: Secondary | ICD-10-CM

## 2018-02-20 NOTE — Patient Instructions (Signed)
Asterixis/negative myoclonus

## 2018-02-20 NOTE — Progress Notes (Signed)
[facility]    Provider:  Dr Jaynee Eagles Referring Provider: [name], MD Primary Care Physician:  [name], MD, [facility]  CC:  Imbalance  Interval history 02/20/2018: Patient is here for follow-up on leg weakness, falls, imbalance and a new problem twitching in his legs and memory loss.  Extensive testing was performed.  EMG nerve conduction study on the bilateral lower extremities was normal, no suggestion of neuromuscular disorders, mononeuropathy, polyneuropathy or radiculopathy.  MRI was overall unremarkable there was a chronic microhemorrhage in the lateral right thalamus and minimal to mild for age white matter changes likely chronic microvascular ischemia, MRI of the cervical spine showed multilevel degenerative changes and foraminal stenosis however no central canal stenosis, normal cervical cord,  Reviewed all images of MRI and showed them to patient and answered questions.  I did not order MRI of the lumbar spine and that will be discussed with ordering physician and patient however no significant foraminal stenosis or central canal stenosis or etiology for leg symptoms.  Today we will order some lab tests. Wife provides much information.   HPI:  Reginald Tucker is a 62 y.o. male here as a referral from Dr. Kenton Kingfisher for imbalance. PMHx bipolar disorder on multiple medications, hypertension, hypothyroidism, insomnia, COPD, tinnitus, hyperlipidemia, memory changes, morbid obesity, prediabetes, hyperlipidemia, stage III chronic kidney disease, unsteadiness on feet. He started stumbling a few years ago with random falls. Progressively worsened. Patient here with wife who provides information. His legs feel like Jelly and he has fallen.He reports tremors. His legs feel like jelly and then collapse. He was going down the steps and his legs felt like jelly. He has had multiple falls. Weakness of legs, lasts briefly but can have weakness that continues. Sense  of smell is fine. Difficulty picking legs up but that has been ongoing since the 90s for decades, not new. No changes in voice volume, expressions of the face, no REM sleep disorder.  Tremors are sporadic, not today, but happens a lot on the morning, lasts an hour or two in the morning before eating. No neck pain, no weakness or symptoms in the arms, all lower extremity.No walking or exercises. Denies sensory changes in the feet or neuropathy signs/symptoms.  Reviewed notes, labs and imaging from outside physicians, which showed:   Reviewed referring physician notes.  Legs giving out.  He was at [facility] and he felt like his legs were like jelly and when he got up from a CT fell backwards striking the back of his head on his left occipital region on the table leg.  He did not lose consciousness.  He has not had any subsequent nausea or vomiting.  His wife reports that he seems to not lift up his feet when he walks which may be contributing to his recent fall stumble.  He apparently had 3 falls after the snowstorm in December 2018.  He is on a number of mood stabilizing medicines prescribed by his psychiatrist, Dr. Charlott Holler.  They discussed his symptoms with psychiatry last week but psychiatry did not feel that his mood stabilizing medications were likely the cause of his current symptoms from a side effect standpoint.  He has not had any recent dose adjustments or new medications from a mood stabilizing standpoint.  He has episodes where he has dragged his feet on previously over the years and they have apparently commented on this on fall risk assessment.  Patient was referred for physical therapy to help with decreasing  risk for falling in June 2018 S3 falls were documented on fall risk assessment at that time he did not attend.  His most recent lab work including Sehili and TSH were unremarkable earlier this month December 2018. TSh was normal.   FINDINGS: Lumbar alignment within normal limits. Vertebral body  heights are normal. Mild to moderate disc space narrowing at L2-L3, L3-L4 and L5-S1 with anterior osteophytes. Posterior facet disease of the lower lumbar spine.  IMPRESSION: 1. Mild-to-moderate degenerative changes 2. No acute osseous abnormality.   CMP 24, creatinine 1.34 11/28/2017   Review of Systems: Patient complains of symptoms per HPI as well as the following symptoms: Weight loss, fatigue, memory loss, weakness, sleepiness, snoring, hearing loss, ringing in ears, depression, too much sleep, not enough sleep, decreased energy, disinterest in activities. Pertinent negatives and positives per HPI. All others negative.   Social History   Socioeconomic History  . Marital status: Married    Spouse name: Not on file  . Number of children: Not on file  . Years of education: Not on file  . Highest education level: Associate degree: occupational, Hotel manager, or vocational program  Social Needs  . Financial resource strain: Not on file  . Food insecurity - worry: Not on file  . Food insecurity - inability: Not on file  . Transportation needs - medical: Not on file  . Transportation needs - non-medical: Not on file  Occupational History  . 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 Smoking status: Former Smoker    Packs/day: 1.00    Years: 25.00    Pack years: 25.00    Types: Cigarettes    Last attempt to quit: 12/10/2000    Years since quitting: 17.2  . Smokeless tobacco: Never Used  . Tobacco comment: heavy vape user- quit vaping 06/04/2017  Substance and Sexual Activity  . Alcohol use: No    Alcohol/week: 0.0 oz  . Drug use: No  . Sexual activity: Not on file  Other Topics Concern  . Not on file  Social History Narrative   Admitted to [facility] 06/14/17- discharged   Lives at home with his wife   Married - [name]   Former smoker - stopped 2002   Alcohol none   Full code   Right handed   Drinks 2 cups of caffeine daily    Family History  Problem Relation Age of  Onset  . Cancer Mother   . Hyperlipidemia Father   . Hypertension Father   . CAD Father   . Stroke Father   . Aortic aneurysm Father   . Prostate cancer Father   . Hyperlipidemia Brother   . Appendicitis Maternal Grandfather     Past Medical History:  Diagnosis Date  . Acquired hallux rigidus of right foot 04/26/2017  . AKI (acute kidney injury) ([place]) 06/11/2017  . Anxiety   . Arthritis   . Bipolar 1 disorder ([place])   . BMI 37.0-37.9, adult   . BPH (benign prostatic hyperplasia)   . Cataract   . Cataract    L eye  . CKD (chronic kidney disease), stage III ([place])   . Colon polyps   . COPD GOLD II with restrictive component  01/13/2016   Spirometry 01/13/2016  FEV1 1.84 (47%)  Ratio 62  - 01/13/2016  extensive coaching HFA effectiveness =    90% > try stiolto respimat 2 pffs each am > did not benefit so stopped when sample out - 01/13/2016  Walked RA x 3  laps @ 185 ft each stopped due to  End of study, nl pace, no desat  / min sob  - PFT's  03/16/2016  FEV1 2.28 (59 % ) ratio 67  p 12 % improvement from saba p no prior to study with DLCO  66 % corrects to 86 % for alv volume     . Depression   . Essential hypertension 01/19/2015  . Family history of coronary arteriosclerosis 01/19/2015   Father with MI   . Fatty liver   . GERD (gastroesophageal reflux disease) 08/11/2014  . Headache    Aura without headache  . History of colon polyps 06/18/2017  . Hyperlipidemia   . Hypertension   . Hypothyroidism 08/11/2014  . Hypothyroidism   . Insomnia 06/18/2017  . Insomnia   . Memory change   . Mixed hyperlipidemia 06/18/2017  . Morbid obesity ([place]) 01/18/9370   Complicated by HBP/ Low erv on pfts 03/16/2016 (31%)    . Morbid obesity due to excess calories ([place])   . Prediabetes   . Shortness of breath 06/18/2017  . SOB (shortness of breath)   . Thyroid disease   . Tinnitus of both ears 06/18/2017  . Tobacco use 06/18/2017  . Unsteadiness on feet   . Vitamin D deficiency 06/18/2017  . Vitamin D deficiency       Past Surgical History:  Procedure Laterality Date  . COLONOS    . COLONSCOPY    . CYST EXCISION  12/24/2016   sebaceous cyst chest wall  Dr. Hulen Skains  . MASS EXCISION Left 12/24/2016   Procedure: EXCISION LEFT CHEST WALL SEBACEOUS CYST;  Surgeon: Judeth Horn, MD;  Location: [facility];  Service: General;  Laterality: Left;  . RADIOLOGY WITH ANESTHESIA N/A 02/11/2018   Procedure: MRI OF BRAIN WITHOUT CONTRAST AND LUMBER SPINE AND CERVICAL;  Surgeon: Radiologist, Medication, MD;  Location: [facility];  Service: Radiology;  Laterality: N/A;  . root tip removal    . TOE FUSION Right 2018    Current Outpatient Medications  Medication Sig Dispense Refill  . Acetylcysteine (N-ACETYL-L-CYSTEINE) 600 MG CAPS Take 600 mg by mouth daily.    . Ascorbic Acid (VITAMIN C) POWD Take 520 mg by mouth daily.    Marland Kitchen CALCIUM-MAG-VIT C-VIT D PO Take 4 tablets by mouth daily.    . Cholecalciferol (VITAMIN D3 PO) Take 15,000 Units by mouth daily.     Marland Kitchen L-Theanine 100 MG CAPS Take 100 mg by mouth daily.    Marland Kitchen lamoTRIgine (LAMICTAL) 100 MG tablet Take 100-200 mg by mouth See admin instructions. 1 pill in AM 2 pills in PM    . levothyroxine (SYNTHROID, LEVOTHROID) 150 MCG tablet Take 150 mcg by mouth daily before breakfast.    . lovastatin (MEVACOR) 40 MG tablet Take 40 mg by mouth daily.     . Omega-3 Fatty Acids (SUPER OMEGA 3 PO) Take 4 capsules by mouth daily.    Marland Kitchen omeprazole (PRILOSEC) 20 MG capsule Take 20 mg by mouth daily.     Marland Kitchen OVER THE COUNTER MEDICATION Place 4 Squirts under the tongue daily. Hemp oil    . OVER THE COUNTER MEDICATION Take 1 tablet by mouth daily. EpiCor    . QUEtiapine (SEROQUEL) 400 MG tablet Take 400 mg by mouth at bedtime.     . risperiDONE (RISPERDAL) 1 MG tablet Take 1.5 mg by mouth at bedtime.     . selegiline (ELDEPRYL) 5 MG tablet Take 20 mg by mouth 2 (two) times  daily with a meal. AM dose and second dose 4 hours later    . vitamin B-12 (CYANOCOBALAMIN) 1000 MCG  tablet Take 1,000 mcg by mouth daily.     No current facility-administered medications for this visit.     Allergies as of 02/20/2018 - Review Complete 02/20/2018  Allergen Reaction Noted  . Sulfa antibiotics  06/10/2017  . Sulfamethoxazole  01/19/2015  . Ambien [zolpidem tartrate] Anxiety 01/19/2015    Vitals: BP 104/72 (BP Location: Left Arm, Patient Position: Sitting)   Pulse (!) 110   Ht 6' (1.829 m)   Wt 233 lb (105.7 kg)   BMI 31.60 kg/m  Last Weight:  Wt Readings from Last 1 Encounters:  02/20/18 233 lb (105.7 kg)   Last Height:   Ht Readings from Last 1 Encounters:  02/20/18 6' (1.829 m)    Physical exam: Exam: Gen: NAD, conversant, well nourised, morbidly obese, well groomed                     CV: RRR, no MRG. No Carotid Bruits. No peripheral edema, warm, nontender Eyes: Conjunctivae clear without exudates or hemorrhage  Neuro: Detailed Neurologic Exam  Speech:    Speech is normal; fluent and spontaneous with normal comprehension.  Cognition:    The patient is oriented to person, place, and time;     recent and remote memory intact;     language fluent;     normal attention, concentration,     fund of knowledge Cranial Nerves:    The pupils are equal, round, and reactive to light. Attempted fundoscopic exam could not visualize.  Visual fields are full to finger confrontation. Extraocular movements are intact. Trigeminal sensation is intact and the muscles of mastication are normal. The face is symmetric. The palate elevates in the midline. Hearing intact. Voice is normal. Shoulder shrug is normal. The tongue has normal motion without fasciculations.   Coordination:    Normal finger to nose  Gait:    Heel-toe and tandem gait are normal. Good stride and arm swing.   Motor Observation:    No asymmetry, no atrophy, very minimal postural tremor, no resting tremor, no asterixis Tone:    Normal muscle tone.    Posture:    Very minimally stooped     Strength:    Strength is V/V in the upper and lower limbs.      Sensation: intact to LT, Romberg negative     Reflex Exam:  DTR's:    Deep tendon reflexes in the upper and lower extremities are normal bilaterally.   Toes:    The toes are downgoing bilaterally (right toe is post-surgically) Clonus:    Clonus is absent.      Assessment/Plan:   62 y.o. male here as a referral from Dr. Kenton Kingfisher for imbalance. PMHx bipolar disorder on multiple medications, hypertension, hypothyroidism, insomnia, COPD, tinnitus, hyperlipidemia, memory changes, morbid obesity, prediabetes, hyperlipidemia, stage III chronic kidney disease, unsteadiness on feet. Multiple falls, imbalance, gait abnormality, ataxia.    Extensive testing was performed.    - EMG nerve conduction study on the bilateral lower extremities was normal, no suggestion of neuromuscular disorders, mononeuropathy, polyneuropathy or radiculopathy.   - MRI brain was overall unremarkable there was a chronic microhemorrhage in the lateral right thalamus and minimal to mild for age white matter changes likely chronic microvascular ischemia,  - MRI of the cervical spine showed multilevel degenerative changes and foraminal stenosis however no central canal stenosis, normal cervical cord,   -  MRI of the lumbar spine no significant foraminal stenosis or central canal stenosis or etiology for leg symptoms.   - Patient is on multiple medications, may be asterixis which can be caused by multiple psychotropic medications. Recommend follow up with psychiatrist.  - Neurologic exam is non focal and reflexes are all normal. A contribution may be from sedentary lifestyle and large body habitus.   - He is not walking, no exercising, has not been to PT. Referral to PT for gait ataxia and falls.   - No parkinsonian features on exam  - Discussed weight loss and exercise  - Discussed fall precautions.  - will complete workup with extensive labs, but no  neurologic cause for symptoms found. Again, feel asterixis from polypharmacy could be the etiology.  Orders Placed This Encounter  Procedures  . Acetylcholine receptor, binding  . Acetylcholine receptor, blocking  . Acetylcholine receptor, modulating  . TSH  . ANA w/Reflex  . B12 and Folate Panel  . RPR  . Hepatitis C antibody  . Heavy metals, blood  . Vitamin B6  . Methylmalonic acid, serum  . Vitamin B1  . Hemoglobin A1c  . CK   Cc: Dr. Kenton Kingfisher, Dr. Rich Reining, MD  [facility] [address] [place], [place] [address]  Phone [phone] Fax [phone]  A total of 30 minutes was spent in with this patient face-to-face. Over half this time was spent on counseling patient on the leg weakness, ataxia, falls diagnosis and different therapeutic options available.

## 2018-02-26 ENCOUNTER — Telehealth: Payer: Self-pay | Admitting: *Deleted

## 2018-02-26 NOTE — Telephone Encounter (Signed)
Spoke with both pt & wife Reginald Tucker (on [place]) regarding lab results. They are aware that labs are unremarkable. Dr. Jaynee Eagles still thinks this is medication related. No neurologic cause found in the labs. Pt's wife verbalized understanding. She also asked if Dr. Jaynee Eagles had written the letter to pt's psychiatrist yet. Pt has an appt in April but his wife was going to try to get him in sooner if the letter had been sent. RN did not see this in the chart but informed Reginald Tucker that she would send message to Dr. Jaynee Eagles. She verbalized appreciation.

## 2018-02-26 NOTE — Telephone Encounter (Signed)
-----   Message from Melvenia Beam, MD sent at 02/26/2018 12:15 PM EDT ----- Labs are unremarkable. I still think this is medication related. No neurologic cause found in the labs thanks

## 2018-02-27 NOTE — Telephone Encounter (Signed)
Spoke with the patient's wife [name] and informed her that Dr. Jaynee Eagles had forwarded her office note to the pt's psychiatrist instead of a letter. She verbalized appreciation and had no questions.

## 2018-02-27 NOTE — Telephone Encounter (Signed)
I am not writing a letter. I did forward my office note to his psychiatrist who should have it. thanks

## 2018-02-28 LAB — CK: CK TOTAL: 230 U/L — AB (ref 24–204)

## 2018-02-28 LAB — ACETYLCHOLINE RECEPTOR, BLOCKING: Acetylchol Block Ab: 17 % (ref 0–25)

## 2018-02-28 LAB — TSH: TSH: 1.14 u[IU]/mL (ref 0.450–4.500)

## 2018-02-28 LAB — ACETYLCHOLINE RECEPTOR, MODULATING: Acetylcholine Modulat Ab: <12 % (ref 0–20)

## 2018-02-28 LAB — HEMOGLOBIN A1C
ESTIMATED AVERAGE GLUCOSE: 105 mg/dL
Hgb A1c MFr Bld: 5.3 % (ref 4.8–5.6)

## 2018-02-28 LAB — B12 AND FOLATE PANEL
Folate: 8.2 ng/mL (ref >3.0)
VITAMIN B 12: 803 pg/mL (ref 232–1245)

## 2018-02-28 LAB — HEAVY METALS, BLOOD
Arsenic: 5 ug/L (ref 2–23)
Lead, Blood: 1 ug/dL (ref 0–4)
Mercury: 2.6 ug/L (ref 0.0–14.9)

## 2018-02-28 LAB — VITAMIN B1: THIAMINE: 150.8 nmol/L (ref 66.5–200.0)

## 2018-02-28 LAB — METHYLMALONIC ACID, SERUM: Methylmalonic Acid: 329 nmol/L (ref 0–378)

## 2018-02-28 LAB — ACETYLCHOLINE RECEPTOR, BINDING: AChR Binding Ab, Serum: <0.03 nmol/L (ref 0.00–0.24)

## 2018-02-28 LAB — ANA W/REFLEX: ANA: NEGATIVE

## 2018-02-28 LAB — VITAMIN B6: Vitamin B6: 6.8 ug/L (ref 5.3–46.7)

## 2018-02-28 LAB — RPR: RPR: NONREACTIVE

## 2018-02-28 LAB — HEPATITIS C ANTIBODY: HEP C VIRUS AB: 0.2 {s_co_ratio} (ref 0.0–0.9)

## 2018-03-04 ENCOUNTER — Other Ambulatory Visit: Payer: Self-pay

## 2018-03-04 ENCOUNTER — Ambulatory Visit: Payer: PPO | Attending: Neurology | Admitting: Physical Therapy

## 2018-03-04 ENCOUNTER — Encounter: Payer: Self-pay | Admitting: Physical Therapy

## 2018-03-04 DIAGNOSIS — M6281 Muscle weakness (generalized): Secondary | ICD-10-CM

## 2018-03-04 DIAGNOSIS — R2681 Unsteadiness on feet: Secondary | ICD-10-CM

## 2018-03-04 DIAGNOSIS — R2689 Other abnormalities of gait and mobility: Secondary | ICD-10-CM | POA: Diagnosis not present

## 2018-03-04 NOTE — Therapy (Signed)
[facility] [address]  [place], [place], [address] Phone: [phone]   Fax:  [phone]  Physical Therapy Evaluation  Patient Details  Name: Reginald Tucker MRN: [id number] Date of Birth: 1956-10-21 Referring Provider: Dr. Jaynee Eagles   Encounter Date: 03/04/2018  Reginald Tucker End of Session - 03/04/18 1102    Visit Number  1    Number of Visits  8    Date for Reginald Tucker Re-Evaluation  04/08/18    Authorization Type  [employer]    Reginald Tucker Start Time  1014    Reginald Tucker Stop Time  1051    Reginald Tucker Time Calculation (min)  37 min    Activity Tolerance  Patient tolerated treatment well    Behavior During Therapy  [facility] for tasks assessed/performed       Past Medical History:  Diagnosis Date  . Acquired hallux rigidus of right foot 04/26/2017  . AKI (acute kidney injury) ([place]) 06/11/2017  . Anxiety   . Arthritis   . Bipolar 1 disorder ([place])   . BMI 37.0-37.9, adult   . BPH (benign prostatic hyperplasia)   . Cataract   . Cataract    L eye  . CKD (chronic kidney disease), stage III ([place])   . Colon polyps   . COPD GOLD II with restrictive component  01/13/2016   Spirometry 01/13/2016  FEV1 1.84 (47%)  Ratio 62  - 01/13/2016  extensive coaching HFA effectiveness =    90% > try stiolto respimat 2 pffs each am > did not benefit so stopped when sample out - 01/13/2016  Walked RA x 3 laps @ 185 ft each stopped due to  End of study, nl pace, no desat  / min sob  - PFT's  03/16/2016  FEV1 2.28 (59 % ) ratio 67  p 12 % improvement from saba p no prior to study with DLCO  66 % corrects to 86 % for alv volume     . Depression   . Essential hypertension 01/19/2015  . Family history of coronary arteriosclerosis 01/19/2015   Father with MI   . Fatty liver   . GERD (gastroesophageal reflux disease) 08/11/2014  . Headache    Aura without headache  . History of colon polyps 06/18/2017  . Hyperlipidemia   . Hypertension   . Hypothyroidism 08/11/2014  . Hypothyroidism   . Insomnia  06/18/2017  . Insomnia   . Memory change   . Mixed hyperlipidemia 06/18/2017  . Morbid obesity ([place]) 02/11/3613   Complicated by HBP/ Low erv on pfts 03/16/2016 (31%)    . Morbid obesity due to excess calories ([place])   . Prediabetes   . Shortness of breath 06/18/2017  . SOB (shortness of breath)   . Thyroid disease   . Tinnitus of both ears 06/18/2017  . Tobacco use 06/18/2017  . Unsteadiness on feet   . Vitamin D deficiency 06/18/2017  . Vitamin D deficiency     Past Surgical History:  Procedure Laterality Date  . COLONOS    . COLONSCOPY    . CYST EXCISION  12/24/2016   sebaceous cyst chest wall  Dr. Hulen Skains  . MASS EXCISION Left 12/24/2016   Procedure: EXCISION LEFT CHEST WALL SEBACEOUS CYST;  Surgeon: Judeth Horn, MD;  Location: [place];  Service: General;  Laterality: Left;  . RADIOLOGY WITH ANESTHESIA N/A 02/11/2018   Procedure: MRI OF BRAIN WITHOUT CONTRAST AND LUMBER SPINE AND CERVICAL;  Surgeon: Radiologist, Medication, MD;  Location: [place];  Service: Radiology;  Laterality: N/A;  . root tip removal    . TOE FUSION Right 2018    There were no vitals filed for this visit.   Subjective Assessment - 03/04/18 1015    Subjective  A few months ago - fell at home - legs felt like jello; reports some jerking in arms and legs in the morning. Quite a few falls - just feels like legs give way. Denies N&T. Normal sensation at B feet. No pain.    Pertinent History  bipolar, CKD stage III, depression, HTN    Diagnostic tests  MRI of brain, C-spine, and L-spine: largely unremarkable, some facet arthritis of C-spine    Patient Stated Goals  improve balance and strength    Currently in Pain?  No/denies    Pain Score  0-No pain         [facility] Reginald Tucker Assessment - 03/04/18 1017      Assessment   Medical Diagnosis  Gait abnormality    Referring Provider  Dr. Jaynee Eagles    Next MD Visit  prn    Prior Therapy  no      Precautions   Precautions  None      Restrictions   Weight  Bearing Restrictions  No      Balance Screen   Has the patient fallen in the past 6 months  Yes    How many times?  3    Has the patient had a decrease in activity level because of a fear of falling?   Yes    Is the patient reluctant to leave their home because of a fear of falling?   No      Home Environment   Living Environment  Private residence    Living Arrangements  Spouse/significant other    Type of [place] to enter    Entrance Stairs-Number of Steps  3    [place]  One level sunken den    Engineer, production bars - tub/shower      Prior Function   Level of Independence  Independent    Vocation  Retired    Leisure  walking Designer, jewellery   Overall Cognitive Status  Within Functional Limits for tasks assessed      Sensation   Light Touch  Appears Intact      Coordination   Gross Motor Movements are Fluid and Coordinated  Yes      Posture/Postural Control   Posture/Postural Control  No significant limitations      ROM / Strength   AROM / PROM / Strength  AROM;Strength      AROM   Overall AROM   Within functional limits for tasks performed    Overall AROM Comments  B LE      Strength   Strength Assessment Site  Hip;Knee    Right/Left Hip  Right;Left    Right Hip Flexion  4-/5    Left Hip Flexion  4/5    Right/Left Knee  Right;Left    Right Knee Flexion  4/5    Right Knee Extension  4/5    Left Knee Flexion  4/5    Left Knee Extension  4/5      Balance   Balance Assessed  Yes      Standardized Balance Assessment   Standardized Balance Assessment  Berg Balance Test;Dynamic Gait Index;Five Times Sit  to Stand    Five times sit to stand comments   13.35      Berg Balance Test   Sit to Stand  Able to stand without using hands and stabilize independently    Standing Unsupported  Able to stand safely 2 minutes    Sitting with Back Unsupported but Feet Supported on Floor or Stool  Able to sit safely and securely 2 minutes     Stand to Sit  Sits safely with minimal use of hands    Transfers  Able to transfer safely, minor use of hands    Standing Unsupported with Eyes Closed  Able to stand 10 seconds safely    Standing Ubsupported with Feet Together  Able to place feet together independently and stand 1 minute safely    From Standing, Reach Forward with Outstretched Arm  Can reach forward >12 cm safely (5")    From Standing Position, Pick up Object from Floor  Able to pick up shoe safely and easily    From Standing Position, Turn to Look Behind Over each Shoulder  Looks behind from both sides and weight shifts well    Turn 360 Degrees  Able to turn 360 degrees safely but slowly    Standing Unsupported, Alternately Place Feet on Step/Stool  Able to stand independently and safely and complete 8 steps in 20 seconds    Standing Unsupported, One Foot in Front  Able to plae foot ahead of the other independently and hold 30 seconds    Standing on One Leg  Able to lift leg independently and hold 5-10 seconds    Total Score  51      Dynamic Gait Index   Level Surface  Normal    Change in Gait Speed  Mild Impairment    Gait with Horizontal Head Turns  Mild Impairment    Gait with Vertical Head Turns  Normal    Gait and Pivot Turn  Mild Impairment    Step Over Obstacle  Mild Impairment    Step Around Obstacles  Normal    Steps  Mild Impairment    Total Score  19              No data recorded  Objective measurements completed on examination: See above findings.              Reginald Tucker Education - 03/04/18 1102    Education provided  Yes    Education Details  exam findings, POC, HEP    Person(s) Educated  Patient    Methods  Explanation;Demonstration;Handout    Comprehension  Verbalized understanding;Returned demonstration;Need further instruction          Reginald Tucker Long Term Goals - 03/04/18 1105      Reginald Tucker LONG TERM GOAL #1   Title  patient to be independent with advanced HEP for balance and  strengthening    Status  New    Target Date  04/08/18      Reginald Tucker LONG TERM GOAL #2   Title  patient to improve Berg Balance to >/= 54/56 demonstrating improved balance    Status  New    Target Date  04/08/18      Reginald Tucker LONG TERM GOAL #3   Title  patient to improve DGI to >/= 22 demonstrating improved balance    Status  New    Target Date  04/08/18      Reginald Tucker LONG TERM GOAL #4   Title  patient to improve B LE  strength to >/= 4+/5 for improved function    Status  New    Target Date  04/08/18      Reginald Tucker LONG TERM GOAL #5   Title  patient to report no LOB or falls for >/= 1 month    Status  New    Target Date  04/08/18             Plan - 03/04/18 1103    Clinical Impression Statement  Mr. Dudek is a pleasant 62 y/o male presenting to OPPT today regarding primary complaints of a history of falls, and general weakness. Per chart review, patient falling 3-5 times in the past 6 months. Patient today scoring in moderate fall risk with both Berg Balance and DGI - some difficulty following directions with DGI with impulsivity noted. Patient today given initial HEP for functional strengthening. Will likely plan to review OTAGO at next visit for home balance program. Patient to benefit from skilled Reginald Tucker to address fall risk and improve functional mobility.     Clinical Presentation  Stable    Clinical Decision Making  Low    Rehab Potential  Good    Reginald Tucker Frequency  2x / week    Reginald Tucker Duration  4 weeks    Reginald Tucker Treatment/Interventions  ADLs/Self Care Home Management;Cryotherapy;Electrical Stimulation;Moist Heat;Therapeutic exercise;Therapeutic activities;Functional mobility training;Stair training;Gait training;Balance training;Neuromuscular re-education;Patient/family education;Manual techniques;Vasopneumatic Device;Taping;Dry needling;Passive range of motion    Consulted and Agree with Plan of Care  Patient       Patient will benefit from skilled therapeutic intervention in order to improve the following  deficits and impairments:  Decreased strength, Decreased balance, Decreased mobility  Visit Diagnosis: Unsteadiness on feet  Other abnormalities of gait and mobility  Muscle weakness (generalized)     Problem List Patient Active Problem List   Diagnosis Date Noted  . Falls 01/27/2018  . H/O toe surgery 06/20/2017  . Acute renal failure superimposed on stage 3 chronic kidney disease ([place]) 06/20/2017  . Hyperkalemia 06/20/2017  . BPH (benign prostatic hyperplasia) 06/18/2017  . History of colon polyps 06/18/2017  . Insomnia 06/18/2017  . Mixed hyperlipidemia 06/18/2017  . Shortness of breath 06/18/2017  . Tinnitus of both ears 06/18/2017  . Tobacco use 06/18/2017  . Vitamin D deficiency 06/18/2017  . AKI (acute kidney injury) ([place]) 06/11/2017  . Acquired hallux rigidus of right foot 04/26/2017  . Morbid obesity ([place]) 03/18/2016  . COPD GOLD II with restrictive component  01/13/2016  . Family history of coronary arteriosclerosis 01/19/2015  . Essential hypertension 01/19/2015  . Bipolar 1 disorder ([place]) 08/11/2014  . GERD (gastroesophageal reflux disease) 08/11/2014  . Hypothyroidism 08/11/2014     Reginald Tucker, Reginald Tucker, Reginald Tucker 03/04/18 11:08 AM   [facility] [address]  [place], [place], [address] Phone: [phone]   Fax:  [phone]  Name: Reginald Tucker MRN: [id number] Date of Birth: 07-14-1956

## 2018-03-04 NOTE — Patient Instructions (Signed)
Sit to Stand / Stand to Sit / Transfers   Sit on edge of a solid chair with arms, feet flat on floor. Lean forward over feet and stand up with hands on chair arms. Sit down slowly with hands on chair arms. Repeat _10-15__ times per session. Do _2-3___ sessions per day.  Mini Squat: Double Leg   With feet shoulder width apart, reach forward for balance and do a mini squat. Keep knees in line with second toe. Knees do not go past toes. Repeat _15__ times per set. Hold onto countertop.   Heel Raise: Bilateral (Standing)   Rise on balls of feet. Repeat __15__ times per set. Hold onto countertop

## 2018-03-11 ENCOUNTER — Ambulatory Visit: Payer: PPO | Attending: Neurology

## 2018-03-11 DIAGNOSIS — R2689 Other abnormalities of gait and mobility: Secondary | ICD-10-CM | POA: Diagnosis not present

## 2018-03-11 DIAGNOSIS — R2681 Unsteadiness on feet: Secondary | ICD-10-CM | POA: Insufficient documentation

## 2018-03-11 DIAGNOSIS — M6281 Muscle weakness (generalized): Secondary | ICD-10-CM | POA: Diagnosis not present

## 2018-03-11 NOTE — Therapy (Signed)
[facility] [address]  [place], [place], [address] Phone: [phone]   Fax:  [phone]  Physical Therapy Treatment  Patient Details  Name: Reginald Tucker MRN: [id number] Date of Birth: 1956/03/16 Referring Provider: Dr. Jaynee Eagles   Encounter Date: 03/11/2018  PT End of Session - 03/11/18 1022    Visit Number  2    Number of Visits  8    Date for PT Re-Evaluation  04/08/18    Authorization Type  [employer]    PT Start Time  1016    PT Stop Time  1101    PT Time Calculation (min)  45 min    Activity Tolerance  Patient tolerated treatment well    Behavior During Therapy  [facility] for tasks assessed/performed       Past Medical History:  Diagnosis Date  . Acquired hallux rigidus of right foot 04/26/2017  . AKI (acute kidney injury) ([place]) 06/11/2017  . Anxiety   . Arthritis   . Bipolar 1 disorder ([place])   . BMI 37.0-37.9, adult   . BPH (benign prostatic hyperplasia)   . Cataract   . Cataract    L eye  . CKD (chronic kidney disease), stage III ([place])   . Colon polyps   . COPD GOLD II with restrictive component  01/13/2016   Spirometry 01/13/2016  FEV1 1.84 (47%)  Ratio 62  - 01/13/2016  extensive coaching HFA effectiveness =    90% > try stiolto respimat 2 pffs each am > did not benefit so stopped when sample out - 01/13/2016  Walked RA x 3 laps @ 185 ft each stopped due to  End of study, nl pace, no desat  / min sob  - PFT's  03/16/2016  FEV1 2.28 (59 % ) ratio 67  p 12 % improvement from saba p no prior to study with DLCO  66 % corrects to 86 % for alv volume     . Depression   . Essential hypertension 01/19/2015  . Family history of coronary arteriosclerosis 01/19/2015   Father with MI   . Fatty liver   . GERD (gastroesophageal reflux disease) 08/11/2014  . Headache    Aura without headache  . History of colon polyps 06/18/2017  . Hyperlipidemia   . Hypertension   . Hypothyroidism 08/11/2014  . Hypothyroidism   . Insomnia  06/18/2017  . Insomnia   . Memory change   . Mixed hyperlipidemia 06/18/2017  . Morbid obesity ([place]) 5/0/2774   Complicated by HBP/ Low erv on pfts 03/16/2016 (31%)    . Morbid obesity due to excess calories ([place])   . Prediabetes   . Shortness of breath 06/18/2017  . SOB (shortness of breath)   . Thyroid disease   . Tinnitus of both ears 06/18/2017  . Tobacco use 06/18/2017  . Unsteadiness on feet   . Vitamin D deficiency 06/18/2017  . Vitamin D deficiency     Past Surgical History:  Procedure Laterality Date  . COLONOS    . COLONSCOPY    . CYST EXCISION  12/24/2016   sebaceous cyst chest wall  Dr. Hulen Skains  . MASS EXCISION Left 12/24/2016   Procedure: EXCISION LEFT CHEST WALL SEBACEOUS CYST;  Surgeon: Judeth Horn, MD;  Location: [facility];  Service: General;  Laterality: Left;  . RADIOLOGY WITH ANESTHESIA N/A 02/11/2018   Procedure: MRI OF BRAIN WITHOUT CONTRAST AND LUMBER SPINE AND CERVICAL;  Surgeon: Radiologist, Medication, MD;  Location: [place];  Service: Radiology;  Laterality: N/A;  . root tip removal    . TOE FUSION Right 2018    There were no vitals filed for this visit.  Subjective Assessment - 03/11/18 1103    Subjective  Pt. denies falls since last visit.      Pertinent History  bipolar, CKD stage III, depression, HTN    Diagnostic tests  MRI of brain, C-spine, and L-spine: largely unremarkable, some facet arthritis of C-spine    Patient Stated Goals  improve balance and strength    Currently in Pain?  No/denies    Pain Score  0-No pain    Multiple Pain Sites  No                       [facility] Adult PT Treatment/Exercise - 03/11/18 1212      Knee/Hip Exercises: Aerobic   Nustep  Lvl 3, 7 min           Balance Exercises - 03/11/18 1212      OTAGO PROGRAM   Head Movements  Standing;5 reps    Neck Movements  Standing;5 reps    Back Extension  Standing;5 reps    Trunk Movements  Standing;5 reps    Ankle Movements  Sitting;10 reps     Knee Extensor  10 reps    Knee Flexor  10 reps    Hip ABductor  10 reps    Ankle Plantorflexors  20 reps, no support    Ankle Dorsiflexors  20 reps, support support due to instability     Knee Bends  10 reps, no support    Backwards Walking  No support    Walking and Turning Around  No assistive device    Sideways Walking  No assistive device    Tandem Stance  10 seconds, no support intermittent UE support on counter     Tandem Walk  No support intermittent UE support on counter     One Leg Stand  10 seconds, support intermittent UE support on counter     Heel Walking  Support    Toe Walk  No support    Heel Toe Walking Backward  -- intermittent support on counter     Sit to Stand  10 reps, no support    Stair Walking  -- Not addressed as pt. navigated stairs on eval.      Overall OTAGO Comments  Encouraged pt. to try all activities in [place] and can add ankle weights at a later time         PT Education - 03/11/18 1218    Education provided  Yes    Education Details  OTAGO handout: issued to pt. with activities at support level listed in flowsheet and highlighted    Person(s) Educated  Patient    Methods  Explanation;Demonstration;Verbal cues;Handout    Comprehension  Verbalized understanding;Returned demonstration;Verbal cues required;Need further instruction          PT Long Term Goals - 03/11/18 1023      PT LONG TERM GOAL #1   Title  patient to be independent with advanced HEP for balance and strengthening    Status  On-going      PT LONG TERM GOAL #2   Title  patient to improve Berg Balance to >/= 54/56 demonstrating improved balance    Status  On-going      PT LONG TERM GOAL #3   Title  patient to improve  DGI to >/= 22 demonstrating improved balance    Status  On-going      PT LONG TERM GOAL #4   Title  patient to improve B LE strength to >/= 4+/5 for improved function    Status  On-going      PT LONG TERM GOAL #5   Title  patient to report no LOB or falls  for >/= 1 month    Status  On-going            Plan - 03/11/18 1216    Clinical Impression Statement  Jeneen Rinks reporting no issues with previously issued HEP.  Reviewed OTAGO balance program with pt. today with pt. able to perform most activities without UE support (see flowsheet).  Most difficulty with tandem walk, tandem walk activities today however able to safely perform these activities in treatment with intermittent counter support.  Pt. verbalized understanding of OTAGO program to end treatment.  Will monitor response to HEP adherence and continue to progress toward goals.       PT Treatment/Interventions  ADLs/Self Care Home Management;Cryotherapy;Electrical Stimulation;Moist Heat;Therapeutic exercise;Therapeutic activities;Functional mobility training;Stair training;Gait training;Balance training;Neuromuscular re-education;Patient/family education;Manual techniques;Vasopneumatic Device;Taping;Dry needling;Passive range of motion    Consulted and Agree with Plan of Care  Patient       Patient will benefit from skilled therapeutic intervention in order to improve the following deficits and impairments:  Decreased strength, Decreased balance, Decreased mobility  Visit Diagnosis: Unsteadiness on feet  Other abnormalities of gait and mobility  Muscle weakness (generalized)     Problem List Patient Active Problem List   Diagnosis Date Noted  . Falls 01/27/2018  . H/O toe surgery 06/20/2017  . Acute renal failure superimposed on stage 3 chronic kidney disease ([place]) 06/20/2017  . Hyperkalemia 06/20/2017  . BPH (benign prostatic hyperplasia) 06/18/2017  . History of colon polyps 06/18/2017  . Insomnia 06/18/2017  . Mixed hyperlipidemia 06/18/2017  . Shortness of breath 06/18/2017  . Tinnitus of both ears 06/18/2017  . Tobacco use 06/18/2017  . Vitamin D deficiency 06/18/2017  . AKI (acute kidney injury) ([place]) 06/11/2017  . Acquired hallux rigidus of right foot 04/26/2017  .  Morbid obesity ([place]) 03/18/2016  . COPD GOLD II with restrictive component  01/13/2016  . Family history of coronary arteriosclerosis 01/19/2015  . Essential hypertension 01/19/2015  . Bipolar 1 disorder ([place]) 08/11/2014  . GERD (gastroesophageal reflux disease) 08/11/2014  . Hypothyroidism 08/11/2014    Bess Harvest, PTA 03/11/18 12:25 PM  [facility] [address]  [place], [place], [address] Phone: [phone]   Fax:  [phone]  Name: DREXEL IVEY MRN: [id number] Date of Birth: 06-Oct-1956

## 2018-03-14 ENCOUNTER — Ambulatory Visit: Payer: PPO

## 2018-03-14 DIAGNOSIS — R2681 Unsteadiness on feet: Secondary | ICD-10-CM | POA: Diagnosis not present

## 2018-03-14 DIAGNOSIS — M6281 Muscle weakness (generalized): Secondary | ICD-10-CM

## 2018-03-14 DIAGNOSIS — R2689 Other abnormalities of gait and mobility: Secondary | ICD-10-CM

## 2018-03-14 NOTE — Therapy (Signed)
[facility] [address]  [place], [place], [address] Phone: [phone]   Fax:  [phone]  Physical Therapy Treatment  Patient Details  Name: Reginald Tucker MRN: [id number] Date of Birth: May 31, 1956 Referring Provider: Dr. Jaynee Eagles   Encounter Date: 03/14/2018  PT End of Session - 03/14/18 1040    Visit Number  3    Number of Visits  8    Date for PT Re-Evaluation  04/08/18    Authorization Type  [employer]    PT Start Time  1021    PT Stop Time  1100    PT Time Calculation (min)  39 min    Activity Tolerance  Patient tolerated treatment well    Behavior During Therapy  [facility] for tasks assessed/performed       Past Medical History:  Diagnosis Date  . Acquired hallux rigidus of right foot 04/26/2017  . AKI (acute kidney injury) ([place]) 06/11/2017  . Anxiety   . Arthritis   . Bipolar 1 disorder ([place])   . BMI 37.0-37.9, adult   . BPH (benign prostatic hyperplasia)   . Cataract   . Cataract    L eye  . CKD (chronic kidney disease), stage III ([place])   . Colon polyps   . COPD GOLD II with restrictive component  01/13/2016   Spirometry 01/13/2016  FEV1 1.84 (47%)  Ratio 62  - 01/13/2016  extensive coaching HFA effectiveness =    90% > try stiolto respimat 2 pffs each am > did not benefit so stopped when sample out - 01/13/2016  Walked RA x 3 laps @ 185 ft each stopped due to  End of study, nl pace, no desat  / min sob  - PFT's  03/16/2016  FEV1 2.28 (59 % ) ratio 67  p 12 % improvement from saba p no prior to study with DLCO  66 % corrects to 86 % for alv volume     . Depression   . Essential hypertension 01/19/2015  . Family history of coronary arteriosclerosis 01/19/2015   Father with MI   . Fatty liver   . GERD (gastroesophageal reflux disease) 08/11/2014  . Headache    Aura without headache  . History of colon polyps 06/18/2017  . Hyperlipidemia   . Hypertension   . Hypothyroidism 08/11/2014  . Hypothyroidism   . Insomnia  06/18/2017  . Insomnia   . Memory change   . Mixed hyperlipidemia 06/18/2017  . Morbid obesity ([place]) 01/13/2682   Complicated by HBP/ Low erv on pfts 03/16/2016 (31%)    . Morbid obesity due to excess calories ([place])   . Prediabetes   . Shortness of breath 06/18/2017  . SOB (shortness of breath)   . Thyroid disease   . Tinnitus of both ears 06/18/2017  . Tobacco use 06/18/2017  . Unsteadiness on feet   . Vitamin D deficiency 06/18/2017  . Vitamin D deficiency     Past Surgical History:  Procedure Laterality Date  . COLONOS    . COLONSCOPY    . CYST EXCISION  12/24/2016   sebaceous cyst chest wall  Dr. Hulen Skains  . MASS EXCISION Left 12/24/2016   Procedure: EXCISION LEFT CHEST WALL SEBACEOUS CYST;  Surgeon: Judeth Horn, MD;  Location: [place];  Service: General;  Laterality: Left;  . RADIOLOGY WITH ANESTHESIA N/A 02/11/2018   Procedure: MRI OF BRAIN WITHOUT CONTRAST AND LUMBER SPINE AND CERVICAL;  Surgeon: Radiologist, Medication, MD;  Location: [facility];  Service: Radiology;  Laterality: N/A;  . root tip removal    . TOE FUSION Right 2018    There were no vitals filed for this visit.  Subjective Assessment - 03/14/18 1041    Subjective  reports consistent adherence to OTAGO.      Pertinent History  bipolar, CKD stage III, depression, HTN    Diagnostic tests  MRI of brain, C-spine, and L-spine: largely unremarkable, some facet arthritis of C-spine    Patient Stated Goals  improve balance and strength    Currently in Pain?  No/denies    Pain Score  0-No pain    Multiple Pain Sites  No                       [facility] Adult PT Treatment/Exercise - 03/14/18 1039      Neuro Re-ed    Neuro Re-ed Details   backwards tandem walk x 3 laps at counter; cues for upright posture      Exercises   Exercises  Knee/Hip      Knee/Hip Exercises: Stretches   Gastroc Stretch  Right;Left;1 rep;20 seconds    Gastroc Stretch Limitations  into wall      Knee/Hip Exercises:  Aerobic   Recumbent Bike  Lvl 2, 6 min       Knee/Hip Exercises: Machines for Strengthening   Cybex Knee Flexion  B LE's 20# x 10 reps       Knee/Hip Exercises: Standing   Forward Step Up  Right;Left;Step Height: 4";10 reps;Hand Hold: 1    Forward Step Up Limitations  with green TB TKE     Functional Squat  10 reps;3 seconds    Other Standing Knee Exercises  Side stepping with yellow TB 2 x 25 ft  Cues for technique       Knee/Hip Exercises: Seated   Long Arc Quad  Right;Left;15 reps;Weights    Long Arc Quad Weight  1 lbs.    Long Arc Quad Limitations  adduction ball squeeze    Hamstring Curl  Right;Left;10 reps    Hamstring Limitations  green TB    Sit to Sand  10 reps;2 sets;without UE support from chair without armress; cues for pacing                   PT Long Term Goals - 03/11/18 1023      PT LONG TERM GOAL #1   Title  patient to be independent with advanced HEP for balance and strengthening    Status  On-going      PT LONG TERM GOAL #2   Title  patient to improve Berg Balance to >/= 54/56 demonstrating improved balance    Status  On-going      PT LONG TERM GOAL #3   Title  patient to improve DGI to >/= 22 demonstrating improved balance    Status  On-going      PT LONG TERM GOAL #4   Title  patient to improve B LE strength to >/= 4+/5 for improved function    Status  On-going      PT LONG TERM GOAL #5   Title  patient to report no LOB or falls for >/= 1 month    Status  On-going            Plan - 03/14/18 1241    Clinical Impression Statement  Reginald Tucker reporting he is performing OTAGO 1-2x/day.  Did report some "  nagging" L knee pain intermittently over last few day thus encouraged to perform OTAGO only 3x/wk (as instructed in handout) and allow knee time to recover.  Tolerated all LE strengthening activities well today.  Requires cues to slow movements as pt. with tendency to rush through activities.  Will continue to progress toward goals.      PT  Treatment/Interventions  ADLs/Self Care Home Management;Cryotherapy;Electrical Stimulation;Moist Heat;Therapeutic exercise;Therapeutic activities;Functional mobility training;Stair training;Gait training;Balance training;Neuromuscular re-education;Patient/family education;Manual techniques;Vasopneumatic Device;Taping;Dry needling;Passive range of motion    Consulted and Agree with Plan of Care  Patient       Patient will benefit from skilled therapeutic intervention in order to improve the following deficits and impairments:  Decreased strength, Decreased balance, Decreased mobility  Visit Diagnosis: Unsteadiness on feet  Other abnormalities of gait and mobility  Muscle weakness (generalized)     Problem List Patient Active Problem List   Diagnosis Date Noted  . Falls 01/27/2018  . H/O toe surgery 06/20/2017  . Acute renal failure superimposed on stage 3 chronic kidney disease ([place]) 06/20/2017  . Hyperkalemia 06/20/2017  . BPH (benign prostatic hyperplasia) 06/18/2017  . History of colon polyps 06/18/2017  . Insomnia 06/18/2017  . Mixed hyperlipidemia 06/18/2017  . Shortness of breath 06/18/2017  . Tinnitus of both ears 06/18/2017  . Tobacco use 06/18/2017  . Vitamin D deficiency 06/18/2017  . AKI (acute kidney injury) ([place]) 06/11/2017  . Acquired hallux rigidus of right foot 04/26/2017  . Morbid obesity ([place]) 03/18/2016  . COPD GOLD II with restrictive component  01/13/2016  . Family history of coronary arteriosclerosis 01/19/2015  . Essential hypertension 01/19/2015  . Bipolar 1 disorder ([place]) 08/11/2014  . GERD (gastroesophageal reflux disease) 08/11/2014  . Hypothyroidism 08/11/2014    Reginald Tucker, Reginald Tucker 03/14/18 12:55 PM  [facility] [address]  [place], [place], [address] Phone: [phone]   Fax:  [phone]  Name: Reginald Tucker MRN: [id number] Date of Birth: 04-01-1956

## 2018-03-18 ENCOUNTER — Ambulatory Visit: Payer: PPO | Admitting: Physical Therapy

## 2018-03-21 ENCOUNTER — Ambulatory Visit: Payer: PPO

## 2018-03-21 DIAGNOSIS — R2681 Unsteadiness on feet: Secondary | ICD-10-CM | POA: Diagnosis not present

## 2018-03-21 DIAGNOSIS — M6281 Muscle weakness (generalized): Secondary | ICD-10-CM

## 2018-03-21 DIAGNOSIS — R2689 Other abnormalities of gait and mobility: Secondary | ICD-10-CM

## 2018-03-21 NOTE — Therapy (Signed)
[facility] [address]  [place], [place], [address] Phone: [phone]   Fax:  [phone]  Physical Therapy Treatment  Patient Details  Name: Reginald Tucker MRN: [id number] Date of Birth: 08-12-1956 Referring Provider: Dr. Jaynee Eagles   Encounter Date: 03/21/2018  PT End of Session - 03/21/18 1019    Visit Number  4    Number of Visits  8    Date for PT Re-Evaluation  04/08/18    Authorization Type  [employer]    PT Start Time  1015    PT Stop Time  1100    PT Time Calculation (min)  45 min    Activity Tolerance  Patient tolerated treatment well    Behavior During Therapy  [facility] for tasks assessed/performed       Past Medical History:  Diagnosis Date  . Acquired hallux rigidus of right foot 04/26/2017  . AKI (acute kidney injury) ([place]) 06/11/2017  . Anxiety   . Arthritis   . Bipolar 1 disorder ([place])   . BMI 37.0-37.9, adult   . BPH (benign prostatic hyperplasia)   . Cataract   . Cataract    L eye  . CKD (chronic kidney disease), stage III ([place])   . Colon polyps   . COPD GOLD II with restrictive component  01/13/2016   Spirometry 01/13/2016  FEV1 1.84 (47%)  Ratio 62  - 01/13/2016  extensive coaching HFA effectiveness =    90% > try stiolto respimat 2 pffs each am > did not benefit so stopped when sample out - 01/13/2016  Walked RA x 3 laps @ 185 ft each stopped due to  End of study, nl pace, no desat  / min sob  - PFT's  03/16/2016  FEV1 2.28 (59 % ) ratio 67  p 12 % improvement from saba p no prior to study with DLCO  66 % corrects to 86 % for alv volume     . Depression   . Essential hypertension 01/19/2015  . Family history of coronary arteriosclerosis 01/19/2015   Father with MI   . Fatty liver   . GERD (gastroesophageal reflux disease) 08/11/2014  . Headache    Aura without headache  . History of colon polyps 06/18/2017  . Hyperlipidemia   . Hypertension   . Hypothyroidism 08/11/2014  . Hypothyroidism   . Insomnia  06/18/2017  . Insomnia   . Memory change   . Mixed hyperlipidemia 06/18/2017  . Morbid obesity ([place]) 05/18/6294   Complicated by HBP/ Low erv on pfts 03/16/2016 (31%)    . Morbid obesity due to excess calories ([place])   . Prediabetes   . Shortness of breath 06/18/2017  . SOB (shortness of breath)   . Thyroid disease   . Tinnitus of both ears 06/18/2017  . Tobacco use 06/18/2017  . Unsteadiness on feet   . Vitamin D deficiency 06/18/2017  . Vitamin D deficiency     Past Surgical History:  Procedure Laterality Date  . COLONOS    . COLONSCOPY    . CYST EXCISION  12/24/2016   sebaceous cyst chest wall  Dr. Hulen Skains  . MASS EXCISION Left 12/24/2016   Procedure: EXCISION LEFT CHEST WALL SEBACEOUS CYST;  Surgeon: Judeth Horn, MD;  Location: [facility];  Service: General;  Laterality: Left;  . RADIOLOGY WITH ANESTHESIA N/A 02/11/2018   Procedure: MRI OF BRAIN WITHOUT CONTRAST AND LUMBER SPINE AND CERVICAL;  Surgeon: Radiologist, Medication, MD;  Location: [facility];  Service: Radiology;  Laterality: N/A;  . root tip removal    . TOE FUSION Right 2018    There were no vitals filed for this visit.  Subjective Assessment - 03/21/18 1030    Subjective  Pt. reporting no longer having knee pain.  Cut back HEP performance to 1x/day.    Pertinent History  bipolar, CKD stage III, depression, HTN    Diagnostic tests  MRI of brain, C-spine, and L-spine: largely unremarkable, some facet arthritis of C-spine    Patient Stated Goals  improve balance and strength    Currently in Pain?  No/denies    Pain Score  0-No pain    Multiple Pain Sites  No                       [facility] Adult PT Treatment/Exercise - 03/21/18 1037      High Level Balance   High Level Balance Activities  Tandem walking;Backward walking;Marching backwards;Sudden stops;Turns + changes in gait speed fast <> slow x 20 ft trials       Neuro Re-ed    Neuro Re-ed Details   Side-stepping on blue foam balance beam with  red looped TB at ankles x 5 laps; Corner balance on airex pad: Narrow stance in rhomberg position on airex: horizontal head turns x 30 sec, vertical head turns x 30 sec; B diagonal head turns x 30 sec       Knee/Hip Exercises: Aerobic   Recumbent Bike  Lvl 2, 6 min       Knee/Hip Exercises: Standing   SLS  B SLS on foam 3 x 10 reps each way  intermittent chair support     Other Standing Knee Exercises  Alternating cone nock over / righting 4 x 6 cones  some difficulty and supervision provided                   PT Long Term Goals - 03/11/18 1023      PT LONG TERM GOAL #1   Title  patient to be independent with advanced HEP for balance and strengthening    Status  On-going      PT LONG TERM GOAL #2   Title  patient to improve Berg Balance to >/= 54/56 demonstrating improved balance    Status  On-going      PT LONG TERM GOAL #3   Title  patient to improve DGI to >/= 22 demonstrating improved balance    Status  On-going      PT LONG TERM GOAL #4   Title  patient to improve B LE strength to >/= 4+/5 for improved function    Status  On-going      PT LONG TERM GOAL #5   Title  patient to report no LOB or falls for >/= 1 month    Status  On-going            Plan - 03/21/18 1019    Clinical Impression Statement  Pt. reporting no longer having R knee pain after cutting HEP performance back to 1x/day.  Pt. performed well with all dynamic and static balance activities on compliant surface today.  Most difficulty with tandem gait activities however tolerated addition of cone nock over/right and corner balance activities well today.  Requires cueing with most activities in treatment for pacing as pt. tends to "rush" through activities.  Will continue to progress toward goals.      PT  Treatment/Interventions  ADLs/Self Care Home Management;Cryotherapy;Electrical Stimulation;Moist Heat;Therapeutic exercise;Therapeutic activities;Functional mobility training;Stair training;Gait  training;Balance training;Neuromuscular re-education;Patient/family education;Manual techniques;Vasopneumatic Device;Taping;Dry needling;Passive range of motion    Consulted and Agree with Plan of Care  Patient       Patient will benefit from skilled therapeutic intervention in order to improve the following deficits and impairments:  Decreased strength, Decreased balance, Decreased mobility  Visit Diagnosis: Unsteadiness on feet  Other abnormalities of gait and mobility  Muscle weakness (generalized)     Problem List Patient Active Problem List   Diagnosis Date Noted  . Falls 01/27/2018  . H/O toe surgery 06/20/2017  . Acute renal failure superimposed on stage 3 chronic kidney disease ([place]) 06/20/2017  . Hyperkalemia 06/20/2017  . BPH (benign prostatic hyperplasia) 06/18/2017  . History of colon polyps 06/18/2017  . Insomnia 06/18/2017  . Mixed hyperlipidemia 06/18/2017  . Shortness of breath 06/18/2017  . Tinnitus of both ears 06/18/2017  . Tobacco use 06/18/2017  . Vitamin D deficiency 06/18/2017  . AKI (acute kidney injury) ([place]) 06/11/2017  . Acquired hallux rigidus of right foot 04/26/2017  . Morbid obesity ([place]) 03/18/2016  . COPD GOLD II with restrictive component  01/13/2016  . Family history of coronary arteriosclerosis 01/19/2015  . Essential hypertension 01/19/2015  . Bipolar 1 disorder ([place]) 08/11/2014  . GERD (gastroesophageal reflux disease) 08/11/2014  . Hypothyroidism 08/11/2014    Bess Harvest, PTA 03/21/18 12:14 PM  [facility] [address]  [place], [place], [address] Phone: [phone]   Fax:  [phone]  Name: Reginald Tucker MRN: [id number] Date of Birth: [birth year]

## 2018-03-25 ENCOUNTER — Ambulatory Visit: Payer: PPO

## 2018-03-25 DIAGNOSIS — R2681 Unsteadiness on feet: Secondary | ICD-10-CM

## 2018-03-25 DIAGNOSIS — M6281 Muscle weakness (generalized): Secondary | ICD-10-CM

## 2018-03-25 DIAGNOSIS — R2689 Other abnormalities of gait and mobility: Secondary | ICD-10-CM

## 2018-03-25 NOTE — Therapy (Addendum)
[facility] [address]  [place], [place], [address] Phone: [phone]   Fax:  [phone]  Physical Therapy Treatment  Patient Details  Name: Reginald Tucker MRN: [id number] Date of Birth: [birth year] Referring Provider: Dr. Jaynee Eagles   Encounter Date: 03/25/2018  PT End of Session - 03/25/18 1021    Visit Number  5    Number of Visits  8    Date for PT Re-Evaluation  04/08/18    Authorization Type  [employer]    PT Start Time  1016    PT Stop Time  1058    PT Time Calculation (min)  42 min    Activity Tolerance  Patient tolerated treatment well    Behavior During Therapy  [facility] for tasks assessed/performed       Past Medical History:  Diagnosis Date  . Acquired hallux rigidus of right foot 04/26/2017  . AKI (acute kidney injury) ([place]) 06/11/2017  . Anxiety   . Arthritis   . Bipolar 1 disorder ([place])   . BMI 37.0-37.9, adult   . BPH (benign prostatic hyperplasia)   . Cataract   . Cataract    L eye  . CKD (chronic kidney disease), stage III ([place])   . Colon polyps   . COPD GOLD II with restrictive component  01/13/2016   Spirometry 01/13/2016  FEV1 1.84 (47%)  Ratio 62  - 01/13/2016  extensive coaching HFA effectiveness =    90% > try stiolto respimat 2 pffs each am > did not benefit so stopped when sample out - 01/13/2016  Walked RA x 3 laps @ 185 ft each stopped due to  End of study, nl pace, no desat  / min sob  - PFT's  03/16/2016  FEV1 2.28 (59 % ) ratio 67  p 12 % improvement from saba p no prior to study with DLCO  66 % corrects to 86 % for alv volume     . Depression   . Essential hypertension 01/19/2015  . Family history of coronary arteriosclerosis 01/19/2015   Father with MI   . Fatty liver   . GERD (gastroesophageal reflux disease) 08/11/2014  . Headache    Aura without headache  . History of colon polyps 06/18/2017  . Hyperlipidemia   . Hypertension   . Hypothyroidism 08/11/2014  . Hypothyroidism   . Insomnia  06/18/2017  . Insomnia   . Memory change   . Mixed hyperlipidemia 06/18/2017  . Morbid obesity ([place]) 06/13/6432   Complicated by HBP/ Low erv on pfts 03/16/2016 (31%)    . Morbid obesity due to excess calories ([place])   . Prediabetes   . Shortness of breath 06/18/2017  . SOB (shortness of breath)   . Thyroid disease   . Tinnitus of both ears 06/18/2017  . Tobacco use 06/18/2017  . Unsteadiness on feet   . Vitamin D deficiency 06/18/2017  . Vitamin D deficiency     Past Surgical History:  Procedure Laterality Date  . COLONOS    . COLONSCOPY    . CYST EXCISION  12/24/2016   sebaceous cyst chest wall  Dr. Hulen Skains  . MASS EXCISION Left 12/24/2016   Procedure: EXCISION LEFT CHEST WALL SEBACEOUS CYST;  Surgeon: Judeth Horn, MD;  Location: [facility];  Service: General;  Laterality: Left;  . RADIOLOGY WITH ANESTHESIA N/A 02/11/2018   Procedure: MRI OF BRAIN WITHOUT CONTRAST AND LUMBER SPINE AND CERVICAL;  Surgeon: Radiologist, Medication, MD;  Location: [facility];  Service: Radiology;  Laterality: N/A;  . root tip removal    . TOE FUSION Right 2018    There were no vitals filed for this visit.  Subjective Assessment - 03/25/18 1019    Subjective  Pt. reporting he has lost ~ three pounds since last visit which he attributes to his diet.      Pertinent History  bipolar, CKD stage III, depression, HTN    Diagnostic tests  MRI of brain, C-spine, and L-spine: largely unremarkable, some facet arthritis of C-spine    Patient Stated Goals  improve balance and strength    Currently in Pain?  No/denies    Pain Score  0-No pain    Multiple Pain Sites  No                       [facility] Adult PT Treatment/Exercise - 03/25/18 1049      High Level Balance   High Level Balance Activities  Backward walking;Tandem walking tandem walk forw with head turns, backwards tandem walk       Knee/Hip Exercises: Aerobic   Recumbent Bike  Lvl 2, 7 min       Knee/Hip Exercises: Standing   Hip  Flexion  Right;Left;10 reps;Knee straight    Hip Flexion Limitations  yellow TB at ankle; 2 ski poles     Hip ADduction  Right;Left;10 reps;Strengthening    Hip ADduction Limitations  yellow TB at ankle; 2 ski poles     Hip Abduction  Right;Left;10 reps;Knee straight    Abduction Limitations  yellow TB at ankle; 2 ski poles     Hip Extension  Right;Left;10 reps;Stengthening;Knee straight    Extension Limitations  yellow TB at ankle; 2 ski poles     Wall Squat  10 reps;3 seconds             PT Education - 03/25/18 1301    Education provided  Yes    Education Details  wall squat     Person(s) Educated  Patient    Methods  Explanation;Demonstration;Verbal cues;Handout    Comprehension  Verbalized understanding;Returned demonstration;Verbal cues required;Need further instruction          PT Long Term Goals - 03/11/18 1023      PT LONG TERM GOAL #1   Title  patient to be independent with advanced HEP for balance and strengthening    Status  On-going      PT LONG TERM GOAL #2   Title  patient to improve Berg Balance to >/= 54/56 demonstrating improved balance    Status  On-going      PT LONG TERM GOAL #3   Title  patient to improve DGI to >/= 22 demonstrating improved balance    Status  On-going      PT LONG TERM GOAL #4   Title  patient to improve B LE strength to >/= 4+/5 for improved function    Status  On-going      PT LONG TERM GOAL #5   Title  patient to report no LOB or falls for >/= 1 month    Status  On-going            Plan - 03/25/18 1021    Clinical Impression Statement  Pt. reporting he felt good following last visit.  Reports he feels his "legs area getting stronger".  Able to perform high-level dynamic gait activities today with narrow BOS well.  able to self-recover  minor LOB independently.  Most difficulty with backwards tandem walk today.  Progressed to wall squat and 4-way hip kicker without issue today.  Pt. does require frequent cueing for  proper pacing and upright posture with activities in treatment.  Seems to be doing well with all balance and strengthening activities.  Pt. questioning therapist and PT regarding when he can return to driving.  Supervising PT encouraging pt. to reach out to MD regarding when appropriate to return to driving.  Progressing well.      PT Treatment/Interventions  ADLs/Self Care Home Management;Cryotherapy;Electrical Stimulation;Moist Heat;Therapeutic exercise;Therapeutic activities;Functional mobility training;Stair training;Gait training;Balance training;Neuromuscular re-education;Patient/family education;Manual techniques;Vasopneumatic Device;Taping;Dry needling;Passive range of motion    Consulted and Agree with Plan of Care  Patient       Patient will benefit from skilled therapeutic intervention in order to improve the following deficits and impairments:  Decreased strength, Decreased balance, Decreased mobility  Visit Diagnosis: Unsteadiness on feet  Other abnormalities of gait and mobility  Muscle weakness (generalized)     Problem List Patient Active Problem List   Diagnosis Date Noted  . Falls 01/27/2018  . H/O toe surgery 06/20/2017  . Acute renal failure superimposed on stage 3 chronic kidney disease ([place]) 06/20/2017  . Hyperkalemia 06/20/2017  . BPH (benign prostatic hyperplasia) 06/18/2017  . History of colon polyps 06/18/2017  . Insomnia 06/18/2017  . Mixed hyperlipidemia 06/18/2017  . Shortness of breath 06/18/2017  . Tinnitus of both ears 06/18/2017  . Tobacco use 06/18/2017  . Vitamin D deficiency 06/18/2017  . AKI (acute kidney injury) ([place]) 06/11/2017  . Acquired hallux rigidus of right foot 04/26/2017  . Morbid obesity ([place]) 03/18/2016  . COPD GOLD II with restrictive component  01/13/2016  . Family history of coronary arteriosclerosis 01/19/2015  . Essential hypertension 01/19/2015  . Bipolar 1 disorder ([place]) 08/11/2014  . GERD (gastroesophageal reflux  disease) 08/11/2014  . Hypothyroidism 08/11/2014    Bess Harvest, PTA 03/25/18 1:02 PM  [facility] [address]  [place], [place], [address] Phone: [phone]   Fax:  [phone]  Name: Reginald Tucker MRN: [id number] Date of Birth: 1956-03-04

## 2018-03-26 NOTE — Progress Notes (Signed)
I would refer him to psychiatry for this, neuro workup was negative thanks

## 2018-03-27 ENCOUNTER — Ambulatory Visit: Payer: PPO

## 2018-03-31 DIAGNOSIS — F3132 Bipolar disorder, current episode depressed, moderate: Secondary | ICD-10-CM | POA: Diagnosis not present

## 2018-04-01 ENCOUNTER — Ambulatory Visit: Payer: PPO

## 2018-04-01 DIAGNOSIS — M6281 Muscle weakness (generalized): Secondary | ICD-10-CM

## 2018-04-01 DIAGNOSIS — R2681 Unsteadiness on feet: Secondary | ICD-10-CM

## 2018-04-01 DIAGNOSIS — R2689 Other abnormalities of gait and mobility: Secondary | ICD-10-CM

## 2018-04-01 NOTE — Therapy (Signed)
[facility] [address]  [place], [place], [address] Phone: [phone]   Fax:  [phone]  Physical Therapy Treatment  Patient Details  Name: Reginald Tucker MRN: [id number] Date of Birth: 1956/05/27 Referring Provider: Dr. Jaynee Eagles   Encounter Date: 04/01/2018  PT End of Session - 04/01/18 1037    Visit Number  6    Number of Visits  8    Date for PT Re-Evaluation  04/08/18    Authorization Type  [employer]    PT Start Time  1016    PT Stop Time  1100    PT Time Calculation (min)  44 min    Activity Tolerance  Patient tolerated treatment well    Behavior During Therapy  [facility] for tasks assessed/performed       Past Medical History:  Diagnosis Date  . Acquired hallux rigidus of right foot 04/26/2017  . AKI (acute kidney injury) ([place]) 06/11/2017  . Anxiety   . Arthritis   . Bipolar 1 disorder ([place])   . BMI 37.0-37.9, adult   . BPH (benign prostatic hyperplasia)   . Cataract   . Cataract    L eye  . CKD (chronic kidney disease), stage III ([place])   . Colon polyps   . COPD GOLD II with restrictive component  01/13/2016   Spirometry 01/13/2016  FEV1 1.84 (47%)  Ratio 62  - 01/13/2016  extensive coaching HFA effectiveness =    90% > try stiolto respimat 2 pffs each am > did not benefit so stopped when sample out - 01/13/2016  Walked RA x 3 laps @ 185 ft each stopped due to  End of study, nl pace, no desat  / min sob  - PFT's  03/16/2016  FEV1 2.28 (59 % ) ratio 67  p 12 % improvement from saba p no prior to study with DLCO  66 % corrects to 86 % for alv volume     . Depression   . Essential hypertension 01/19/2015  . Family history of coronary arteriosclerosis 01/19/2015   Father with MI   . Fatty liver   . GERD (gastroesophageal reflux disease) 08/11/2014  . Headache    Aura without headache  . History of colon polyps 06/18/2017  . Hyperlipidemia   . Hypertension   . Hypothyroidism 08/11/2014  . Hypothyroidism   . Insomnia  06/18/2017  . Insomnia   . Memory change   . Mixed hyperlipidemia 06/18/2017  . Morbid obesity ([place]) 05/13/3328   Complicated by HBP/ Low erv on pfts 03/16/2016 (31%)    . Morbid obesity due to excess calories ([place])   . Prediabetes   . Shortness of breath 06/18/2017  . SOB (shortness of breath)   . Thyroid disease   . Tinnitus of both ears 06/18/2017  . Tobacco use 06/18/2017  . Unsteadiness on feet   . Vitamin D deficiency 06/18/2017  . Vitamin D deficiency     Past Surgical History:  Procedure Laterality Date  . COLONOS    . COLONSCOPY    . CYST EXCISION  12/24/2016   sebaceous cyst chest wall  Dr. Hulen Skains  . MASS EXCISION Left 12/24/2016   Procedure: EXCISION LEFT CHEST WALL SEBACEOUS CYST;  Surgeon: Judeth Horn, MD;  Location: [facility];  Service: General;  Laterality: Left;  . RADIOLOGY WITH ANESTHESIA N/A 02/11/2018   Procedure: MRI OF BRAIN WITHOUT CONTRAST AND LUMBER SPINE AND CERVICAL;  Surgeon: Radiologist, Medication, MD;  Location: [facility];  Service: Radiology;  Laterality: N/A;  . root tip removal    . TOE FUSION Right 2018    There were no vitals filed for this visit.  Subjective Assessment - 04/01/18 1033    Subjective  Reports he is still gaining energy following recent sickness.      Pertinent History  bipolar, CKD stage III, depression, HTN    Diagnostic tests  MRI of brain, C-spine, and L-spine: largely unremarkable, some facet arthritis of C-spine    Patient Stated Goals  improve balance and strength    Currently in Pain?  No/denies    Pain Score  0-No pain    Multiple Pain Sites  No                       [facility] Adult PT Treatment/Exercise - 04/01/18 1037      Neuro Re-ed    Neuro Re-ed Details   Backwards/forwards tandem walk (forward in rhomberg position) with light UE support on wall with backwards 3 x 20 ft  Pt. requesting to focus on backwards tandem walk      Knee/Hip Exercises: Aerobic   Recumbent Bike  Lvl 3, 6 min        Knee/Hip Exercises: Machines for Strengthening   Cybex Knee Extension  B LE's 20# x 10 reps     Cybex Knee Flexion  B LE's 25# x 15 reps     Cybex Leg Press  B LE's 25# x 15 reps       Knee/Hip Exercises: Standing   Hip Flexion  Right;Left;10 reps;Knee straight    Hip Flexion Limitations  red TB at ankle; 2 ski poles     Hip ADduction  Right;Left;10 reps;Strengthening    Hip ADduction Limitations  red TB at ankle; 2 ski poles     Hip Abduction  Right;Left;10 reps;Knee straight    Abduction Limitations  red TB at ankle; 2 ski poles     Hip Extension  Right;Left;10 reps;Stengthening;Knee straight    Extension Limitations  red TB at ankle; 2 ski poles     Functional Squat  15 reps;3 seconds    Functional Squat Limitations  TRX - chair as target behind pt.     SLS with Vectors  B SLS with opposite LE toe-touch to cones 2 x 15 sec              PT Education - 04/01/18 1136    Education provided  Yes    Education Details  HEP update     Person(s) Educated  Patient    Methods  Explanation;Demonstration;Verbal cues;Handout    Comprehension  Verbalized understanding;Returned demonstration;Verbal cues required;Need further instruction          PT Long Term Goals - 03/11/18 1023      PT LONG TERM GOAL #1   Title  patient to be independent with advanced HEP for balance and strengthening    Status  On-going      PT LONG TERM GOAL #2   Title  patient to improve Berg Balance to >/= 54/56 demonstrating improved balance    Status  On-going      PT LONG TERM GOAL #3   Title  patient to improve DGI to >/= 22 demonstrating improved balance    Status  On-going      PT LONG TERM GOAL #4   Title  patient to improve B LE strength to >/= 4+/5 for improved function  Status  On-going      PT LONG TERM GOAL #5   Title  patient to report no LOB or falls for >/= 1 month    Status  On-going            Plan - 04/01/18 1045    Clinical Impression Statement  Tijuan doing well today  reporting more frequent walks with dogs and contacted his psychologist with clearance to drive again.  Performed well with all balance and strengthening activities today.  Able to progress resistance with LE strengthening without issue today.  Continues to require occasional cueing for pacing with activities in treatment.  Made aware today that he is nearing end of POC and verbalizing that he feels he may be comfortable transitioning to home program upon discussing this with PT.  HEP updated.  Will monitor response to updated HEP at upcoming visit.      PT Treatment/Interventions  ADLs/Self Care Home Management;Cryotherapy;Electrical Stimulation;Moist Heat;Therapeutic exercise;Therapeutic activities;Functional mobility training;Stair training;Gait training;Balance training;Neuromuscular re-education;Patient/family education;Manual techniques;Vasopneumatic Device;Taping;Dry needling;Passive range of motion    Consulted and Agree with Plan of Care  Patient       Patient will benefit from skilled therapeutic intervention in order to improve the following deficits and impairments:  Decreased strength, Decreased balance, Decreased mobility  Visit Diagnosis: Unsteadiness on feet  Other abnormalities of gait and mobility  Muscle weakness (generalized)     Problem List Patient Active Problem List   Diagnosis Date Noted  . Falls 01/27/2018  . H/O toe surgery 06/20/2017  . Acute renal failure superimposed on stage 3 chronic kidney disease ([place]) 06/20/2017  . Hyperkalemia 06/20/2017  . BPH (benign prostatic hyperplasia) 06/18/2017  . History of colon polyps 06/18/2017  . Insomnia 06/18/2017  . Mixed hyperlipidemia 06/18/2017  . Shortness of breath 06/18/2017  . Tinnitus of both ears 06/18/2017  . Tobacco use 06/18/2017  . Vitamin D deficiency 06/18/2017  . AKI (acute kidney injury) ([place]) 06/11/2017  . Acquired hallux rigidus of right foot 04/26/2017  . Morbid obesity ([place]) 03/18/2016  . COPD  GOLD II with restrictive component  01/13/2016  . Family history of coronary arteriosclerosis 01/19/2015  . Essential hypertension 01/19/2015  . Bipolar 1 disorder ([place]) 08/11/2014  . GERD (gastroesophageal reflux disease) 08/11/2014  . Hypothyroidism 08/11/2014   Bess Harvest, PTA 04/01/18 11:46 AM  [facility] [address]  [place], [place], [address] Phone: [phone]   Fax:  [phone]  Name: ELIZABETH HAFF MRN: [id number] Date of Birth: [birth year]

## 2018-04-02 ENCOUNTER — Ambulatory Visit (INDEPENDENT_AMBULATORY_CARE_PROVIDER_SITE_OTHER): Payer: PPO | Admitting: Neurology

## 2018-04-02 ENCOUNTER — Encounter: Payer: Self-pay | Admitting: Neurology

## 2018-04-02 VITALS — BP 98/68 | HR 96 | Ht 72.0 in | Wt 224.0 lb

## 2018-04-02 DIAGNOSIS — R351 Nocturia: Secondary | ICD-10-CM | POA: Diagnosis not present

## 2018-04-02 DIAGNOSIS — R0681 Apnea, not elsewhere classified: Secondary | ICD-10-CM | POA: Diagnosis not present

## 2018-04-02 DIAGNOSIS — R0683 Snoring: Secondary | ICD-10-CM

## 2018-04-02 DIAGNOSIS — E669 Obesity, unspecified: Secondary | ICD-10-CM | POA: Diagnosis not present

## 2018-04-02 DIAGNOSIS — G4719 Other hypersomnia: Secondary | ICD-10-CM | POA: Diagnosis not present

## 2018-04-02 NOTE — Progress Notes (Signed)
Subjective:    Patient ID: Reginald Tucker is a 62 y.o. male.  HPI     Star Age, MD, PhD [facility] [address], [address] [place], [place] [address]  Dear Berta Minor,   I saw your patient, Reginald Tucker, upon your kind request in my clinic today for initial consultation of his sleep disorder, in particular, concern for underlying obstructive sleep apnea. The patient is accompanied by his wife today. As you know, Reginald Tucker is a 62 year old right-handed gentleman with an underlying medical history of hypertension, bipolar disease, reflux disease, chronic kidney disease, arthritis, anxiety, COPD, BPH, balance problems, hypothyroidism, hyperlipidemia, vitamin D deficiency and obesity, who reports snoring and excessive daytime somnolence as well as witnessed apneic breathing pauses per wife's report. I reviewed your office note from 02/20/2018. His Epworth sleepiness score is 4 out of 24, fatigue score is 21 out of 63. He previously had a sleep study which was attempted but not completed as I understand. He is married and lives with his wife, he is retired. They have no children, 2 piets in the Household but not in the bedroom environment. He does not watch TV in bed. He does sleep with 4 pillows for support at night also for his leg support. He likes to sleep with a fan on. Bedtime is around 10, rise time around 7. He has had physical therapy recently which strengthen his muscles and also improved his walking and balance he feels. He is less sleepy as he has been more active. He does like to nap in the afternoon around 2 PM for about an hour. He does not always sleep without full hour. He has no family history of sleep apnea. He has had significant nocturia, 3-4 times per average night, denies morning headaches. He has lost significant amount of weight since January in the realm of 40 pounds. He quit smoking in 2018. She drinks caffeine in the form of coffee, 2 cups  per day on average, alcohol none currently.   His Past Medical History Is Significant For: Past Medical History:  Diagnosis Date  . Acquired hallux rigidus of right foot 04/26/2017  . AKI (acute kidney injury) ([place]) 06/11/2017  . Anxiety   . Arthritis   . Bipolar 1 disorder ([place])   . BMI 37.0-37.9, adult   . BPH (benign prostatic hyperplasia)   . Cataract   . Cataract    L eye  . CKD (chronic kidney disease), stage III ([place])   . Colon polyps   . COPD GOLD II with restrictive component  01/13/2016   Spirometry 01/13/2016  FEV1 1.84 (47%)  Ratio 62  - 01/13/2016  extensive coaching HFA effectiveness =    90% > try stiolto respimat 2 pffs each am > did not benefit so stopped when sample out - 01/13/2016  Walked RA x 3 laps @ 185 ft each stopped due to  End of study, nl pace, no desat  / min sob  - PFT's  03/16/2016  FEV1 2.28 (59 % ) ratio 67  p 12 % improvement from saba p no prior to study with DLCO  66 % corrects to 86 % for alv volume     . Depression   . Essential hypertension 01/19/2015  . Family history of coronary arteriosclerosis 01/19/2015   Father with MI   . Fatty liver   . GERD (gastroesophageal reflux disease) 08/11/2014  . Headache    Aura without headache  . History of  colon polyps 06/18/2017  . Hyperlipidemia   . Hypertension   . Hypothyroidism 08/11/2014  . Hypothyroidism   . Insomnia 06/18/2017  . Insomnia   . Memory change   . Mixed hyperlipidemia 06/18/2017  . Morbid obesity ([place]) 06/14/1606   Complicated by HBP/ Low erv on pfts 03/16/2016 (31%)    . Morbid obesity due to excess calories ([place])   . Prediabetes   . Shortness of breath 06/18/2017  . SOB (shortness of breath)   . Thyroid disease   . Tinnitus of both ears 06/18/2017  . Tobacco use 06/18/2017  . Unsteadiness on feet   . Vitamin D deficiency 06/18/2017  . Vitamin D deficiency     His Past Surgical History Is Significant For: Past Surgical History:  Procedure Laterality Date  . COLONOS    . COLONSCOPY    . CYST  EXCISION  12/24/2016   sebaceous cyst chest wall  Dr. Hulen Skains  . MASS EXCISION Left 12/24/2016   Procedure: EXCISION LEFT CHEST WALL SEBACEOUS CYST;  Surgeon: Judeth Horn, MD;  Location: [facility];  Service: General;  Laterality: Left;  . RADIOLOGY WITH ANESTHESIA N/A 02/11/2018   Procedure: MRI OF BRAIN WITHOUT CONTRAST AND LUMBER SPINE AND CERVICAL;  Surgeon: Radiologist, Medication, MD;  Location: [facility];  Service: Radiology;  Laterality: N/A;  . root tip removal    . TOE FUSION Right 2018    His Family History Is Significant For: Family History  Problem Relation Age of Onset  . Cancer Mother   . Hyperlipidemia Father   . Hypertension Father   . CAD Father   . Stroke Father   . Aortic aneurysm Father   . Prostate cancer Father   . Hyperlipidemia Brother   . Appendicitis Maternal Grandfather     His Social History Is Significant For: Social History   Socioeconomic History  . Marital status: Married    Spouse name: Not on file  . Number of children: Not on file  . Years of education: Not on file  . Highest education level: Associate degree: occupational, Hotel manager, or vocational program  Occupational History  . Occupation: act.  assist  Social Needs  . Financial resource strain: Not on file  . Food insecurity:    Worry: Not on file    Inability: Not on file  . Transportation needs:    Medical: Not on file    Non-medical: Not on file  Tobacco Use  . Smoking status: Former Smoker    Packs/day: 1.00    Years: 25.00    Pack years: 25.00    Types: Cigarettes    Last attempt to quit: 12/10/2000    Years since quitting: 17.3  . Smokeless tobacco: Never Used  . Tobacco comment: heavy vape user- quit vaping 06/04/2017  Substance and Sexual Activity  . Alcohol use: No    Alcohol/week: 0.0 oz  . Drug use: No  . Sexual activity: Not on file  Lifestyle  . Physical activity:    Days per week: Not on file    Minutes per session: Not on file  . Stress: Not on  file  Relationships  . Social connections:    Talks on phone: Not on file    Gets together: Not on file    Attends religious service: Not on file    Active member of club or organization: Not on file    Attends meetings of clubs or organizations: Not on file    Relationship status: Not  on file  Other Topics Concern  . Not on file  Social History Narrative   Admitted to [facility] 06/14/17- discharged   Lives at home with his wife   Married - [name]   Former smoker - stopped 2002   Alcohol none   Full code   Right handed   Drinks 2 cups of caffeine daily    His Allergies Are:  Allergies  Allergen Reactions  . Sulfa Antibiotics     UNSPECIFIED REACTION OF CHILDHOOD  . Sulfamethoxazole     UNSPECIFIED REACTION OF CHILDHOOD  . Ambien [Zolpidem Tartrate] Anxiety       :   His Current Medications Are:  Outpatient Encounter Medications as of 04/02/2018  Medication Sig  . Acetylcysteine (N-ACETYL-L-CYSTEINE) 600 MG CAPS Take 600 mg by mouth daily.  . Ascorbic Acid (VITAMIN C) POWD Take 520 mg by mouth daily.  Marland Kitchen aspirin EC 81 MG tablet Take 81 mg by mouth daily.  Marland Kitchen CALCIUM-MAG-VIT C-VIT D PO Take 4 tablets by mouth daily.  . Cholecalciferol (VITAMIN D3 PO) Take 15,000 Units by mouth daily.   Marland Kitchen L-Theanine 100 MG CAPS Take 100 mg by mouth daily.  Marland Kitchen lamoTRIgine (LAMICTAL) 100 MG tablet Take 100-200 mg by mouth See admin instructions. 1 pill in AM 2 pills in PM  . levothyroxine (SYNTHROID, LEVOTHROID) 150 MCG tablet Take 150 mcg by mouth daily before breakfast.  . lovastatin (MEVACOR) 40 MG tablet Take 40 mg by mouth daily.   . Omega-3 Fatty Acids (SUPER OMEGA 3 PO) Take 4 capsules by mouth daily.  Marland Kitchen OVER THE COUNTER MEDICATION Place 4 Squirts under the tongue daily. Hemp oil  . OVER THE COUNTER MEDICATION Take 1 tablet by mouth daily. EpiCor  . QUEtiapine (SEROQUEL) 400 MG tablet Take 400 mg by mouth at bedtime.   . risperiDONE (RISPERDAL) 1 MG tablet Take 1 mg by mouth at  bedtime.   . selegiline (ELDEPRYL) 5 MG tablet Take 20 mg by mouth 2 (two) times daily with a meal. AM dose and second dose 4 hours later  . vitamin B-12 (CYANOCOBALAMIN) 1000 MCG tablet Take 1,000 mcg by mouth daily.  . [DISCONTINUED] omeprazole (PRILOSEC) 20 MG capsule Take 20 mg by mouth daily.    No facility-administered encounter medications on file as of 04/02/2018.   :  Review of Systems:  Out of a complete 14 point review of systems, all are reviewed and negative with the exception of these symptoms as listed below: Review of Systems  Neurological:       Pt presents today to discuss his sleep. Pt has never had a sleep study but does endorse snoring.  Epworth Sleepiness Scale 0= would never doze 1= slight chance of dozing 2= moderate chance of dozing 3= high chance of dozing  Sitting and reading: 0 Watching TV: 2 Sitting inactive in a public place (ex. Theater or meeting): 0 As a passenger in a car for an hour without a break: 0 Lying down to rest in the afternoon: 2 Sitting and talking to someone: 0 Sitting quietly after lunch (no alcohol): 0 In a car, while stopped in traffic: 0 Total: 4     Objective:  Neurological Exam  Physical Exam Physical Examination:   Vitals:   04/02/18 1539  BP: 98/68  Pulse: 96    General Examination: The patient is a very pleasant 62 y.o. male in no acute distress. He appears well-developed and well-nourished and well groomed.   HEENT: Normocephalic, atraumatic, pupils  are equal, round and reactive to light and accommodation. He wears corrective eyeglasses. He has bilateral hearing aids in place. Extraocular tracking is good without limitation to gaze excursion or nystagmus noted. Normal smooth pursuit is noted. Hearing is grossly intact. Face is symmetric with normal facial animation and normal facial sensation. Speech is clear with no dysarthria noted. There is no hypophonia. There is no lip, neck/head, jaw or voice tremor. Neck is  supple with full range of passive and active motion. There are no carotid bruits on auscultation. Oropharynx exam reveals: mild mouth dryness, adequate dental hygiene with upper and lower partial plate in place, moderate airway crowding secondary to smaller airway entry and redundant soft palate, tonsils are about 1+, uvula appears a little longer, Mallampati is class III. Neck circumference is 17-1/4 inches. He has a moderate overbite.  Chest: Clear to auscultation without wheezing, rhonchi or crackles noted.  Heart: S1+S2+0, regular and normal without murmurs, rubs or gallops noted.   Abdomen: Soft, non-tender and non-distended with normal bowel sounds appreciated on auscultation.  Extremities: There is no pitting edema in the distal lower extremities bilaterally. Pedal pulses are intact.  Skin: Warm and dry without trophic changes noted.  Musculoskeletal: exam reveals no obvious joint deformities, tenderness or joint swelling or erythema.   Neurologically:  Mental status: The patient is awake, alert and oriented in all 4 spheres. His immediate and remote memory, attention, language skills and fund of knowledge are appropriate. There is no evidence of aphasia, agnosia, apraxia or anomia. Speech is clear with normal prosody and enunciation. Thought process is linear. Mood is normal and affect is normal.  Cranial nerves II - XII are as described above under HEENT exam. In addition: shoulder shrug is normal with equal shoulder height noted. Motor exam: Normal bulk, strength and tone is noted. There is no drift, tremor. Fine motor skills and coordination: intact with normal finger taps, normal hand movements, normal rapid alternating patting, normal foot taps and normal foot agility.  Cerebellar testing: No dysmetria or intention tremor on finger to nose testing. Heel to shin is unremarkable bilaterally. There is no truncal or gait ataxia.  Sensory exam: intact to light touch in the upper and lower  extremities.  Gait, station and balance: He stands easily. No veering to one side is noted. No leaning to one side is noted. Posture is age-appropriate and stance is narrow based. Gait shows normal stride length and normal pace. No problems turning are noted.                Assessment and Plan:   In summary, Reginald Tucker is a very pleasant 62 y.o.-year old male with an underlying medical history of hypertension, bipolar disease, reflux disease, chronic kidney disease, arthritis, anxiety, COPD, BPH, balance problems, hypothyroidism, hyperlipidemia, vitamin D deficiency and obesity, whose history and physical exam are concerning for obstructive sleep apnea (OSA). I had a long chat with the patient and his wife about my findings and the diagnosis of OSA, its prognosis and treatment options. We talked about medical treatments, surgical interventions and non-pharmacological approaches. I explained in particular the risks and ramifications of untreated moderate to severe OSA, especially with respect to developing cardiovascular disease down the Road, including congestive heart failure, difficult to treat hypertension, cardiac arrhythmias, or stroke. Even type 2 diabetes has, in part, been linked to untreated OSA. Symptoms of untreated OSA include daytime sleepiness, memory problems, mood irritability and mood disorder such as depression and anxiety, lack of  energy, as well as recurrent headaches, especially morning headaches. We talked about trying to maintain a healthy lifestyle in general, as well as the importance of weight control. I encouraged the patient to eat healthy, exercise daily and keep well hydrated, to keep a scheduled bedtime and wake time routine, to not skip any meals and eat healthy snacks in between meals. I advised the patient not to drive when feeling sleepy. I recommended the following at this time: sleep study with potential positive airway pressure titration. (We will score hypopneas at  4%).   I explained the sleep test procedure to the patient and also outlined possible surgical and non-surgical treatment options of OSA, including the use of a custom-made dental device (which would require a referral to a specialist dentist or oral surgeon), upper airway surgical options, such as pillar implants, radiofrequency surgery, tongue base surgery, and UPPP (which would involve a referral to an ENT surgeon). Rarely, jaw surgery such as mandibular advancement may be considered.  I also explained the CPAP treatment option to the patient, who indicated that he would be willing to try CPAP if the need arises. I explained the importance of being compliant with PAP treatment, not only for insurance purposes but primarily to improve His symptoms, and for the patient's long term health benefit, including to reduce His cardiovascular risks. I answered all their questions today and the patient and his wife were in agreement. I would like to see him back after the sleep study is completed and encouraged him to call with any interim questions, concerns, problems or updates.   Thank you very much for allowing me to participate in the care of this nice patient. If I can be of any further assistance to you please do not hesitate to talk to me.   Sincerely,   Star Age, MD, PhD

## 2018-04-02 NOTE — Patient Instructions (Signed)

## 2018-04-04 ENCOUNTER — Encounter: Payer: Self-pay | Admitting: Physical Therapy

## 2018-04-04 ENCOUNTER — Ambulatory Visit: Payer: PPO | Admitting: Physical Therapy

## 2018-04-04 DIAGNOSIS — M6281 Muscle weakness (generalized): Secondary | ICD-10-CM

## 2018-04-04 DIAGNOSIS — R2689 Other abnormalities of gait and mobility: Secondary | ICD-10-CM

## 2018-04-04 DIAGNOSIS — R2681 Unsteadiness on feet: Secondary | ICD-10-CM | POA: Diagnosis not present

## 2018-04-04 NOTE — Therapy (Signed)
[facility] [place] [address]  [address] [place], [place], [address] Phone: [phone]   Fax:  [phone]  Physical Therapy Treatment  Patient Details  Name: Reginald Tucker MRN: [id number] Date of Birth: 04/06/1956 Referring Provider: Dr. Ahern   Encounter Date: 04/04/2018  PT End of Session - 04/04/18 1022    Visit Number  7    Number of Visits  8    Date for PT Re-Evaluation  04/08/18    Authorization Type  [employer]    PT Start Time  1015    PT Stop Time  1043 d/c    PT Time Calculation (min)  28 min    Activity Tolerance  Patient tolerated treatment well    Behavior During Therapy  WFL for tasks assessed/performed       Past Medical History:  Diagnosis Date  . Acquired hallux rigidus of right foot 04/26/2017  . AKI (acute kidney injury) (HCC) 06/11/2017  . Anxiety   . Arthritis   . Bipolar 1 disorder (HCC)   . BMI 37.0-37.9, adult   . BPH (benign prostatic hyperplasia)   . Cataract   . Cataract    L eye  . CKD (chronic kidney disease), stage III (HCC)   . Colon polyps   . COPD GOLD II with restrictive component  01/13/2016   Spirometry 01/13/2016  FEV1 1.84 (47%)  Ratio 62  - 01/13/2016  extensive coaching HFA effectiveness =    90% > try stiolto respimat 2 pffs each am > did not benefit so stopped when sample out - 01/13/2016  Walked RA x 3 laps @ 185 ft each stopped due to  End of study, nl pace, no desat  / min sob  - PFT's  03/16/2016  FEV1 2.28 (59 % ) ratio 67  p 12 % improvement from saba p no prior to study with DLCO  66 % corrects to 86 % for alv volume     . Depression   . Essential hypertension 01/19/2015  . Family history of coronary arteriosclerosis 01/19/2015   Father with MI   . Fatty liver   . GERD (gastroesophageal reflux disease) 08/11/2014  . Headache    Aura without headache  . History of colon polyps 06/18/2017  . Hyperlipidemia   . Hypertension   . Hypothyroidism 08/11/2014  . Hypothyroidism   .  Insomnia 06/18/2017  . Insomnia   . Memory change   . Mixed hyperlipidemia 06/18/2017  . Morbid obesity (HCC) 03/18/2016   Complicated by HBP/ Low erv on pfts 03/16/2016 (31%)    . Morbid obesity due to excess calories (HCC)   . Prediabetes   . Shortness of breath 06/18/2017  . SOB (shortness of breath)   . Thyroid disease   . Tinnitus of both ears 06/18/2017  . Tobacco use 06/18/2017  . Unsteadiness on feet   . Vitamin D deficiency 06/18/2017  . Vitamin D deficiency     Past Surgical History:  Procedure Laterality Date  . COLONOS    . COLONSCOPY    . CYST EXCISION  12/24/2016   sebaceous cyst chest wall  Dr. Wyatt  . MASS EXCISION Left 12/24/2016   Procedure: EXCISION LEFT CHEST WALL SEBACEOUS CYST;  Surgeon: Romulo Wyatt, MD;  Location: [facility];  Service: General;  Laterality: Left;  . RADIOLOGY WITH ANESTHESIA N/A 02/11/2018   Procedure: MRI OF BRAIN WITHOUT CONTRAST AND LUMBER SPINE AND CERVICAL;  Surgeon: Radiologist, Medication,   MD;  Location: [facility];  Service: Radiology;  Laterality: N/A;  . root tip removal    . TOE FUSION Right 2018    There were no vitals filed for this visit.  Subjective Assessment - 04/04/18 1021    Subjective  doing well - consistent with HEP    Pertinent History  bipolar, CKD stage III, depression, HTN    Diagnostic tests  MRI of brain, C-spine, and L-spine: largely unremarkable, some facet arthritis of C-spine    Patient Stated Goals  improve balance and strength    Currently in Pain?  No/denies    Pain Score  0-No pain         [facility] PT Assessment - 04/04/18 0001      Standardized Balance Assessment   Standardized Balance Assessment  Berg Balance Test;Dynamic Gait Index      Berg Balance Test   Sit to Stand  Able to stand without using hands and stabilize independently    Standing Unsupported  Able to stand safely 2 minutes    Sitting with Back Unsupported but Feet Supported on Floor or Stool  Able to sit safely and securely 2  minutes    Stand to Sit  Sits safely with minimal use of hands    Transfers  Able to transfer safely, minor use of hands    Standing Unsupported with Eyes Closed  Able to stand 10 seconds safely    Standing Ubsupported with Feet Together  Able to place feet together independently and stand 1 minute safely    From Standing, Reach Forward with Outstretched Arm  Can reach confidently >25 cm (10")    From Standing Position, Pick up Object from Floor  Able to pick up shoe safely and easily    From Standing Position, Turn to Look Behind Over each Shoulder  Looks behind from both sides and weight shifts well    Turn 360 Degrees  Able to turn 360 degrees safely in 4 seconds or less    Standing Unsupported, Alternately Place Feet on Step/Stool  Able to stand independently and safely and complete 8 steps in 20 seconds    Standing Unsupported, One Foot in Front  Able to place foot tandem independently and hold 30 seconds    Standing on One Leg  Able to lift leg independently and hold 5-10 seconds    Total Score  55      Dynamic Gait Index   Level Surface  Normal    Change in Gait Speed  Normal    Gait with Horizontal Head Turns  Normal    Gait with Vertical Head Turns  Normal    Gait and Pivot Turn  Normal    Step Over Obstacle  Normal    Step Around Obstacles  Normal    Steps  Mild Impairment    Total Score  23                   [facility] Adult PT Treatment/Exercise - 04/04/18 0001      Knee/Hip Exercises: Aerobic   Recumbent Bike  L 3 x 6 in      Knee/Hip Exercises: Machines for Strengthening   Cybex Knee Extension  B LE's 20# x 15 reps     Cybex Knee Flexion  B LE's 35# x 15 reps       Knee/Hip Exercises: Standing   Functional Squat  15 reps    Functional Squat Limitations  TRX                    PT Long Term Goals - 04/04/18 1022      PT LONG TERM GOAL #1   Title  patient to be independent with advanced HEP for balance and strengthening    Status  Achieved       PT LONG TERM GOAL #2   Title  patient to improve Berg Balance to >/= 54/56 demonstrating improved balance    Status  Achieved      PT LONG TERM GOAL #3   Title  patient to improve DGI to >/= 22 demonstrating improved balance    Status  Achieved      PT LONG TERM GOAL #4   Title  patient to improve B LE strength to >/= 4+/5 for improved function    Status  Achieved      PT LONG TERM GOAL #5   Title  patient to report no LOB or falls for >/= 1 month    Status  Achieved            Plan - 04/04/18 1058    Clinical Impression Statement  Reginald Tucker has progressed wonderfully with skilled PT intervention. All established goals met at this time - great improvements in Berg and DGI with near perfect scores. Patient noting no falls or LOB wihtin the home or community as well as subjective reports of excellent compliance with HEP. Patient and PT planning on d/c today as he is able to transition to HEP independently. Will gladly see patient in the future for any other needs.     PT Treatment/Interventions  ADLs/Self Care Home Management;Cryotherapy;Electrical Stimulation;Moist Heat;Therapeutic exercise;Therapeutic activities;Functional mobility training;Stair training;Gait training;Balance training;Neuromuscular re-education;Patient/family education;Manual techniques;Vasopneumatic Device;Taping;Dry needling;Passive range of motion    Consulted and Agree with Plan of Care  Patient       Patient will benefit from skilled therapeutic intervention in order to improve the following deficits and impairments:  Decreased strength, Decreased balance, Decreased mobility  Visit Diagnosis: Unsteadiness on feet  Other abnormalities of gait and mobility  Muscle weakness (generalized)     Problem List Patient Active Problem List   Diagnosis Date Noted  . Falls 01/27/2018  . H/O toe surgery 06/20/2017  . Acute renal failure superimposed on stage 3 chronic kidney disease (HCC) 06/20/2017  .  Hyperkalemia 06/20/2017  . BPH (benign prostatic hyperplasia) 06/18/2017  . History of colon polyps 06/18/2017  . Insomnia 06/18/2017  . Mixed hyperlipidemia 06/18/2017  . Shortness of breath 06/18/2017  . Tinnitus of both ears 06/18/2017  . Tobacco use 06/18/2017  . Vitamin D deficiency 06/18/2017  . AKI (acute kidney injury) (HCC) 06/11/2017  . Acquired hallux rigidus of right foot 04/26/2017  . Morbid obesity (HCC) 03/18/2016  . COPD GOLD II with restrictive component  01/13/2016  . Family history of coronary arteriosclerosis 01/19/2015  . Essential hypertension 01/19/2015  . Bipolar 1 disorder (HCC) 08/11/2014  . GERD (gastroesophageal reflux disease) 08/11/2014  . Hypothyroidism 08/11/2014      R , PT, DPT 04/04/18 11:03 AM    [facility] [place] [address]  [address] [place], [place], [address] Phone: [phone]   Fax:  [phone]  Name: Reginald Tucker MRN: [id number] Date of Birth: 05/23/1956   

## 2018-04-22 DIAGNOSIS — F3132 Bipolar disorder, current episode depressed, moderate: Secondary | ICD-10-CM | POA: Diagnosis not present

## 2018-05-13 DIAGNOSIS — I959 Hypotension, unspecified: Secondary | ICD-10-CM | POA: Diagnosis not present

## 2018-05-13 DIAGNOSIS — E782 Mixed hyperlipidemia: Secondary | ICD-10-CM | POA: Diagnosis not present

## 2018-05-13 DIAGNOSIS — R5383 Other fatigue: Secondary | ICD-10-CM | POA: Diagnosis not present

## 2018-05-13 DIAGNOSIS — E559 Vitamin D deficiency, unspecified: Secondary | ICD-10-CM | POA: Diagnosis not present

## 2018-05-13 DIAGNOSIS — E039 Hypothyroidism, unspecified: Secondary | ICD-10-CM | POA: Diagnosis not present

## 2018-05-16 DIAGNOSIS — I493 Ventricular premature depolarization: Secondary | ICD-10-CM | POA: Diagnosis not present

## 2018-05-16 DIAGNOSIS — I959 Hypotension, unspecified: Secondary | ICD-10-CM | POA: Diagnosis not present

## 2018-05-16 DIAGNOSIS — R002 Palpitations: Secondary | ICD-10-CM | POA: Diagnosis not present

## 2018-05-20 DIAGNOSIS — I499 Cardiac arrhythmia, unspecified: Secondary | ICD-10-CM | POA: Diagnosis not present

## 2018-05-20 DIAGNOSIS — I959 Hypotension, unspecified: Secondary | ICD-10-CM | POA: Diagnosis not present

## 2018-05-22 DIAGNOSIS — E785 Hyperlipidemia, unspecified: Secondary | ICD-10-CM | POA: Diagnosis not present

## 2018-05-22 DIAGNOSIS — E039 Hypothyroidism, unspecified: Secondary | ICD-10-CM | POA: Diagnosis not present

## 2018-05-22 DIAGNOSIS — J449 Chronic obstructive pulmonary disease, unspecified: Secondary | ICD-10-CM | POA: Diagnosis not present

## 2018-05-22 DIAGNOSIS — I959 Hypotension, unspecified: Secondary | ICD-10-CM | POA: Diagnosis not present

## 2018-05-22 DIAGNOSIS — R002 Palpitations: Secondary | ICD-10-CM | POA: Diagnosis not present

## 2018-05-22 DIAGNOSIS — I493 Ventricular premature depolarization: Secondary | ICD-10-CM | POA: Diagnosis not present

## 2018-05-29 DIAGNOSIS — I959 Hypotension, unspecified: Secondary | ICD-10-CM | POA: Diagnosis not present

## 2018-05-29 DIAGNOSIS — E785 Hyperlipidemia, unspecified: Secondary | ICD-10-CM | POA: Diagnosis not present

## 2018-05-29 DIAGNOSIS — I493 Ventricular premature depolarization: Secondary | ICD-10-CM | POA: Diagnosis not present

## 2018-05-29 DIAGNOSIS — I700 Atherosclerosis of aorta: Secondary | ICD-10-CM | POA: Diagnosis not present

## 2018-05-29 DIAGNOSIS — R002 Palpitations: Secondary | ICD-10-CM | POA: Diagnosis not present

## 2018-05-29 DIAGNOSIS — J449 Chronic obstructive pulmonary disease, unspecified: Secondary | ICD-10-CM | POA: Diagnosis not present

## 2018-05-29 DIAGNOSIS — E039 Hypothyroidism, unspecified: Secondary | ICD-10-CM | POA: Diagnosis not present

## 2018-05-29 DIAGNOSIS — F3181 Bipolar II disorder: Secondary | ICD-10-CM | POA: Diagnosis not present

## 2018-05-30 DIAGNOSIS — I359 Nonrheumatic aortic valve disorder, unspecified: Secondary | ICD-10-CM | POA: Diagnosis not present

## 2018-06-02 DIAGNOSIS — J449 Chronic obstructive pulmonary disease, unspecified: Secondary | ICD-10-CM | POA: Diagnosis not present

## 2018-06-02 DIAGNOSIS — I959 Hypotension, unspecified: Secondary | ICD-10-CM | POA: Diagnosis not present

## 2018-06-02 DIAGNOSIS — E039 Hypothyroidism, unspecified: Secondary | ICD-10-CM | POA: Diagnosis not present

## 2018-06-02 DIAGNOSIS — I493 Ventricular premature depolarization: Secondary | ICD-10-CM | POA: Diagnosis not present

## 2018-06-02 DIAGNOSIS — F3181 Bipolar II disorder: Secondary | ICD-10-CM | POA: Diagnosis not present

## 2018-06-02 DIAGNOSIS — R002 Palpitations: Secondary | ICD-10-CM | POA: Diagnosis not present

## 2018-06-02 DIAGNOSIS — I700 Atherosclerosis of aorta: Secondary | ICD-10-CM | POA: Diagnosis not present

## 2018-06-02 DIAGNOSIS — E785 Hyperlipidemia, unspecified: Secondary | ICD-10-CM | POA: Diagnosis not present

## 2018-06-03 DIAGNOSIS — F3132 Bipolar disorder, current episode depressed, moderate: Secondary | ICD-10-CM | POA: Diagnosis not present

## 2018-06-04 DIAGNOSIS — K429 Umbilical hernia without obstruction or gangrene: Secondary | ICD-10-CM | POA: Diagnosis not present

## 2018-06-04 DIAGNOSIS — I951 Orthostatic hypotension: Secondary | ICD-10-CM | POA: Diagnosis not present

## 2018-06-27 DIAGNOSIS — F3132 Bipolar disorder, current episode depressed, moderate: Secondary | ICD-10-CM | POA: Diagnosis not present

## 2018-07-01 ENCOUNTER — Ambulatory Visit: Payer: Self-pay | Admitting: General Surgery

## 2018-07-01 DIAGNOSIS — K429 Umbilical hernia without obstruction or gangrene: Secondary | ICD-10-CM | POA: Diagnosis not present

## 2018-08-12 NOTE — Pre-Procedure Instructions (Signed)
Reginald Tucker  08/12/2018      [facility] [place], [place] - [place] AT [address] [place] [address] Phone: [phone] Fax: [phone]    Your procedure is scheduled on Wednesday September 9th.  Report to [facility] Admitting at [place].  Call this number if you have problems the morning of surgery:  [phone]   Remember:  Do not eat or drink after midnight.    Take these medicines the morning of surgery with A SIP OF WATER   Lamictal  Synthroid  Prilosec  Mirapex  Risperidone  Selegiline   7 days prior to surgery STOP taking any Aspirin(unless otherwise instructed by your surgeon), Aleve, Naproxen, Ibuprofen, Motrin, Advil, Goody's, BC's, all herbal medications, fish oil, and all vitamins   Do not wear jewelry.  Do not wear lotions, powders, or colognes, or deodorant.  Do not shave 48 hours prior to surgery.  Men may shave face and neck.  Do not bring valuables to the hospital.  [facility] is not responsible for any belongings or valuables.  Contacts, dentures or bridgework may not be worn into surgery.  Leave your suitcase in the car.  After surgery it may be brought to your room.  For patients admitted to the hospital, discharge time will be determined by your treatment team.  Patients discharged the day of surgery will not be allowed to drive home.    [place]- Preparing For Surgery  Before surgery, you can play an important role. Because skin is not sterile, your skin needs to be as free of germs as possible. You can reduce the number of germs on your skin by washing with CHG (chlorahexidine gluconate) Soap before surgery.  CHG is an antiseptic cleaner which kills germs and bonds with the skin to continue killing germs even after washing.    Oral Hygiene is also important to reduce your risk of infection.  Remember - BRUSH YOUR TEETH THE MORNING OF SURGERY WITH YOUR REGULAR  TOOTHPASTE  Please do not use if you have an allergy to CHG or antibacterial soaps. If your skin becomes reddened/irritated stop using the CHG.  Do not shave (including legs and underarms) for at least 48 hours prior to first CHG shower. It is OK to shave your face.  Please follow these instructions carefully.   1. Shower the NIGHT BEFORE SURGERY and the MORNING OF SURGERY with CHG.   2. If you chose to wash your hair, wash your hair first as usual with your normal shampoo.  3. After you shampoo, rinse your hair and body thoroughly to remove the shampoo.  4. Use CHG as you would any other liquid soap. You can apply CHG directly to the skin and wash gently with a scrungie or a clean washcloth.   5. Apply the CHG Soap to your body ONLY FROM THE NECK DOWN.  Do not use on open wounds or open sores. Avoid contact with your eyes, ears, mouth and genitals (private parts). Wash Face and genitals (private parts)  with your normal soap.  6. Wash thoroughly, paying special attention to the area where your surgery will be performed.  7. Thoroughly rinse your body with warm water from the neck down.  8. DO NOT shower/wash with your normal soap after using and rinsing off the CHG Soap.  9. Pat yourself dry with a CLEAN TOWEL.  10. Wear CLEAN PAJAMAS to bed  the night before surgery, wear comfortable clothes the morning of surgery  11. Place CLEAN SHEETS on your bed the night of your first shower and DO NOT SLEEP WITH PETS.    Day of Surgery:  Do not apply any deodorants/lotions.  Please wear clean clothes to the hospital/surgery center.   Remember to brush your teeth WITH YOUR REGULAR TOOTHPASTE.   Please read over the following fact sheets that you were given. Coughing and Deep Breathing and Surgical Site Infection Prevention

## 2018-08-13 ENCOUNTER — Ambulatory Visit (HOSPITAL_COMMUNITY)
Admission: RE | Admit: 2018-08-13 | Discharge: 2018-08-13 | Disposition: A | Discharge status: Home / Self Care | Payer: PPO | Source: Ambulatory Visit | Attending: General Surgery | Admitting: General Surgery

## 2018-08-13 ENCOUNTER — Encounter (HOSPITAL_COMMUNITY): Payer: Self-pay

## 2018-08-13 ENCOUNTER — Other Ambulatory Visit: Payer: Self-pay

## 2018-08-13 ENCOUNTER — Encounter (HOSPITAL_COMMUNITY)
Admission: RE | Admit: 2018-08-13 | Discharge: 2018-08-13 | Disposition: A | Discharge status: Home / Self Care | Payer: PPO | Source: Ambulatory Visit | Attending: General Surgery | Admitting: General Surgery

## 2018-08-13 DIAGNOSIS — Z01812 Encounter for preprocedural laboratory examination: Secondary | ICD-10-CM | POA: Diagnosis not present

## 2018-08-13 DIAGNOSIS — Z01810 Encounter for preprocedural cardiovascular examination: Secondary | ICD-10-CM | POA: Insufficient documentation

## 2018-08-13 DIAGNOSIS — Z7982 Long term (current) use of aspirin: Secondary | ICD-10-CM | POA: Diagnosis not present

## 2018-08-13 DIAGNOSIS — G4733 Obstructive sleep apnea (adult) (pediatric): Secondary | ICD-10-CM | POA: Diagnosis not present

## 2018-08-13 DIAGNOSIS — N189 Chronic kidney disease, unspecified: Secondary | ICD-10-CM | POA: Diagnosis not present

## 2018-08-13 DIAGNOSIS — Z01818 Encounter for other preprocedural examination: Secondary | ICD-10-CM | POA: Diagnosis not present

## 2018-08-13 DIAGNOSIS — E785 Hyperlipidemia, unspecified: Secondary | ICD-10-CM | POA: Diagnosis not present

## 2018-08-13 DIAGNOSIS — Z79890 Hormone replacement therapy (postmenopausal): Secondary | ICD-10-CM | POA: Insufficient documentation

## 2018-08-13 DIAGNOSIS — F419 Anxiety disorder, unspecified: Secondary | ICD-10-CM | POA: Diagnosis not present

## 2018-08-13 DIAGNOSIS — I129 Hypertensive chronic kidney disease with stage 1 through stage 4 chronic kidney disease, or unspecified chronic kidney disease: Secondary | ICD-10-CM | POA: Diagnosis not present

## 2018-08-13 DIAGNOSIS — K219 Gastro-esophageal reflux disease without esophagitis: Secondary | ICD-10-CM | POA: Insufficient documentation

## 2018-08-13 DIAGNOSIS — K429 Umbilical hernia without obstruction or gangrene: Secondary | ICD-10-CM | POA: Diagnosis not present

## 2018-08-13 DIAGNOSIS — J449 Chronic obstructive pulmonary disease, unspecified: Secondary | ICD-10-CM | POA: Diagnosis not present

## 2018-08-13 DIAGNOSIS — E039 Hypothyroidism, unspecified: Secondary | ICD-10-CM | POA: Diagnosis not present

## 2018-08-13 DIAGNOSIS — Z79899 Other long term (current) drug therapy: Secondary | ICD-10-CM | POA: Diagnosis not present

## 2018-08-13 DIAGNOSIS — F319 Bipolar disorder, unspecified: Secondary | ICD-10-CM | POA: Diagnosis not present

## 2018-08-13 HISTORY — DX: Sleep apnea, unspecified: G47.30

## 2018-08-13 HISTORY — DX: Cardiac arrhythmia, unspecified: I49.9

## 2018-08-13 LAB — CBC WITH DIFFERENTIAL/PLATELET
ABS IMMATURE GRANULOCYTES: 0 10*3/uL (ref 0.0–0.1)
Basophils Absolute: 0.1 10*3/uL (ref 0.0–0.1)
Basophils Relative: 1 %
EOS PCT: 2 %
Eosinophils Absolute: 0.1 10*3/uL (ref 0.0–0.7)
HEMATOCRIT: 50.5 % (ref 39.0–52.0)
HEMOGLOBIN: 16.5 g/dL (ref 13.0–17.0)
Immature Granulocytes: 0 %
LYMPHS ABS: 1.3 10*3/uL (ref 0.7–4.0)
LYMPHS PCT: 19 %
MCH: 31.2 pg (ref 26.0–34.0)
MCHC: 32.7 g/dL (ref 30.0–36.0)
MCV: 95.5 fL (ref 78.0–100.0)
Monocytes Absolute: 0.7 10*3/uL (ref 0.1–1.0)
Monocytes Relative: 10 %
NEUTROS ABS: 4.6 10*3/uL (ref 1.7–7.7)
Neutrophils Relative %: 68 %
Platelets: 192 10*3/uL (ref 150–400)
RBC: 5.29 MIL/uL (ref 4.22–5.81)
RDW: 12 % (ref 11.5–15.5)
WBC: 6.8 10*3/uL (ref 4.0–10.5)

## 2018-08-13 LAB — BASIC METABOLIC PANEL
Anion gap: 7 (ref 5–15)
BUN: 13 mg/dL (ref 8–23)
CHLORIDE: 107 mmol/L (ref 98–111)
CO2: 26 mmol/L (ref 22–32)
Calcium: 9.4 mg/dL (ref 8.9–10.3)
Creatinine, Ser: 1.14 mg/dL (ref 0.61–1.24)
GFR calc Af Amer: >60 mL/min (ref >60)
GFR calc non Af Amer: >60 mL/min (ref >60)
Glucose, Bld: 99 mg/dL (ref 70–99)
POTASSIUM: 4.3 mmol/L (ref 3.5–5.1)
Sodium: 140 mmol/L (ref 135–145)

## 2018-08-13 NOTE — Progress Notes (Signed)
Anesthesia Chart Review:  Case:  [id number] Date/Time:  08/18/18 0715   Procedures:      OPEN HERNIA REPAIR UMBILICAL ADULT WITH MESH (N/A )     INSERTION OF MESH (N/A )   Anesthesia type:  General   Pre-op diagnosis:  SYMPTOMATIC UMBILICA HERNIA   Location:  [facility] OR ROOM 02 / [facility] OR   Surgeon:  Judeth Horn, MD      DISCUSSION: 62 yo male former smoker for above procedure. Pertinent hx includes Anxiety, HTN, Bipolar I, HLD, OSA not on CPAP, Fatty liver, Depression, CKD, Hypothyroid, GERD, COPD GOLD II.  Cardiac eval June 2019 by Dr. Wynonia Lawman. Myoview showed no evidence of ischemia or infarction, normal wall motion. TTE showed moderate concentric LVH, normal wall motion, EF 60%, trace MR, trace TR, mild aortic root dilation.  Anticipate he can proceed with surgery as planned barring acute status change.  VS: BP 112/82   Pulse (!) 105 Comment: notified [place] RN  Temp (!) 36.3 C   Resp 20   Ht 6' (1.829 m)   Wt 97.5 kg   SpO2 94%   BMI 29.15 kg/m   PROVIDERS: [name], MD is PCP  Christinia Gully, MD is Pulmonologist last seen 03/16/2016, advised to f/u PRN at that time  Tollie Eth, MD is Cardiologist   LABS: Labs reviewed: Acceptable for surgery. (all labs ordered are listed, but only abnormal results are displayed)  Labs Reviewed  BASIC METABOLIC PANEL  CBC WITH DIFFERENTIAL/PLATELET     IMAGES: CHEST - 2 VIEW 08/13/2018  COMPARISON:  Chest x-ray dated December 19, 2016.  FINDINGS: The heart size and mediastinal contours are within normal limits. Normal pulmonary vascularity. No focal consolidation, pleural effusion, or pneumothorax. No acute osseous abnormality.  IMPRESSION: No active cardiopulmonary disease  EKG: 08/13/2018: NSR   CV: TTE 05/30/2018: Findings: 1.  Left ventricle cavity is normal in size.  Moderate concentric hypertrophy of the left ventricle.  Normal global wall motion. 2.  Left atrial cavity is mildly dilated. 3.  Right atrial  cavity is normal in size. 4.  Right ventricle cavity is normal in size.  Normal right ventricular function. 5.  Structurally normal trileaflet aortic valve with no regurgitation noted. 6.  Trace mitral regurgitation. 7.  Trace tricuspid regurgitation. 8.  Structurally normal pulmonic valve with no regurgitation noted. 9.  No evidence of significant pericardial effusion. 10.  The aortic root is mildly dilated. 11.  Normal pulmonary artery. 12.  IVC is normal respiratory variation.  Conclusions: 1.  Moderate concentric LVH with normal global wall motion.  Calculated EF 60%. 2.  Left atrial cavity is mildly dilated. 3.  Trace mitral regurgitation. 4.  Trace tricuspid regurgitation. 5.  The aortic root is mildly dilated.  Myoview 05/29/2018: EKG data: Resting EKG is normal at rest.  With exercise he exceeded his target heart rate and had no ST depression consistent with ischemia.  Scintigraphic data: The quality of study is good.  Review of data in cine format shows no significant motion.  No significant soft tissue attenuation noted.TID = 0.62.  EDV =72 cc.  ESV = 2360.  Perfusion imaging shows normal myocardial perfusion with no evidence of ischemia or infarction.  Quantitative gated SPECT analysis shows an ejection fraction of 67% with normal wall motion and wall thickening.  Impression: 1.  Normal stress Myoview study with no evidence of ischemia or infarction. 2.  Normal quantitative gated SPECT ejection fraction 67% with normal wall motion and wall thickening.  3.  Reduced exercise capacity. Recommend agents: Reduced exercise capacity but no evidence of myocardial ischemia.  Past Medical History:  Diagnosis Date  . Acquired hallux rigidus of right foot 04/26/2017  . AKI (acute kidney injury) ([place]) 06/11/2017  . Anxiety   . Bipolar 1 disorder ([place])   . BMI 37.0-37.9, adult   . BPH (benign prostatic hyperplasia)   . Cataract   . Cataract    L eye  . CKD (chronic kidney disease),  stage III ([place])   . Colon polyps   . COPD GOLD II with restrictive component  01/13/2016   Spirometry 01/13/2016  FEV1 1.84 (47%)  Ratio 62  - 01/13/2016  extensive coaching HFA effectiveness =    90% > try stiolto respimat 2 pffs each am > did not benefit so stopped when sample out - 01/13/2016  Walked RA x 3 laps @ 185 ft each stopped due to  End of study, nl pace, no desat  / min sob  - PFT's  03/16/2016  FEV1 2.28 (59 % ) ratio 67  p 12 % improvement from saba p no prior to study with DLCO  66 % corrects to 86 % for alv volume     . Depression   . Dysrhythmia   . Essential hypertension 01/19/2015  . Family history of coronary arteriosclerosis 01/19/2015   Father with MI   . Fatty liver   . GERD (gastroesophageal reflux disease) 08/11/2014  . History of colon polyps 06/18/2017  . Hyperlipidemia   . Hypertension   . Hypothyroidism 08/11/2014  . Hypothyroidism   . Insomnia 06/18/2017  . Insomnia   . Memory change   . Mixed hyperlipidemia 06/18/2017  . Morbid obesity ([place]) 12/17/15   Complicated by HBP/ Low erv on pfts 03/16/2016 (31%)    . Morbid obesity due to excess calories ([place])   . Prediabetes   . Sleep apnea   . SOB (shortness of breath)   . Thyroid disease   . Tinnitus of both ears 06/18/2017  . Tobacco use 06/18/2017  . Unsteadiness on feet   . Vitamin D deficiency 06/18/2017  . Vitamin D deficiency     Past Surgical History:  Procedure Laterality Date  . COLONOS    . COLONSCOPY    . CYST EXCISION  12/24/2016   sebaceous cyst chest wall  Dr. Hulen Skains  . MASS EXCISION Left 12/24/2016   Procedure: EXCISION LEFT CHEST WALL SEBACEOUS CYST;  Surgeon: Judeth Horn, MD;  Location: [facility];  Service: General;  Laterality: Left;  . RADIOLOGY WITH ANESTHESIA N/A 02/11/2018   Procedure: MRI OF BRAIN WITHOUT CONTRAST AND LUMBER SPINE AND CERVICAL;  Surgeon: Radiologist, Medication, MD;  Location: [facility];  Service: Radiology;  Laterality: N/A;  . root tip removal    . TOE FUSION Right 2018     MEDICATIONS: . aspirin EC 81 MG tablet  . Cholecalciferol (VITAMIN D3 PO)  . lamoTRIgine (LAMICTAL) 100 MG tablet  . levothyroxine (SYNTHROID, LEVOTHROID) 150 MCG tablet  . lovastatin (MEVACOR) 40 MG tablet  . MAGNESIUM CITRATE PO  . Omega-3 Fatty Acids (SUPER OMEGA 3 PO)  . omeprazole (PRILOSEC) 20 MG capsule  . OVER THE COUNTER MEDICATION  . pramipexole (MIRAPEX) 0.25 MG tablet  . QUEtiapine (SEROQUEL) 400 MG tablet  . risperiDONE (RISPERDAL) 1 MG tablet  . selegiline (ELDEPRYL) 5 MG tablet  . vitamin B-12 (CYANOCOBALAMIN) 1000 MCG tablet   No current facility-administered medications for this encounter.  [place], [place] [facility] Short Stay Center/Anesthesiology Phone [phone] 08/14/2018 1:07 PM

## 2018-08-13 NOTE — Progress Notes (Signed)
PCP -  Judeth Horn MD Cardiologist -  [place] Standing  Chest x-ray - 08/13/18 EKG - 08/13/18 Stress Test - requested ECHO - Requested  Sleep Study -  04/02/18 CPAP - none  Aspirin Instructions: pt will Call Dr. Wynonia Lawman for instructions on when to stop.   Anesthesia review: yes, Requested cardiac documents.  Patient denies shortness of breath, fever, cough and chest pain at PAT appointment   Patient verbalized understanding of instructions that were given to them at the PAT appointment. Patient was also instructed that they will need to review over the PAT instructions again at home before surgery.

## 2018-08-17 NOTE — Anesthesia Preprocedure Evaluation (Addendum)
Anesthesia Evaluation  Patient identified by MRN, date of birth, ID band Patient awake    Reviewed: Allergy & Precautions, NPO status , Patient's Chart, lab work & pertinent test results  Airway Mallampati: II  TM Distance: >3 FB Neck ROM: Full    Dental no notable dental hx. (+) Poor Dentition,    Pulmonary sleep apnea , former smoker,    Pulmonary exam normal breath sounds clear to auscultation       Cardiovascular hypertension, Normal cardiovascular exam Rhythm:Regular Rate:Normal  Cardiac eval June 2019 by Dr. Wynonia Lawman. Myoview showed no evidence of ischemia or infarction, normal wall motion. TTE showed moderate concentric LVH, normal wall motion, EF 60%, trace MR, trace TR, mild aortic root dilation.   Neuro/Psych Bipolar Disorder    GI/Hepatic GERD  ,  Endo/Other  Hypothyroidism   Renal/GU      Musculoskeletal   Abdominal   Peds  Hematology   Anesthesia Other Findings   Reproductive/Obstetrics                            Lab Results  Component Value Date   WBC 6.8 08/13/2018   HGB 16.5 08/13/2018   HCT 50.5 08/13/2018   MCV 95.5 08/13/2018   PLT 192 08/13/2018    Anesthesia Physical Anesthesia Plan  ASA: III  Anesthesia Plan: General   Post-op Pain Management:    Induction: Intravenous  PONV Risk Score and Plan: Treatment may vary due to age or medical condition, Ondansetron and Dexamethasone  Airway Management Planned: Oral ETT  Additional Equipment:   Intra-op Plan:   Post-operative Plan: Extubation in OR  Informed Consent: I have reviewed the patients History and Physical, chart, labs and discussed the procedure including the risks, benefits and alternatives for the proposed anesthesia with the patient or authorized representative who has indicated his/her understanding and acceptance.   Dental advisory given  Plan Discussed with: CRNA  Anesthesia Plan Comments:          Anesthesia Quick Evaluation

## 2018-08-18 ENCOUNTER — Ambulatory Visit (HOSPITAL_COMMUNITY): Payer: PPO | Admitting: Anesthesiology

## 2018-08-18 ENCOUNTER — Ambulatory Visit (HOSPITAL_COMMUNITY)
Admission: RE | Admit: 2018-08-18 | Discharge: 2018-08-19 | Disposition: A | Discharge status: Home / Self Care | Payer: PPO | Source: Ambulatory Visit | Attending: General Surgery | Admitting: General Surgery

## 2018-08-18 ENCOUNTER — Encounter (HOSPITAL_COMMUNITY)
Admission: RE | Disposition: A | Discharge status: Home / Self Care | Payer: Self-pay | Source: Ambulatory Visit | Attending: General Surgery

## 2018-08-18 ENCOUNTER — Ambulatory Visit (HOSPITAL_COMMUNITY): Payer: PPO | Admitting: Physician Assistant

## 2018-08-18 ENCOUNTER — Encounter (HOSPITAL_COMMUNITY): Payer: Self-pay | Admitting: Anesthesiology

## 2018-08-18 DIAGNOSIS — K219 Gastro-esophageal reflux disease without esophagitis: Secondary | ICD-10-CM | POA: Diagnosis not present

## 2018-08-18 DIAGNOSIS — Z86010 Personal history of colonic polyps: Secondary | ICD-10-CM | POA: Diagnosis not present

## 2018-08-18 DIAGNOSIS — H9313 Tinnitus, bilateral: Secondary | ICD-10-CM | POA: Diagnosis not present

## 2018-08-18 DIAGNOSIS — F419 Anxiety disorder, unspecified: Secondary | ICD-10-CM | POA: Insufficient documentation

## 2018-08-18 DIAGNOSIS — N183 Chronic kidney disease, stage 3 (moderate): Secondary | ICD-10-CM | POA: Insufficient documentation

## 2018-08-18 DIAGNOSIS — I499 Cardiac arrhythmia, unspecified: Secondary | ICD-10-CM | POA: Insufficient documentation

## 2018-08-18 DIAGNOSIS — K760 Fatty (change of) liver, not elsewhere classified: Secondary | ICD-10-CM | POA: Insufficient documentation

## 2018-08-18 DIAGNOSIS — Z8249 Family history of ischemic heart disease and other diseases of the circulatory system: Secondary | ICD-10-CM | POA: Diagnosis not present

## 2018-08-18 DIAGNOSIS — J449 Chronic obstructive pulmonary disease, unspecified: Secondary | ICD-10-CM | POA: Insufficient documentation

## 2018-08-18 DIAGNOSIS — F319 Bipolar disorder, unspecified: Secondary | ICD-10-CM | POA: Diagnosis not present

## 2018-08-18 DIAGNOSIS — I10 Essential (primary) hypertension: Secondary | ICD-10-CM | POA: Diagnosis not present

## 2018-08-18 DIAGNOSIS — G4700 Insomnia, unspecified: Secondary | ICD-10-CM | POA: Insufficient documentation

## 2018-08-18 DIAGNOSIS — R7303 Prediabetes: Secondary | ICD-10-CM | POA: Diagnosis not present

## 2018-08-18 DIAGNOSIS — Z7982 Long term (current) use of aspirin: Secondary | ICD-10-CM | POA: Insufficient documentation

## 2018-08-18 DIAGNOSIS — E559 Vitamin D deficiency, unspecified: Secondary | ICD-10-CM | POA: Diagnosis not present

## 2018-08-18 DIAGNOSIS — E785 Hyperlipidemia, unspecified: Secondary | ICD-10-CM | POA: Insufficient documentation

## 2018-08-18 DIAGNOSIS — Z87891 Personal history of nicotine dependence: Secondary | ICD-10-CM | POA: Insufficient documentation

## 2018-08-18 DIAGNOSIS — Z888 Allergy status to other drugs, medicaments and biological substances status: Secondary | ICD-10-CM | POA: Insufficient documentation

## 2018-08-18 DIAGNOSIS — E039 Hypothyroidism, unspecified: Secondary | ICD-10-CM | POA: Diagnosis not present

## 2018-08-18 DIAGNOSIS — I129 Hypertensive chronic kidney disease with stage 1 through stage 4 chronic kidney disease, or unspecified chronic kidney disease: Secondary | ICD-10-CM | POA: Diagnosis not present

## 2018-08-18 DIAGNOSIS — N400 Enlarged prostate without lower urinary tract symptoms: Secondary | ICD-10-CM | POA: Insufficient documentation

## 2018-08-18 DIAGNOSIS — K429 Umbilical hernia without obstruction or gangrene: Secondary | ICD-10-CM | POA: Diagnosis not present

## 2018-08-18 DIAGNOSIS — R2681 Unsteadiness on feet: Secondary | ICD-10-CM | POA: Diagnosis not present

## 2018-08-18 DIAGNOSIS — Z9889 Other specified postprocedural states: Secondary | ICD-10-CM

## 2018-08-18 DIAGNOSIS — Z981 Arthrodesis status: Secondary | ICD-10-CM | POA: Diagnosis not present

## 2018-08-18 DIAGNOSIS — G4730 Sleep apnea, unspecified: Secondary | ICD-10-CM | POA: Insufficient documentation

## 2018-08-18 DIAGNOSIS — Z882 Allergy status to sulfonamides status: Secondary | ICD-10-CM | POA: Insufficient documentation

## 2018-08-18 DIAGNOSIS — Z6829 Body mass index (BMI) 29.0-29.9, adult: Secondary | ICD-10-CM | POA: Insufficient documentation

## 2018-08-18 DIAGNOSIS — Z8719 Personal history of other diseases of the digestive system: Secondary | ICD-10-CM

## 2018-08-18 HISTORY — PX: UMBILICAL HERNIA REPAIR: SHX196

## 2018-08-18 SURGERY — REPAIR, HERNIA, UMBILICAL, ADULT
Anesthesia: General | Site: Abdomen

## 2018-08-18 MED ORDER — MEPERIDINE HCL 50 MG/ML IJ SOLN: 6.2500 mg | INTRAMUSCULAR | Status: DC | PRN

## 2018-08-18 MED ORDER — PRAMIPEXOLE DIHYDROCHLORIDE 0.25 MG PO TABS
0.2500 mg | ORAL_TABLET | Freq: Two times a day (BID) | ORAL | Status: DC
  Administered 2018-08-18 – 2018-08-19 (×2): 0.25 mg via ORAL
  Filled 2018-08-18 (×2): qty 1

## 2018-08-18 MED ORDER — FENTANYL CITRATE (PF) 250 MCG/5ML IJ SOLN
INTRAMUSCULAR | Status: DC | PRN
  Administered 2018-08-18: 25 ug via INTRAVENOUS
  Administered 2018-08-18: 100 ug via INTRAVENOUS
  Administered 2018-08-18: 25 ug via INTRAVENOUS

## 2018-08-18 MED ORDER — RISPERIDONE 0.5 MG PO TABS: 0.5000 mg | ORAL_TABLET | ORAL | Status: DC

## 2018-08-18 MED ORDER — GABAPENTIN 300 MG PO CAPS
300.0000 mg | ORAL_CAPSULE | Freq: Two times a day (BID) | ORAL | Status: DC
  Administered 2018-08-18 – 2018-08-19 (×3): 300 mg via ORAL
  Filled 2018-08-18 (×3): qty 1

## 2018-08-18 MED ORDER — CEFAZOLIN SODIUM-DEXTROSE 2-4 GM/100ML-% IV SOLN
INTRAVENOUS | Status: AC
  Filled 2018-08-18: qty 100

## 2018-08-18 MED ORDER — RISPERIDONE 0.5 MG PO TABS
0.5000 mg | ORAL_TABLET | Freq: Every day | ORAL | Status: DC
  Administered 2018-08-19: 0.5 mg via ORAL
  Filled 2018-08-18: qty 1

## 2018-08-18 MED ORDER — MIDAZOLAM HCL 2 MG/2ML IJ SOLN
INTRAMUSCULAR | Status: AC
  Filled 2018-08-18: qty 2

## 2018-08-18 MED ORDER — VITAMIN B-12 1000 MCG PO TABS
1000.0000 ug | ORAL_TABLET | Freq: Every day | ORAL | Status: DC
  Administered 2018-08-18 – 2018-08-19 (×2): 1000 ug via ORAL
  Filled 2018-08-18 (×3): qty 1

## 2018-08-18 MED ORDER — GABAPENTIN 300 MG PO CAPS
300.0000 mg | ORAL_CAPSULE | ORAL | Status: AC
  Administered 2018-08-18: 300 mg via ORAL

## 2018-08-18 MED ORDER — MIDAZOLAM HCL 5 MG/5ML IJ SOLN
INTRAMUSCULAR | Status: DC | PRN
  Administered 2018-08-18: 2 mg via INTRAVENOUS

## 2018-08-18 MED ORDER — PANTOPRAZOLE SODIUM 40 MG PO TBEC
40.0000 mg | DELAYED_RELEASE_TABLET | Freq: Every day | ORAL | Status: DC
  Administered 2018-08-18 – 2018-08-19 (×2): 40 mg via ORAL
  Filled 2018-08-18 (×2): qty 1

## 2018-08-18 MED ORDER — LEVOTHYROXINE SODIUM 75 MCG PO TABS
150.0000 ug | ORAL_TABLET | Freq: Every day | ORAL | Status: DC
  Administered 2018-08-19: 150 ug via ORAL
  Filled 2018-08-18: qty 2

## 2018-08-18 MED ORDER — LAMOTRIGINE 100 MG PO TABS
100.0000 mg | ORAL_TABLET | Freq: Every day | ORAL | Status: DC
  Administered 2018-08-18 – 2018-08-19 (×2): 100 mg via ORAL
  Filled 2018-08-18 (×2): qty 1

## 2018-08-18 MED ORDER — ACETAMINOPHEN 500 MG PO TABS
1000.0000 mg | ORAL_TABLET | Freq: Four times a day (QID) | ORAL | Status: DC
  Administered 2018-08-18 – 2018-08-19 (×3): 1000 mg via ORAL
  Filled 2018-08-18 (×4): qty 2

## 2018-08-18 MED ORDER — HYDROMORPHONE HCL 1 MG/ML IJ SOLN: 0.5000 mg | INTRAMUSCULAR | Status: DC | PRN

## 2018-08-18 MED ORDER — PROPOFOL 10 MG/ML IV BOLUS
INTRAVENOUS | Status: DC | PRN
  Administered 2018-08-18: 150 mg via INTRAVENOUS

## 2018-08-18 MED ORDER — ONDANSETRON HCL 4 MG/2ML IJ SOLN: 4.0000 mg | Freq: Four times a day (QID) | INTRAMUSCULAR | Status: DC | PRN

## 2018-08-18 MED ORDER — CHLORHEXIDINE GLUCONATE CLOTH 2 % EX PADS: 6.0000 | MEDICATED_PAD | Freq: Once | CUTANEOUS | Status: DC

## 2018-08-18 MED ORDER — ROCURONIUM BROMIDE 10 MG/ML (PF) SYRINGE
PREFILLED_SYRINGE | INTRAVENOUS | Status: DC | PRN
  Administered 2018-08-18: 50 mg via INTRAVENOUS

## 2018-08-18 MED ORDER — SUGAMMADEX SODIUM 200 MG/2ML IV SOLN
INTRAVENOUS | Status: DC | PRN
  Administered 2018-08-18: 200 mg via INTRAVENOUS

## 2018-08-18 MED ORDER — KCL IN DEXTROSE-NACL 10-5-0.45 MEQ/L-%-% IV SOLN
INTRAVENOUS | Status: DC
  Administered 2018-08-18 – 2018-08-19 (×2): via INTRAVENOUS
  Filled 2018-08-18 (×3): qty 1000

## 2018-08-18 MED ORDER — PROMETHAZINE HCL 25 MG/ML IJ SOLN: 6.2500 mg | INTRAMUSCULAR | Status: DC | PRN

## 2018-08-18 MED ORDER — BUPIVACAINE HCL (PF) 0.25 % IJ SOLN
INTRAMUSCULAR | Status: AC
  Filled 2018-08-18: qty 30

## 2018-08-18 MED ORDER — GABAPENTIN 300 MG PO CAPS
ORAL_CAPSULE | ORAL | Status: AC
  Filled 2018-08-18: qty 1

## 2018-08-18 MED ORDER — CLONIDINE HCL 0.2 MG PO TABS
ORAL_TABLET | ORAL | Status: AC
  Filled 2018-08-18: qty 1

## 2018-08-18 MED ORDER — ACETAMINOPHEN 500 MG PO TABS
1000.0000 mg | ORAL_TABLET | ORAL | Status: AC
  Administered 2018-08-18: 1000 mg via ORAL

## 2018-08-18 MED ORDER — QUETIAPINE FUMARATE 100 MG PO TABS
400.0000 mg | ORAL_TABLET | Freq: Every day | ORAL | Status: DC
  Administered 2018-08-18: 400 mg via ORAL
  Filled 2018-08-18: qty 4

## 2018-08-18 MED ORDER — ONDANSETRON HCL 4 MG/2ML IJ SOLN
INTRAMUSCULAR | Status: AC
  Filled 2018-08-18: qty 2

## 2018-08-18 MED ORDER — FENTANYL CITRATE (PF) 250 MCG/5ML IJ SOLN
INTRAMUSCULAR | Status: AC
  Filled 2018-08-18: qty 5

## 2018-08-18 MED ORDER — SELEGILINE HCL 5 MG PO TABS
10.0000 mg | ORAL_TABLET | Freq: Two times a day (BID) | ORAL | Status: DC
  Administered 2018-08-18 – 2018-08-19 (×2): 10 mg via ORAL
  Filled 2018-08-18 (×2): qty 2

## 2018-08-18 MED ORDER — ONDANSETRON HCL 4 MG/2ML IJ SOLN
INTRAMUSCULAR | Status: DC | PRN
  Administered 2018-08-18: 4 mg via INTRAVENOUS

## 2018-08-18 MED ORDER — CEFAZOLIN SODIUM-DEXTROSE 2-4 GM/100ML-% IV SOLN
2.0000 g | INTRAVENOUS | Status: AC
  Administered 2018-08-18: 2 g via INTRAVENOUS

## 2018-08-18 MED ORDER — LIDOCAINE 2% (20 MG/ML) 5 ML SYRINGE
INTRAMUSCULAR | Status: DC | PRN
  Administered 2018-08-18: 100 mg via INTRAVENOUS

## 2018-08-18 MED ORDER — LAMOTRIGINE 100 MG PO TABS: 100.0000 mg | ORAL_TABLET | ORAL | Status: DC

## 2018-08-18 MED ORDER — FENTANYL CITRATE (PF) 100 MCG/2ML IJ SOLN
25.0000 ug | INTRAMUSCULAR | Status: DC | PRN
  Administered 2018-08-18: 50 ug via INTRAVENOUS

## 2018-08-18 MED ORDER — SODIUM CHLORIDE 0.9 % IV SOLN
INTRAVENOUS | Status: AC
  Filled 2018-08-18: qty 500000

## 2018-08-18 MED ORDER — BUPIVACAINE LIPOSOME 1.3 % IJ SUSP
20.0000 mL | Freq: Once | INTRAMUSCULAR | Status: AC
  Administered 2018-08-18: 20 mL
  Filled 2018-08-18: qty 20

## 2018-08-18 MED ORDER — OXYCODONE HCL 5 MG PO TABS
5.0000 mg | ORAL_TABLET | ORAL | Status: DC | PRN
  Administered 2018-08-18 – 2018-08-19 (×4): 10 mg via ORAL
  Filled 2018-08-18 (×4): qty 2

## 2018-08-18 MED ORDER — SODIUM CHLORIDE 0.9 % IV SOLN
INTRAVENOUS | Status: DC | PRN
  Administered 2018-08-18: 40 ug/min via INTRAVENOUS

## 2018-08-18 MED ORDER — PHENYLEPHRINE 40 MCG/ML (10ML) SYRINGE FOR IV PUSH (FOR BLOOD PRESSURE SUPPORT)
PREFILLED_SYRINGE | INTRAVENOUS | Status: DC | PRN
  Administered 2018-08-18: 80 ug via INTRAVENOUS

## 2018-08-18 MED ORDER — PROPOFOL 10 MG/ML IV BOLUS
INTRAVENOUS | Status: AC
  Filled 2018-08-18: qty 40

## 2018-08-18 MED ORDER — LACTATED RINGERS IV SOLN
INTRAVENOUS | Status: DC | PRN
  Administered 2018-08-18: 07:00:00 via INTRAVENOUS

## 2018-08-18 MED ORDER — CLONIDINE HCL 0.2 MG PO TABS
0.2000 mg | ORAL_TABLET | Freq: Once | ORAL | Status: AC
  Administered 2018-08-18: 0.2 mg via ORAL

## 2018-08-18 MED ORDER — FENTANYL CITRATE (PF) 100 MCG/2ML IJ SOLN
INTRAMUSCULAR | Status: AC
  Filled 2018-08-18: qty 2

## 2018-08-18 MED ORDER — TRAMADOL HCL 50 MG PO TABS: 50.0000 mg | ORAL_TABLET | Freq: Four times a day (QID) | ORAL | Status: DC | PRN

## 2018-08-18 MED ORDER — ROCURONIUM BROMIDE 50 MG/5ML IV SOSY
PREFILLED_SYRINGE | INTRAVENOUS | Status: AC
  Filled 2018-08-18: qty 5

## 2018-08-18 MED ORDER — ACETAMINOPHEN 10 MG/ML IV SOLN: 1000.0000 mg | Freq: Once | INTRAVENOUS | Status: DC | PRN

## 2018-08-18 MED ORDER — POLYETHYLENE GLYCOL 3350 17 G PO PACK
17.0000 g | PACK | Freq: Every day | ORAL | Status: DC | PRN
  Administered 2018-08-19: 17 g via ORAL
  Filled 2018-08-18: qty 1

## 2018-08-18 MED ORDER — ONDANSETRON 4 MG PO TBDP: 4.0000 mg | ORAL_TABLET | Freq: Four times a day (QID) | ORAL | Status: DC | PRN

## 2018-08-18 MED ORDER — 0.9 % SODIUM CHLORIDE (POUR BTL) OPTIME
TOPICAL | Status: DC | PRN
  Administered 2018-08-18: 1000 mL

## 2018-08-18 MED ORDER — RISPERIDONE 1 MG PO TABS
1.0000 mg | ORAL_TABLET | Freq: Every day | ORAL | Status: DC
  Administered 2018-08-18: 1 mg via ORAL
  Filled 2018-08-18: qty 1

## 2018-08-18 MED ORDER — CEFAZOLIN SODIUM-DEXTROSE 2-4 GM/100ML-% IV SOLN
2.0000 g | Freq: Three times a day (TID) | INTRAVENOUS | Status: AC
  Administered 2018-08-18: 2 g via INTRAVENOUS
  Filled 2018-08-18: qty 100

## 2018-08-18 MED ORDER — DEXAMETHASONE SODIUM PHOSPHATE 10 MG/ML IJ SOLN
INTRAMUSCULAR | Status: DC | PRN
  Administered 2018-08-18: 10 mg via INTRAVENOUS

## 2018-08-18 MED ORDER — LAMOTRIGINE 100 MG PO TABS
200.0000 mg | ORAL_TABLET | Freq: Every day | ORAL | Status: DC
  Administered 2018-08-18: 200 mg via ORAL
  Filled 2018-08-18: qty 2

## 2018-08-18 MED ORDER — LIDOCAINE HCL (PF) 1 % IJ SOLN
INTRAMUSCULAR | Status: AC
  Filled 2018-08-18: qty 30

## 2018-08-18 MED ORDER — ACETAMINOPHEN 500 MG PO TABS
ORAL_TABLET | ORAL | Status: AC
  Filled 2018-08-18: qty 2

## 2018-08-18 MED ORDER — HYDROCODONE-ACETAMINOPHEN 7.5-325 MG PO TABS: 1.0000 | ORAL_TABLET | Freq: Once | ORAL | Status: DC | PRN

## 2018-08-18 MED ORDER — ENOXAPARIN SODIUM 40 MG/0.4ML ~~LOC~~ SOLN
40.0000 mg | SUBCUTANEOUS | Status: DC
  Administered 2018-08-19: 40 mg via SUBCUTANEOUS
  Filled 2018-08-18: qty 0.4

## 2018-08-18 SURGICAL SUPPLY — 54 items
BAG DECANTER FOR FLEXI CONT (MISCELLANEOUS) ×4 IMPLANT
BINDER ABDOMINAL 12 ML 46-62 (SOFTGOODS) ×4 IMPLANT
BLADE CLIPPER SURG (BLADE) ×4 IMPLANT
BLADE SURG 10 STRL SS (BLADE) ×4 IMPLANT
CANISTER SUCT 3000ML PPV (MISCELLANEOUS) IMPLANT
CHLORAPREP W/TINT 26ML (MISCELLANEOUS) ×4 IMPLANT
CLEANER TIP ELECTROSURG 2X2 (MISCELLANEOUS) ×4 IMPLANT
CLOSURE WOUND 1/2 X4 (GAUZE/BANDAGES/DRESSINGS) ×1
COVER SURGICAL LIGHT HANDLE (MISCELLANEOUS) ×4 IMPLANT
DERMABOND ADVANCED (GAUZE/BANDAGES/DRESSINGS) ×2
DERMABOND ADVANCED .7 DNX12 (GAUZE/BANDAGES/DRESSINGS) ×2 IMPLANT
DRAPE LAPAROTOMY TRNSV 102X78 (DRAPE) ×4 IMPLANT
DRAPE UTILITY XL STRL (DRAPES) ×8 IMPLANT
DRSG TEGADERM 4X4.75 (GAUZE/BANDAGES/DRESSINGS) ×4 IMPLANT
ELECT REM PT RETURN 9FT ADLT (ELECTROSURGICAL) ×4
ELECTRODE REM PT RTRN 9FT ADLT (ELECTROSURGICAL) ×2 IMPLANT
GAUZE SPONGE 2X2 8PLY STRL LF (GAUZE/BANDAGES/DRESSINGS) ×2 IMPLANT
GLOVE BIOGEL PI IND STRL 6.5 (GLOVE) ×2 IMPLANT
GLOVE BIOGEL PI IND STRL 7.5 (GLOVE) ×2 IMPLANT
GLOVE BIOGEL PI IND STRL 8 (GLOVE) ×2 IMPLANT
GLOVE BIOGEL PI INDICATOR 6.5 (GLOVE) ×2
GLOVE BIOGEL PI INDICATOR 7.5 (GLOVE) ×2
GLOVE BIOGEL PI INDICATOR 8 (GLOVE) ×2
GLOVE ECLIPSE 7.5 STRL STRAW (GLOVE) ×8 IMPLANT
GLOVE SURG SS PI 6.5 STRL IVOR (GLOVE) ×4 IMPLANT
GOWN STRL REUS W/ TWL LRG LVL3 (GOWN DISPOSABLE) ×6 IMPLANT
GOWN STRL REUS W/TWL LRG LVL3 (GOWN DISPOSABLE) ×6
KIT BASIN OR (CUSTOM PROCEDURE TRAY) ×4 IMPLANT
KIT TURNOVER KIT B (KITS) ×4 IMPLANT
NEEDLE HYPO 25GX1X1/2 BEV (NEEDLE) ×4 IMPLANT
NS IRRIG 1000ML POUR BTL (IV SOLUTION) ×4 IMPLANT
PACK SURGICAL SETUP 50X90 (CUSTOM PROCEDURE TRAY) ×4 IMPLANT
PAD ARMBOARD 7.5X6 YLW CONV (MISCELLANEOUS) ×4 IMPLANT
PENCIL BUTTON HOLSTER BLD 10FT (ELECTRODE) ×4 IMPLANT
PENCIL SMOKE EVACUATOR (MISCELLANEOUS) ×4 IMPLANT
SPONGE GAUZE 2X2 STER 10/PKG (GAUZE/BANDAGES/DRESSINGS) ×2
SPONGE INTESTINAL PEANUT (DISPOSABLE) ×4 IMPLANT
SPONGE LAP 18X18 X RAY DECT (DISPOSABLE) ×4 IMPLANT
STRIP CLOSURE SKIN 1/2X4 (GAUZE/BANDAGES/DRESSINGS) ×3 IMPLANT
SUT MNCRL AB 4-0 PS2 18 (SUTURE) ×4 IMPLANT
SUT NOVA NAB DX-16 0-1 5-0 T12 (SUTURE) ×4 IMPLANT
SUT NOVA NAB GS-21 0 18 T12 DT (SUTURE) IMPLANT
SUT NOVA NAB GS-21 1 T12 (SUTURE) ×4 IMPLANT
SUT VIC AB 3-0 SH 27 (SUTURE) ×4
SUT VIC AB 3-0 SH 27X BRD (SUTURE) ×4 IMPLANT
SUT VIC AB 4-0 PS2 18 (SUTURE) ×4 IMPLANT
SUT VICRYL AB 3 0 TIES (SUTURE) ×4 IMPLANT
SYR BULB 3OZ (MISCELLANEOUS) ×4 IMPLANT
SYR CONTROL 10ML LL (SYRINGE) ×4 IMPLANT
TOWEL OR 17X24 6PK STRL BLUE (TOWEL DISPOSABLE) ×4 IMPLANT
TOWEL OR 17X26 10 PK STRL BLUE (TOWEL DISPOSABLE) ×4 IMPLANT
TUBE CONNECTING 12'X1/4 (SUCTIONS)
TUBE CONNECTING 12X1/4 (SUCTIONS) IMPLANT
YANKAUER SUCT BULB TIP NO VENT (SUCTIONS) IMPLANT

## 2018-08-18 NOTE — Anesthesia Postprocedure Evaluation (Signed)
Anesthesia Post Note  Patient: Reginald Tucker  Procedure(s) Performed: Umbilical Hernia Repair (N/A Abdomen)     Patient location during evaluation: PACU Anesthesia Type: General Level of consciousness: awake and alert Pain management: pain level controlled Vital Signs Assessment: post-procedure vital signs reviewed and stable Respiratory status: spontaneous breathing, nonlabored ventilation, respiratory function stable and patient connected to nasal cannula oxygen Cardiovascular status: blood pressure returned to baseline and stable Postop Assessment: no apparent nausea or vomiting Anesthetic complications: no    Last Vitals:  Vitals:   08/18/18 0930 08/18/18 0945  BP: 117/75 123/83  Pulse: 89 87  Resp: 20 20  Temp:  (!) 36.3 C  SpO2: 92% 95%    Last Pain:  Vitals:   08/18/18 0945  TempSrc:   PainSc: 3                  [place]et [place]

## 2018-08-18 NOTE — H&P (Addendum)
Reginald Tucker is an 62 y.o. male.   Chief Complaint: Symptomatic periumbilical hernia HPI: Patient has had this hernia for a while,  Increasingly more symptomatic.  Now want repair.  Open procedure selected.  History of COPD  Past Medical History:  Diagnosis Date  . Acquired hallux rigidus of right foot 04/26/2017  . AKI (acute kidney injury) ([place]) 06/11/2017  . Anxiety   . Bipolar 1 disorder ([place])   . BMI 37.0-37.9, adult   . BPH (benign prostatic hyperplasia)   . Cataract   . Cataract    L eye  . CKD (chronic kidney disease), stage III ([place])   . Colon polyps   . COPD GOLD II with restrictive component  01/13/2016   Spirometry 01/13/2016  FEV1 1.84 (47%)  Ratio 62  - 01/13/2016  extensive coaching HFA effectiveness =    90% > try stiolto respimat 2 pffs each am > did not benefit so stopped when sample out - 01/13/2016  Walked RA x 3 laps @ 185 ft each stopped due to  End of study, nl pace, no desat  / min sob  - PFT's  03/16/2016  FEV1 2.28 (59 % ) ratio 67  p 12 % improvement from saba p no prior to study with DLCO  66 % corrects to 86 % for alv volume     . Depression   . Dysrhythmia   . Essential hypertension 01/19/2015  . Family history of coronary arteriosclerosis 01/19/2015   Father with MI   . Fatty liver   . GERD (gastroesophageal reflux disease) 08/11/2014  . History of colon polyps 06/18/2017  . Hyperlipidemia   . Hypertension   . Hypothyroidism 08/11/2014  . Hypothyroidism   . Insomnia 06/18/2017  . Insomnia   . Memory change   . Mixed hyperlipidemia 06/18/2017  . Morbid obesity ([place]) 04/16/5276   Complicated by HBP/ Low erv on pfts 03/16/2016 (31%)    . Morbid obesity due to excess calories ([place])   . Prediabetes   . Sleep apnea   . SOB (shortness of breath)   . Thyroid disease   . Tinnitus of both ears 06/18/2017  . Tobacco use 06/18/2017  . Unsteadiness on feet   . Vitamin D deficiency 06/18/2017  . Vitamin D deficiency     Past Surgical History:  Procedure Laterality Date  .  COLONOS    . COLONSCOPY    . CYST EXCISION  12/24/2016   sebaceous cyst chest wall  Dr. Hulen Skains  . MASS EXCISION Left 12/24/2016   Procedure: EXCISION LEFT CHEST WALL SEBACEOUS CYST;  Surgeon: Judeth Horn, MD;  Location: [facility];  Service: General;  Laterality: Left;  . RADIOLOGY WITH ANESTHESIA N/A 02/11/2018   Procedure: MRI OF BRAIN WITHOUT CONTRAST AND LUMBER SPINE AND CERVICAL;  Surgeon: Radiologist, Medication, MD;  Location: [facility];  Service: Radiology;  Laterality: N/A;  . root tip removal    . TOE FUSION Right 2018    Family History  Problem Relation Age of Onset  . Cancer Mother   . Hyperlipidemia Father   . Hypertension Father   . CAD Father   . Stroke Father   . Aortic aneurysm Father   . Prostate cancer Father   . Hyperlipidemia Brother   . Appendicitis Maternal Grandfather    Social History:  reports that he quit smoking about 17 years ago. His smoking use included cigarettes. He has a 25.00 pack-year smoking history. He has never used smokeless tobacco.  He reports that he does not drink alcohol or use drugs.  Allergies:  Allergies  Allergen Reactions  . Sulfa Antibiotics     UNSPECIFIED REACTION OF CHILDHOOD  . Sulfamethoxazole     UNSPECIFIED REACTION OF CHILDHOOD  . Ambien [Zolpidem Tartrate] Anxiety         Medications Prior to Admission  Medication Sig Dispense Refill  . aspirin EC 81 MG tablet Take 81 mg by mouth daily.    . Cholecalciferol (VITAMIN D3 PO) Take 15,000 Units by mouth daily.     Marland Kitchen lamoTRIgine (LAMICTAL) 100 MG tablet Take 100-200 mg by mouth See admin instructions. 1 pill in AM 2 pills in PM    . levothyroxine (SYNTHROID, LEVOTHROID) 150 MCG tablet Take 150 mcg by mouth daily before breakfast.    . lovastatin (MEVACOR) 40 MG tablet Take 40 mg by mouth daily.     Marland Kitchen MAGNESIUM CITRATE PO Take 1-2 tablets by mouth 2 (two) times daily. 400 mg, 2 tabs in the morning, 1 tab at bedtime    . Omega-3 Fatty Acids (SUPER OMEGA 3 PO)  Take 1 capsule by mouth 2 (two) times daily.     Marland Kitchen omeprazole (PRILOSEC) 20 MG capsule Take 20 mg by mouth daily.    Marland Kitchen OVER THE COUNTER MEDICATION Place 1 Squirt under the tongue at bedtime. Hemp oil  500 mg    . pramipexole (MIRAPEX) 0.25 MG tablet Take 0.25 mg by mouth 2 (two) times daily.    . QUEtiapine (SEROQUEL) 400 MG tablet Take 400 mg by mouth at bedtime.     . risperiDONE (RISPERDAL) 1 MG tablet Take 0.5-1 mg by mouth See admin instructions. 0.5 mg in the morning, 1 mg at bedtime    . selegiline (ELDEPRYL) 5 MG tablet Take 10 mg by mouth 2 (two) times daily with a meal. AM dose and second dose 4 hours later     . vitamin B-12 (CYANOCOBALAMIN) 1000 MCG tablet Take 1,000 mcg by mouth daily.      No results found for this or any previous visit (from the past 48 hour(s)). No results found.  Review of Systems  Constitutional: Negative.   HENT: Negative.   Eyes: Negative.   Gastrointestinal: Positive for abdominal pain.  Skin: Negative.   All other systems reviewed and are negative.   Blood pressure (!) 141/83, temperature 98.1 F (36.7 C), temperature source Oral, resp. rate 18, SpO2 96 %. Physical Exam  Vitals reviewed. Constitutional: He is oriented to person, place, and time. He appears well-developed.  Obese  HENT:  Head: Normocephalic and atraumatic.  Eyes: Pupils are equal, round, and reactive to light. Conjunctivae and EOM are normal.  Cardiovascular: Normal rate, normal heart sounds and intact distal pulses.  Respiratory: Effort normal and breath sounds normal.  GI: Soft. Normal appearance and bowel sounds are normal. There is no tenderness. There is no rigidity, no rebound and no guarding.    Neurological: He is alert and oriented to person, place, and time. He has normal reflexes.  Skin: Skin is warm.  Psychiatric: He has a normal mood and affect. His behavior is normal. Judgment and thought content normal.     Assessment/Plan Umbilical hernia for  repair. Will try to repair without mesh.  Judeth Horn, MD 08/18/2018, 7:04 AM   Notes from prior office visit:  Reginald Tucker Documented: 07/01/2018 10:43 AM Location: [facility] Patient #: [id number] DOB: July 14, 1956 Married / Language: Cleophus Molt / Race: White Male  History of Present Illness Reginald Tucker. Hulen Skains MD; 07/01/2018 11:13 AM) Patient words: Umbilical hernia.  The patient is a 62 year old male who presents with an umbilical hernia. Symptoms include bulge at the umbilicus and abdominal pain. The pain is located in the mid-abdomen. The patient describes the pain as dull.   Allergies Sabino Gasser; 07/01/2018 10:55 AM) [name] *ANTI-INFECTIVE AGENTS - MISC.*  Ambien *HYPNOTICS/SEDATIVES/SLEEP DISORDER AGENTS*  Sulfa Antibiotics  Allergies Reconciled   Medication History Sabino Gasser; 07/01/2018 10:56 AM) RisperiDONE (1MG  Tablet, Oral) Active. Flurbiprofen (100MG  Tablet, Oral daily) Active. TraMADol HCl (50MG  Tablet, Oral as needed) Active. Chlorhexidine Gluconate (0.12% Solution, Mouth/Throat daily) Active. LORazepam (1MG  Tablet, Oral) Active. LamoTRIgine (100MG  Tablet, Oral) Active. Levothyroxine Sodium (150MCG Tablet, Oral) Active. Losartan Potassium-HCTZ (100-25MG  Tablet, Oral) Active. Omeprazole (20MG  Capsule DR, Oral) Active. QUEtiapine Fumarate (400MG  Tablet, Oral) Active. Selegiline HCl (5MG  Tablet, Oral) Active. Medications Reconciled  Vitals Sabino Gasser; 07/01/2018 10:57 AM) 07/01/2018 10:56 AM Weight: 216.13 lb Height: 72in Body Surface Area: 2.2 m Body Mass Index: 29.31 kg/m  Temp.: 97.46F(Oral)  Pulse: 127 (Regular)  BP: 106/62 (Sitting, Left Arm, Standard)  Physical Exam ([place] O. Hulen Skains MD; 07/01/2018 11:14 AM) General Mental Status-Alert. Orientation-Oriented X4. Build & Nutrition-Obese(BUt has lost 50 pounds. More overwieght than obese).  Chest and Lung Exam Chest and lung exam reveals -non-tender  and normal tactile fremitus and on auscultation, normal breath sounds, no adventitious sounds and normal vocal resonance.  Cardiovascular Auscultation Rhythm - Irregular(History of PVCs).  Abdomen Note: Reducible umbilical defect. Not tender. No incarcerated bowel.  Assessment & Plan Jeneen Rinks O. Mckynzi Cammon MD; 2/54/2706 23:76 AM) UMBILICAL HERNIA WITHOUT OBSTRUCTION AND WITHOUT GANGRENE (K42.9) Impression: 5 cm defect, will plan on open repair with mesh based on patient having recent BP problems Current Plans:  Umbilical hernia Prior infection of sebaceous cyst. [name]. Dahlia Bailiff, MD, [place] [phone] [facility]

## 2018-08-18 NOTE — Transfer of Care (Signed)
Immediate Anesthesia Transfer of Care Note  Patient: Reginald Tucker  Procedure(s) Performed: Umbilical Hernia Repair (N/A Abdomen)  Patient Location: PACU  Anesthesia Type:General  Level of Consciousness: awake, alert  and oriented  Airway & Oxygen Therapy: Patient Spontanous Breathing and Patient connected to face mask oxygen  Post-op Assessment: Report given to RN and Post -op Vital signs reviewed and stable  Post vital signs: Reviewed and stable  Last Vitals:  Vitals Value Taken Time  BP 132/90 08/18/2018  8:43 AM  Temp    Pulse 88 08/18/2018  8:44 AM  Resp 12 08/18/2018  8:44 AM  SpO2 100 % 08/18/2018  8:44 AM  Vitals shown include unvalidated device data.  Last Pain:  Vitals:   08/18/18 0628  TempSrc:   PainSc: 0-No pain      Patients Stated Pain Goal: 3 (20/91/98 0221)  Complications: No apparent anesthesia complications

## 2018-08-18 NOTE — Anesthesia Procedure Notes (Signed)
Procedure Name: Intubation Date/Time: 08/18/2018 7:32 AM Performed by: Marsa Aris, CRNA Pre-anesthesia Checklist: Patient identified, Emergency Drugs available, Suction available and Patient being monitored Patient Re-evaluated:Patient Re-evaluated prior to induction Oxygen Delivery Method: Circle System Utilized Preoxygenation: Pre-oxygenation with 100% oxygen Induction Type: IV induction Ventilation: Mask ventilation without difficulty Laryngoscope Size: Miller and 2 Grade View: Grade I Tube type: Oral Tube size: 7.5 mm Number of attempts: 1 Airway Equipment and Method: Stylet and Oral airway Placement Confirmation: ETT inserted through vocal cords under direct vision,  positive ETCO2 and breath sounds checked- equal and bilateral Secured at: 22 cm Tube secured with: Tape Dental Injury: Teeth and Oropharynx as per pre-operative assessment  Comments: No change in dentition from pre-procedure

## 2018-08-18 NOTE — Op Note (Signed)
OPERATIVE REPORT  DATE OF OPERATION: 08/18/2018  PATIENT:  Reginald Tucker  62 y.o. male  PRE-OPERATIVE DIAGNOSIS:  SYMPTOMATIC UMBILICA HERNIA  POST-OPERATIVE DIAGNOSIS:  SYMPTOMATIC UMBILICA HERNIA  INDICATION(S) FOR OPERATION:  Symptomatic umbilical hernia  FINDINGS:  4.0 cm umbilical hernia defect  PROCEDURE:  Procedure(s): Umbilical Hernia Repair  SURGEON:  Surgeon(s): Judeth Horn, MD  ASSISTANT: RNFA, [place]  ANESTHESIA:   local and general  COMPLICATIONS:  None  EBL: 10 ml  BLOOD ADMINISTERED: none  DRAINS: none   SPECIMEN:  No Specimen  COUNTS CORRECT:  YES  PROCEDURE DETAILS: The patient was taken to the operating room and placed on the table in the supine position.  After an adequate general endotracheal anesthetic was administered, he was prepped and draped in usual sterile manner exposing the umbilical area.  A proper timeout was performed identifying the patient and the procedure to be performed.  We marked the area of the incision lateral to the umbilicus and made a supraumbilical transverse incision using a #10 blade down to subcutaneous tissue.  We then dissected out the hernia sac which was intimately attached to the skin in the umbilical area.  We dissected down the sac down to the edges of the fascia and subsequently cut into and excised hernia sac.  Contained within the sac was attached omentum which had to be dissected free and small area resected but not sent as a specimen.  The patient had a 4 to 5 cm hernia defect which was repaired using a running horizontal mattress suture of #1 Novafil followed by a running simple stitch of the same suture.  Also interrupted figure-of-eight stitches of #1 Novafil were placed under direct vision of the intra-abdominal cavity.  Once these were placed we tied them down and then irrigated with saline.  The subcutaneous tissue was reapproximated using interrupted 3-0 Vicryl suture.  We made an attempt to invert the  umbilical skin using 4-0 Vicryl attaching it to the deep subcutaneous tissue.  We injected 17 mL of Exparel into the subcutaneous tissue and the subcuticular tissue.  The skin was closed using running subcuticular stitch of 4-0 Monocryl.  A 2 x 2 was placed in the umbilicus to keep an inverted and Dermabond Steri-Strips and Tegaderms were used to complete the dressings.  All needle counts, sponge counts, and instrument counts were correct.  PATIENT DISPOSITION:  PACU - hemodynamically stable.   Judeth Horn 9/9/20198:38 AM

## 2018-08-19 ENCOUNTER — Encounter (HOSPITAL_COMMUNITY): Payer: Self-pay | Admitting: General Surgery

## 2018-08-19 DIAGNOSIS — K429 Umbilical hernia without obstruction or gangrene: Secondary | ICD-10-CM | POA: Diagnosis not present

## 2018-08-19 MED ORDER — TRAMADOL HCL 50 MG PO TABS: 50.0000 mg | ORAL_TABLET | Freq: Four times a day (QID) | ORAL | 0 refills | Status: DC | PRN

## 2018-08-19 MED ORDER — OXYCODONE HCL 5 MG PO TABS: 5.0000 mg | ORAL_TABLET | ORAL | 0 refills | Status: DC | PRN

## 2018-08-19 NOTE — Progress Notes (Signed)
Discharge instructions reviewed with pt and pt's wife and instructed on where to pick up prescription.  Pt and wife verbalized understanding and questions answered.  Pt discharged in stable condition via wheelchair with wife.  Reginald Tucker [place]

## 2018-08-19 NOTE — Plan of Care (Signed)
  Problem: Education: Goal: Knowledge of General Education information will improve Description: Including pain rating scale, medication(s)/side effects and non-pharmacologic comfort measures Outcome: Progressing   Problem: Health Behavior/Discharge Planning: Goal: Ability to manage health-related needs will improve Outcome: Progressing   Problem: Nutrition: Goal: Adequate nutrition will be maintained Outcome: Progressing   Problem: Elimination: Goal: Will not experience complications related to urinary retention Outcome: Progressing   

## 2018-08-19 NOTE — Progress Notes (Signed)
[facility]/Reginald Tucker Progress Note 1 Day Post-Op  Subjective: Patient doing well without complaints.  Pain is well controlled.  Objective: Vital signs in last 24 hours: Temp:  [96.9 F (36.1 C)-97.7 F (36.5 C)] 97.4 F (36.3 C) (09/10 0536) Pulse Rate:  [62-89] 66 (09/10 0536) Resp:  [13-21] 16 (09/10 0536) BP: (94-132)/(60-91) 99/62 (09/10 0536) SpO2:  [92 %-100 %] 96 % (09/10 0536) Last BM Date: 08/17/18  Intake/Output from previous day: 09/09 0701 - 09/10 0700 In: 2873.5 [P.O.:1080; I.V.:1793.5] Out: 50 [Blood:50] Intake/Output this shift: No intake/output data recorded.  General: No acute distress  Lungs: Clear  Abd: Good bowel sounds.  Wound dressing is clean and dry.  No hernia  Extremities: No clinical signs or symptoms of DVT  Neuro: Intact  Lab Results:  @LABLAST2 (wbc:2,hgb:2,hct:2,plt:2) BMET )No results for input(s): NA, K, CL, CO2, GLUCOSE, BUN, CREATININE, CALCIUM in the last 72 hours. PT/INR No results for input(s): LABPROT, INR in the last 72 hours. ABG No results for input(s): PHART, HCO3 in the last 72 hours.  Invalid input(s): PCO2, PO2  Studies/Results: No results found.  Anti-infectives: Anti-infectives (From admission, onward)   Start     Dose/Rate Route Frequency Ordered Stop   08/18/18 1400  ceFAZolin (ANCEF) IVPB 2g/100 mL premix     2 g 200 mL/hr over 30 Minutes Intravenous Every 8 hours 08/18/18 1109 08/18/18 1502   08/18/18 0619  ceFAZolin (ANCEF) 2-4 GM/100ML-% IVPB    Note to Pharmacy:  Reginald Tucker   : cabinet override      08/18/18 0100 08/18/18 0734   08/18/18 0615  ceFAZolin (ANCEF) IVPB 2g/100 mL premix     2 g 200 mL/hr over 30 Minutes Intravenous On call to O.R. 08/18/18 0614 08/18/18 0739      Assessment/Plan: s/p Procedure(s): Umbilical Hernia Repair Discharge  LOS: 0 days   Reginald Tucker. Reginald Bailiff, MD, FACS [phone] [facility] 08/19/2018

## 2018-08-19 NOTE — Discharge Summary (Signed)
Physician Discharge Summary  Patient ID: Reginald Tucker MRN: [id number] DOB/AGE: 04-24-1956 62 y.o.  Admit date: 08/18/2018 Discharge date: 08/19/2018  Admission Diagnoses:  Symptomatic, large, incarcerated but not obstructed umbilical hernia  Discharge Diagnoses: Same Active Problems:   H/O umbilical hernia repair   Discharged Condition: good  Hospital Course: Admitted for concerns about sleep apnea postoperatively, to be safe post-anesthesia from open umbilical hernia repair without mesh  Consults: None  Significant Diagnostic Studies: None  Treatments: IV hydration, antibiotics: Ancef, analgesia: acetaminophen, Dilaudid and tramadol and surgery: Open umbilical hernia repair without mesh  Discharge Exam: Blood pressure 99/62, pulse 66, temperature (!) 97.4 F (36.3 C), temperature source Axillary, resp. rate 16, SpO2 96 %. General appearance: alert, cooperative, appears stated age and no distress Resp: clear to auscultation bilaterally GI: soft, non-tender; bowel sounds normal; no masses,  no organomegaly and surgical dressing is clean and dry  Disposition: Discharge disposition: 01-Home or Self Care       Discharge Instructions    Call MD for:  difficulty breathing, headache or visual disturbances   Complete by:  As directed    Call MD for:  extreme fatigue   Complete by:  As directed    Call MD for:  hives   Complete by:  As directed    Call MD for:  persistant dizziness or light-headedness   Complete by:  As directed    Call MD for:  persistant nausea and vomiting   Complete by:  As directed    Call MD for:  redness, tenderness, or signs of infection (pain, swelling, redness, odor or green/yellow discharge around incision site)   Complete by:  As directed    Call MD for:  severe uncontrolled pain   Complete by:  As directed    Call MD for:  temperature >100.4   Complete by:  As directed    Diet - low sodium heart healthy   Complete by:  As directed    Driving Restrictions   Complete by:  As directed    One week   Increase activity slowly   Complete by:  As directed    Leave dressing on - Keep it clean, dry, and intact until clinic visit   Complete by:  As directed    Remove after 10 days if not seen in clinic before then   Lifting restrictions   Complete by:  As directed    Nothing greater than 10 pounds for the next 6 weeks     Allergies as of 08/19/2018      Reactions   Sulfa Antibiotics Other (See Comments)   UNSPECIFIED REACTION OF CHILDHOOD   Sulfamethoxazole Other (See Comments)   UNSPECIFIED REACTION OF CHILDHOOD   Zolpidem Tartrate Anxiety   Nervous, uncontrollable      Medication List    TAKE these medications   aspirin EC 81 MG tablet Take 81 mg by mouth daily.   lamoTRIgine 100 MG tablet Commonly known as:  LAMICTAL Take 100-200 mg by mouth See admin instructions. 1 pill in AM 2 pills in PM   levothyroxine 150 MCG tablet Commonly known as:  SYNTHROID, LEVOTHROID Take 150 mcg by mouth daily before breakfast.   lovastatin 40 MG tablet Commonly known as:  MEVACOR Take 40 mg by mouth daily.   MAGNESIUM CITRATE PO Take 1-2 tablets by mouth 2 (two) times daily. 400 mg, 2 tabs in the morning, 1 tab at bedtime   omeprazole 20 MG capsule Commonly known as:  PRILOSEC Take 20 mg by mouth daily.   OVER THE COUNTER MEDICATION Place 1 Squirt under the tongue at bedtime. Hemp oil  500 mg   pramipexole 0.25 MG tablet Commonly known as:  MIRAPEX Take 0.25 mg by mouth 2 (two) times daily.   risperiDONE 1 MG tablet Commonly known as:  RISPERDAL Take 0.5-1 mg by mouth See admin instructions. 0.5 mg in the morning, 1 mg at bedtime   selegiline 5 MG tablet Commonly known as:  ELDEPRYL Take 10 mg by mouth 2 (two) times daily with a meal. AM dose and second dose 4 hours later   SEROQUEL 400 MG tablet Generic drug:  QUEtiapine Take 400 mg by mouth at bedtime.   SUPER OMEGA 3 PO Take 1 capsule by mouth 2 (two)  times daily.   traMADol 50 MG tablet Commonly known as:  ULTRAM Take 1 tablet (50 mg total) by mouth every 6 (six) hours as needed (mild pain).   vitamin B-12 1000 MCG tablet Commonly known as:  CYANOCOBALAMIN Take 1,000 mcg by mouth daily.   VITAMIN D3 PO Take 15,000 Units by mouth daily.      Follow-up Information    Judeth Horn, MD Follow up.   Specialty:  General Surgery Why:  Keep current appointment made Contact information: [address] [place] [address] [phone]           Signed: Vedh Ptacek 08/19/2018, 7:43 AM

## 2018-08-19 NOTE — Discharge Instructions (Signed)
Open Hernia Repair, Adult, Care After These instructions give you information about caring for yourself after your procedure. Your doctor may also give you more specific instructions. If you have problems or questions, contact your doctor. Follow these instructions at home: Surgical cut (incision) care   Follow instructions from your doctor about how to take care of your surgical cut area. Make sure you: ? Wash your hands with soap and water before you change your bandage (dressing). If you cannot use soap and water, use hand sanitizer. ? Change your bandage as told by your doctor. ? Leave stitches (sutures), skin glue, or skin tape (adhesive) strips in place. They may need to stay in place for 2 weeks or longer. If tape strips get loose and curl up, you may trim the loose edges. Do not remove tape strips completely unless your doctor says it is okay.  Check your surgical cut every day for signs of infection. Check for: ? More redness, swelling, or pain. ? More fluid or blood. ? Warmth. ? Pus or a bad smell. Activity  Do not drive or use heavy machinery while taking prescription pain medicine. Do not drive until your doctor says it is okay.  Until your doctor says it is okay: ? Do not lift anything that is heavier than 10 lb (4.5 kg). ? Do not play contact sports.  Return to your normal activities as told by your doctor. Ask your doctor what activities are safe. General instructions  To prevent or treat having a hard time pooping (constipation) while you are taking prescription pain medicine, your doctor may recommend that you: ? Drink enough fluid to keep your pee (urine) clear or pale yellow. ? Take over-the-counter or prescription medicines. ? Eat foods that are high in fiber, such as fresh fruits and vegetables, whole grains, and beans. ? Limit foods that are high in fat and processed sugars, such as fried and sweet foods.  Take over-the-counter and prescription medicines only as  told by your doctor.  Do not take baths, swim, or use a hot tub until your doctor says it is okay.  Leave dressing intact for at least 10 days if dry  Keep all follow-up visits as told by your doctor. This is important. Contact a doctor if:  You develop a rash.  You have more redness, swelling, or pain around your surgical cut.  You have more fluid or blood coming from your surgical cut.  Your surgical cut feels warm to the touch.  You have pus or a bad smell coming from your surgical cut.  You have a fever or chills.  You have blood in your poop (stool).  You have not pooped in 2-3 days.  Medicine does not help your pain. Get help right away if:  You have chest pain or you are short of breath.  You feel light-headed.  You feel weak and dizzy (feel faint).  You have very bad pain.  You throw up (vomit) and your pain is worse. This information is not intended to replace advice given to you by your health care provider. Make sure you discuss any questions you have with your health care provider. Document Released: 12/17/2014 Document Revised: 06/15/2016 Document Reviewed: 05/09/2016 Elsevier Interactive Patient Education  [employer].

## 2018-08-22 DIAGNOSIS — F3132 Bipolar disorder, current episode depressed, moderate: Secondary | ICD-10-CM | POA: Diagnosis not present

## 2018-09-05 ENCOUNTER — Encounter: Payer: Self-pay | Admitting: *Deleted

## 2018-09-08 ENCOUNTER — Telehealth: Payer: Self-pay

## 2018-09-08 DIAGNOSIS — R0681 Apnea, not elsewhere classified: Secondary | ICD-10-CM

## 2018-09-08 NOTE — Telephone Encounter (Signed)
Spoke with pt's wife this morning. Pt is absolutely not going to come in to the sleep lab to have a sleep study but is willing to do a HST. I would recommend the watchPat device for this patient. Are you okay with ordering HST?

## 2018-09-08 NOTE — Addendum Note (Signed)
Addended by: Lester [place] A on: 09/08/2018 04:38 PM   Modules accepted: Orders

## 2018-09-08 NOTE — Telephone Encounter (Signed)
VO for HST from Dr. Athar received. HST order placed.  

## 2018-09-08 NOTE — Telephone Encounter (Signed)
See telephone note I just posted for pt. Sorry, I forgot to attach your name to it.

## 2018-09-19 ENCOUNTER — Ambulatory Visit: Payer: PPO | Admitting: Psychiatry

## 2018-09-19 DIAGNOSIS — F411 Generalized anxiety disorder: Secondary | ICD-10-CM | POA: Diagnosis not present

## 2018-09-19 DIAGNOSIS — F3181 Bipolar II disorder: Secondary | ICD-10-CM

## 2018-09-19 DIAGNOSIS — F900 Attention-deficit hyperactivity disorder, predominantly inattentive type: Secondary | ICD-10-CM

## 2018-09-19 NOTE — Progress Notes (Signed)
[facility]  Patient ID: Reginald Tucker,  MRN: [id number]  PCP: [name], MD  Date of Evaluation: 09/19/2018 Time spent:30 minutes   HISTORY/CURRENT STATUS: HPI  CC  Chronic depression.  TRD.  Doing better.  More active.  In bed a lot per W.  Reading again, enjoys it.  He says he feels bad physically and that is the reason he does not want to go out.  He still quite depressed.  He still is easily anxious and irritable.  W concerned he doesn't want to be around people and is irritable.  Wife really emphasizes the irritability at home.  Extensive list of failed medications.  He also failed to respond significantly to ECT.  History of sertraline, Trintellix, Vraylar, buspirone, lithium, Depakote, risperidone, olanzapine, bupropion, Seroquel 800 mg a day, Latuda 120 mg a day, carbamazepine, Rexulti, paroxetine, perphenazine, modafinil, pramipexole augmentation and selegiline plus others  Individual Medical History/ Review of Systems: Changes? :Yes Toe pain from surgery.  Sleep study next week.  Allergies: Sulfa antibiotics; Sulfamethoxazole; and Zolpidem tartrate  Current Medications:  Current Outpatient Medications:  .  aspirin EC 81 MG tablet, Take 81 mg by mouth daily., Disp: , Rfl:  .  Cholecalciferol (VITAMIN D3 PO), Take 15,000 Units by mouth daily. , Disp: , Rfl:  .  lamoTRIgine (LAMICTAL) 100 MG tablet, Take 100-200 mg by mouth See admin instructions. 1 pill in AM 2 pills in PM, Disp: , Rfl:  .  levothyroxine (SYNTHROID, LEVOTHROID) 150 MCG tablet, Take 150 mcg by mouth daily before breakfast., Disp: , Rfl:  .  lovastatin (MEVACOR) 40 MG tablet, Take 40 mg by mouth daily. , Disp: , Rfl:  .  MAGNESIUM CITRATE PO, Take 1-2 tablets by mouth 2 (two) times daily. 400 mg, 2 tabs in the morning, 1 tab at bedtime, Disp: , Rfl:  .  Omega-3 Fatty Acids (SUPER OMEGA 3 PO), Take 1 capsule by mouth 2 (two) times daily. , Disp: , Rfl:  .  pramipexole (MIRAPEX) 0.25 MG  tablet, Take 0.25 mg by mouth 2 (two) times daily., Disp: , Rfl:  .  QUEtiapine (SEROQUEL) 400 MG tablet, Take 400 mg by mouth at bedtime. , Disp: , Rfl:  .  selegiline (ELDEPRYL) 5 MG tablet, Take 10 mg by mouth 2 (two) times daily with a meal. AM dose and second dose 4 hours later , Disp: , Rfl:  .  vitamin B-12 (CYANOCOBALAMIN) 1000 MCG tablet, Take 1,000 mcg by mouth daily., Disp: , Rfl:  .  omeprazole (PRILOSEC) 20 MG capsule, Take 20 mg by mouth daily., Disp: , Rfl:  .  OVER THE COUNTER MEDICATION, Place 1 Squirt under the tongue at bedtime. Hemp oil  500 mg, Disp: , Rfl:  .  traMADol (ULTRAM) 50 MG tablet, Take 1 tablet (50 mg total) by mouth every 6 (six) hours as needed (mild pain). (Patient not taking: Reported on 09/19/2018), Disp: 20 tablet, Rfl: 0 Medication Side Effects: None; ? constipation  Family Medical/ Social History: Changes? Yes limited activity.  Hernia surgery recently successful.   MENTAL HEALTH EXAM:  There were no vitals taken for this visit.There is no height or weight on file to calculate BMI.  General Appearance: Casual  Eye Contact:  Fair  Speech:  Clear and Coherent  Volume:  Increased  Mood:  Anxious, Depressed and Irritable  Affect:  Appropriate, Depressed, Labile and irritable  Thought Process:  Goal Directed  Orientation:  Full (Time, Place, and Person)  Thought Content:  WDL   Suicidal Thoughts:  No  Homicidal Thoughts:  No  Memory:  Recent  Judgement:  Good  Insight:  Good  Psychomotor Activity:  Normal  Concentration:  Concentration: Good  Recall:  Good  Fund of Knowledge: Good  Language: Good  Akathisia:  No  AIMS (if indicated): not done  Assets:  Housing Intimacy  ADL's: poor function still but slightly better  Cognition: WNL  Prognosis:  Poor   Lamotrigine 3.9  In Jan at this dosage. DIAGNOSES:    ICD-10-CM   1. Bipolar II disorder ([place]) F31.81   2. Generalized anxiety disorder F41.1   3. Attention deficit hyperactivity disorder  (ADHD), predominantly inattentive type F90.0     RECOMMENDATIONS:  Long history of treatment resistant depression.  He is noticeably more irritable in the office off the risperidone.  There are not a lot of options that remain. Consider increase lamotrigine bc the level at this dose was low.  Yes after the sleep study.    Option Adderall..he's concerned about the possibility of anxiety.  His wife expressed concern over possible cardiac and behavioral side effects given that he is somewhat irritable now.  Currently he is somewhat more irritable off the risperidone but at the same time is more interested and motivated and productive.  Therefore we will not add Adderall nor restart risperidone at this time.  Option Risperdal prn for irritabilty or anxiety.  Again discussed MAO inhibitor restrictions and interactions and risks associated with selegiline.  Discussed the metabolism of selegiline which produces stimulants in the body.  Agree with sleep study. Follow-up 6-week Purnell Shoemaker, MD

## 2018-09-19 NOTE — Patient Instructions (Signed)
After sleep study, increase lamotrigine to 1/1/2 tables in the am and 2 at night for 2 weeks then 2 twice daily (or 200mg  twice daily).

## 2018-09-21 ENCOUNTER — Other Ambulatory Visit: Payer: Self-pay | Admitting: Psychiatry

## 2018-09-24 ENCOUNTER — Other Ambulatory Visit: Payer: Self-pay

## 2018-09-24 DIAGNOSIS — H16101 Unspecified superficial keratitis, right eye: Secondary | ICD-10-CM | POA: Diagnosis not present

## 2018-09-24 NOTE — Patient Outreach (Signed)
[facility]) Care Management  09/24/2018  Reginald Tucker Apr 30, 1956 [id number]   Referral Date: 09/24/18 Referral Source: HTA Referral Reason: Patient in coverage gap   Outreach Attempt:spoke with spouse [name].  She states that the patient is in the coverage gap for Eldepryl.  She states the cost recently was $193 and they have problems affording it and wondered of there was some type of assistance.  Discussed [facility] services.  She is agreeable to pharmacy only at this time.    She reports that patient has bipolar, hyperlipidemia, and hypothyroidism.  She denies patient needing any support and education on diseases at this time.      Plan: RN CM will refer to pharmacy and sign off case.     Jone Baseman, RN, MSN [facility] Care Management Care Management Coordinator Direct Line [phone] Toll Free: [phone]  Fax: [phone]

## 2018-09-25 ENCOUNTER — Telehealth: Payer: Self-pay | Admitting: Pharmacist

## 2018-09-25 NOTE — Patient Outreach (Signed)
[facility]) Care Management  09/25/2018  Reginald Tucker July 22, 1956 [id number]  Patient was called regarding medication assistance with Eldepryl.  Unfortunately, he did not answer the phone. HIPAA compliant message was left on the patient's voicemail.  Plan: Call patient back in 3-5 business days.

## 2018-09-29 ENCOUNTER — Ambulatory Visit (INDEPENDENT_AMBULATORY_CARE_PROVIDER_SITE_OTHER): Payer: PPO | Admitting: Neurology

## 2018-09-29 DIAGNOSIS — G4710 Hypersomnia, unspecified: Secondary | ICD-10-CM | POA: Diagnosis not present

## 2018-09-29 DIAGNOSIS — R0683 Snoring: Secondary | ICD-10-CM

## 2018-09-29 DIAGNOSIS — R0681 Apnea, not elsewhere classified: Secondary | ICD-10-CM

## 2018-09-30 ENCOUNTER — Other Ambulatory Visit: Payer: Self-pay | Admitting: Pharmacist

## 2018-09-30 NOTE — Patient Outreach (Signed)
[facility]) Care Management  09/30/2018  Reginald Tucker 12-02-1956 [id number]    [facility]) Care Management  [place]   09/30/2018  Reginald Tucker Jun 21, 1956 [id number]  Reason for referral: Medication Assistance  Referral source: Telephonic Nursing/HTA   Referral medication(s): Selegiline Current insurance:HTA  HPI:  GERD HTN, Bipolar disorder, hypothyroidsism, vitamin d deficiency, BPH and falls.  Spoke with patient and his wife via speaker phone. HIPAA identifiers were obtained.  [name] expressed concern paying for selegiline.  Objective: Allergies  Allergen Reactions  . Sulfa Antibiotics Other (See Comments)    UNSPECIFIED REACTION OF CHILDHOOD  . Sulfamethoxazole Other (See Comments)    UNSPECIFIED REACTION OF CHILDHOOD  . Zolpidem Tartrate Anxiety      Nervous, uncontrollable    Medications Reviewed Today    Reviewed by Elayne Guerin, [facility] (Pharmacist) on 09/30/18 at [place] List Status: <None>  Medication Order Taking? Sig Documenting Provider Last Dose Status Informant  aspirin EC 81 MG tablet [id number] Yes Take 81 mg by mouth daily. [provider] Taking Active Spouse/Significant Other  Cholecalciferol (VITAMIN D3 PO) [id number] Yes Take 15,000 Units by mouth daily.  [provider] Taking Active Spouse/Significant Other  lamoTRIgine (LAMICTAL) 100 MG tablet [id number] Yes Take 100-200 mg by mouth See admin instructions. 1 pill in AM 2 pills in PM [provider] Taking Active Spouse/Significant Other  levothyroxine (SYNTHROID, LEVOTHROID) 150 MCG tablet [id number] Yes Take 150 mcg by mouth daily before breakfast. [provider] Taking Active Spouse/Significant Other  lovastatin (MEVACOR) 40 MG tablet [id number] Yes Take 40 mg by mouth daily.  [provider] Taking Active Spouse/Significant Other  MAGNESIUM CITRATE PO [id number] Yes Take 1-2 tablets by mouth 2  (two) times daily. 400 mg, 2 tabs in the morning, 1 tab at bedtime [provider] Taking Active Spouse/Significant Other  Methylcobalamin 1 MG CHEW [id number] Yes Chew 1 tablet by mouth daily. [provider] Taking Active   ofloxacin (OCUFLOX) 0.3 % ophthalmic solution [id number] Yes Instill 1 drop into the right eye every 3 hours- [provider] Taking Active   Omega-3 Fatty Acids (SUPER OMEGA 3 PO) [id number] Yes Take 1 capsule by mouth 2 (two) times daily.  [provider] Taking Active Spouse/Significant Other  omeprazole (PRILOSEC) 20 MG capsule [id number] Yes Take 20 mg by mouth daily. [provider] Taking Active Spouse/Significant Other  OVER THE COUNTER MEDICATION [id number] Yes Place 1 Squirt under the tongue at bedtime. Hemp oil  500 mg [provider] Taking Active Spouse/Significant Other  pramipexole (MIRAPEX) 0.25 MG tablet [id number] Yes Take 0.25 mg by mouth 2 (two) times daily. [provider] Taking Active Spouse/Significant Other  QUEtiapine (SEROQUEL) 400 MG tablet [id number] Yes Take 400 mg by mouth at bedtime.  [provider] Taking Active Spouse/Significant Other  risperiDONE (RISPERDAL) 1 MG tablet [id number] Yes TAKE ONE-HALF TABLET BY MOUTH EVERY AM AND TAKE 1 TABLET BY MOUTH AT BEDTIME Cottle, [employer] MD Taking Active   selegiline (ELDEPRYL) 5 MG tablet [id number] Yes Take 10 mg by mouth 2 (two) times daily with a meal. AM dose and second dose 4 hours later  [provider] Taking Active Spouse/Significant Other          Assessment:  Drugs sorted by system:  Neurologic/Psychologic: Lamotrigine, Pramipexole, Quetiapine, Selegline,   Cardiovascular: Aspirin, lovastatin, Omega 3 fatty acids,  Gastrointestinal: Magnesium Citrate, Omeprazole,  Endocrine: Levothyroxine  Vitamins/Minerals/Supplements: Hemp Oil, Methylcolbalamin,  cholecalciferol  Miscellaneous: Ofloxacin Eye  Drops  Medication Review Findings:  . Potential Dose too high-vitamin D   Medication Assistance Findings:   Pharmacy has been filling Selegiline 5mg  #300 for a 30 day supply.  Patient is actually only taking 2 tablets twice daily--#120 for a 30 day supply.  Patient and wife say they will ask for the prescription to be rewritten.  Extra Help:   []  Already receiving Full Extra Help  []  Already receiving Partial Extra Help  []  Eligible based on reported income and assets  [x]  Not Eligible based on reported income and assets  Patient Assistance Programs: None for generic Selegiline--Zelpar can go through [employer] for Parkinson's Disease but [employer] is out of money for the year and the patient is not using it for Parkinsons.  [name] said they would look into getting a new prescription written so they would be able to get 120 filled for a 30 day supply and pay less when in the coverage gap.  Additional medication assistance options reviewed with patient as warranted:  Coupon and Discount pharmacy lists  Plan: Close Pharmacy Case.

## 2018-10-01 ENCOUNTER — Ambulatory Visit: Payer: Self-pay | Admitting: Pharmacist

## 2018-10-02 ENCOUNTER — Ambulatory Visit: Payer: Self-pay | Admitting: Pharmacist

## 2018-10-06 NOTE — Procedures (Signed)
[facility] [address], [place] [address] NAME:  Reginald Tucker                                                                DOB: 09/01/1956 MEDICAL RECORD NUMBER [id number]                                               DOS: 09/29/18  REFERRING PHYSICIAN: [name], MD STUDY PERFORMED: Home Sleep Test HISTORY: 62 year old man with a history of hypertension, bipolar disease, reflux disease, chronic kidney disease, arthritis, anxiety, COPD, BPH, balance problems, hypothyroidism, hyperlipidemia, vitamin D deficiency and obesity, who reports snoring and excessive daytime somnolence. His Epworth sleepiness score is 4 out of 24, BMI of 30.5.  STUDY RESULTS: Total Recording Time: 7 hours, 49 minutes (valid test time: 6 hours, 9 min) Total Apnea/Hypopnea Index (AHI):  3.7/h, RDI:  5.2/h Average Oxygen Saturation:  92%, Lowest Oxygen Desaturation:  83%  Total Time Oxygen Saturation Below or at 88%:  0.1 minutes  Average Heart Rate:   72 bpm (between 43 and 115 bpm) IMPRESSION: Snoring RECOMMENDATION: This study does not demonstrate any significant obstructive or central sleep disordered breathing. Some snoring was noted. Treatment with positive airway pressure is not warranted. Weight loss and avoidance of the supine sleep position will likely reduce snoring. For disturbing snoring, an oral appliance (through a qualified dentist) can be considered. Other causes of the patient's symptoms, including circadian rhythm disturbances, an underlying mood disorder, medication effect and/or an underlying medical problem cannot be ruled out based on this test. Clinical correlation is recommended. The patient should be cautioned not to drive, work at heights, or operate dangerous or heavy equipment when tired or sleepy. Review and reiteration of good sleep hygiene measures should be pursued with any patient. The patient can follow up with his referring provider, who will be  notified of the test results.  I certify that I have reviewed the raw data recording prior to the issuance of this report in accordance with the standards of the American Academy of Sleep Medicine (AASM).  Star Age, MD, PhD [facility]) Diplomat, ABPN (Neurology and Sleep)

## 2018-10-06 NOTE — Progress Notes (Signed)
Patient referred by Dr. Jaynee Eagles, seen by me on 04/02/18, HST on 09/29/18.   Please call and notify the patient that the recent home sleep test did not show any significant obstructive sleep apnea. Some snoring was noted. Treatment with positive airway pressure is not warranted. Weight loss and avoidance of the supine sleep position will likely reduce snoring. For disturbing snoring, an oral appliance (through a qualified dentist) can be considered. Patient can follow up with the referring provider.   Thanks,  Star Age, MD, PhD [facility])

## 2018-10-07 ENCOUNTER — Telehealth: Payer: Self-pay

## 2018-10-07 NOTE — Telephone Encounter (Signed)
-----   Message from Star Age, MD sent at 10/06/2018  3:51 PM EDT ----- Patient referred by Dr. Jaynee Eagles, seen by me on 04/02/18, HST on 09/29/18.   Please call and notify the patient that the recent home sleep test did not show any significant obstructive sleep apnea. Some snoring was noted. Treatment with positive airway pressure is not warranted. Weight loss and avoidance of the supine sleep position will likely reduce snoring. For disturbing snoring, an oral appliance (through a qualified dentist) can be considered. Patient can follow up with the referring provider.   Thanks,  Star Age, MD, PhD [facility])

## 2018-10-07 NOTE — Telephone Encounter (Signed)
I called pt, spoke with pt's wife [name], per [facility] and advised her of pt's sleep study results. Pt had some snoring noted during the sleep study and if this snoring is disturbing to the pt, Dr. Rexene Alberts recommends that pt consider an oral appliance to treat snoring. The oral appliance should be made by a qualified dentist. Pt will follow up with Dr. Jaynee Eagles as planned. Pt's wife verbalized understanding of sleep study results and had no questions at this time.

## 2018-10-26 ENCOUNTER — Encounter: Payer: Self-pay | Admitting: Emergency Medicine

## 2018-10-26 DIAGNOSIS — F988 Other specified behavioral and emotional disorders with onset usually occurring in childhood and adolescence: Secondary | ICD-10-CM

## 2018-10-26 DIAGNOSIS — F411 Generalized anxiety disorder: Secondary | ICD-10-CM

## 2018-10-31 ENCOUNTER — Other Ambulatory Visit: Payer: Self-pay

## 2018-10-31 ENCOUNTER — Other Ambulatory Visit: Payer: Self-pay | Admitting: Psychiatry

## 2018-10-31 MED ORDER — PRAMIPEXOLE DIHYDROCHLORIDE 0.25 MG PO TABS: 0.2500 mg | ORAL_TABLET | Freq: Two times a day (BID) | ORAL | 1 refills | Status: DC

## 2018-11-03 ENCOUNTER — Other Ambulatory Visit: Payer: Self-pay | Admitting: Psychiatry

## 2018-11-03 NOTE — Telephone Encounter (Signed)
Need to confirm dose of seroquel in paper chart

## 2018-11-11 ENCOUNTER — Ambulatory Visit: Payer: PPO | Admitting: Psychiatry

## 2018-11-13 ENCOUNTER — Ambulatory Visit: Payer: PPO | Admitting: Psychiatry

## 2018-11-13 ENCOUNTER — Encounter: Payer: Self-pay | Admitting: Psychiatry

## 2018-11-13 DIAGNOSIS — F3162 Bipolar disorder, current episode mixed, moderate: Secondary | ICD-10-CM

## 2018-11-13 DIAGNOSIS — F411 Generalized anxiety disorder: Secondary | ICD-10-CM | POA: Diagnosis not present

## 2018-11-13 NOTE — Patient Instructions (Signed)
Increase quetiapine to 600mg  nightly for sleep and mood  Retry lightbox 30-60 mins daily morning to midday.

## 2018-11-13 NOTE — Progress Notes (Signed)
Reginald Tucker [id number] [birth year] 62 y.o.  Subjective:   Patient ID:  Reginald Tucker is a 62 y.o. (DOB 1956-03-20) male.  Chief Complaint:  Chief Complaint  Patient presents with  . Depression  . Fatigue  . Follow-up    med change    HPI Reginald Tucker presents to the office today for follow-up of TRD and anxiety.  Sleep study negative for OSA noted on chart. Stopped and restarted Risperidone bc anxiety got worse off of it. About a month.   Was worrying more and it helps.  No effect on depression. W says he seems pretty miserable.   She thinks he makes poor decisions.  Won't go celebrate birthday with her family.   He's irritable with wife today in the office.  He says he nags.  In the last week or so he's had more trouble going to sleep.  Initial and EMA.  Racing thoughts.  Probably more irritable. Moderate depression about like usual.  Low motivation and some activity.  Goes to stores and does some chores.  Reading a lot.  Loves history.  Never tried the Adderall bc they were afraid of the possible SE of mood swings.    He has had multiple psych med failures as well as ECT. Review of Systems:  Review of Systems  Neurological: Negative for tremors and weakness.  Psychiatric/Behavioral: Positive for agitation, behavioral problems, dysphoric mood and sleep disturbance. Negative for confusion, decreased concentration, hallucinations, self-injury and suicidal ideas. The patient is nervous/anxious. The patient is not hyperactive.   Occurs 2-3 times/weekre: leg weakness.  Not orthostatic.  Medications: I have reviewed the patient's current medications.  Current Outpatient Medications  Medication Sig Dispense Refill  . aspirin EC 81 MG tablet Take 81 mg by mouth daily.    . Cholecalciferol (VITAMIN D3 PO) Take 15,000 Units by mouth daily.     Marland Kitchen lamoTRIgine (LAMICTAL) 100 MG tablet Take 100-200 mg by mouth See admin instructions. 1 pill in AM 2 pills in PM    . levothyroxine  (SYNTHROID, LEVOTHROID) 150 MCG tablet Take 150 mcg by mouth daily before breakfast.    . lovastatin (MEVACOR) 40 MG tablet Take 40 mg by mouth daily.     Marland Kitchen MAGNESIUM CITRATE PO Take 1-2 tablets by mouth 2 (two) times daily. 400 mg, 2 tabs in the morning, 1 tab at bedtime    . Methylcobalamin 1 MG CHEW Chew 1 tablet by mouth daily.    Marland Kitchen ofloxacin (OCUFLOX) 0.3 % ophthalmic solution Instill 1 drop into the right eye every 3 hours-  0  . Omega-3 Fatty Acids (SUPER OMEGA 3 PO) Take 1 capsule by mouth 2 (two) times daily.     Marland Kitchen omeprazole (PRILOSEC) 20 MG capsule Take 20 mg by mouth daily.    Marland Kitchen OVER THE COUNTER MEDICATION Place 1 Squirt under the tongue at bedtime. Hemp oil  500 mg    . pramipexole (MIRAPEX) 0.25 MG tablet Take 0.25 mg by mouth 2 (two) times daily.    . QUEtiapine (SEROQUEL) 400 MG tablet TAKE 2 TABLET BY MOUTH AT BEDTIME (Patient taking differently: Take 400 mg by mouth at bedtime. ) 180 tablet 0  . risperiDONE (RISPERDAL) 1 MG tablet TAKE ONE-HALF TABLET BY MOUTH EVERY AM AND TAKE 1 TABLET BY MOUTH AT BEDTIME (Patient taking differently: 1 mg 2 (two) times daily. ) 135 tablet 0  . selegiline (ELDEPRYL) 5 MG tablet Take 10 mg by mouth 2 (two) times daily with a  meal. AM dose and second dose 4 hours later      No current facility-administered medications for this visit.     Medication Side Effects: None  Allergies:  Allergies  Allergen Reactions  . Sulfa Antibiotics Other (See Comments)    UNSPECIFIED REACTION OF CHILDHOOD  . Sulfamethoxazole Other (See Comments)    UNSPECIFIED REACTION OF CHILDHOOD  . Zolpidem Tartrate Anxiety      Nervous, uncontrollable    Past Medical History:  Diagnosis Date  . Acquired hallux rigidus of right foot 04/26/2017  . AKI (acute kidney injury) ([place]) 06/11/2017  . Anxiety   . Bipolar 1 disorder ([place])   . BMI 37.0-37.9, adult   . BPH (benign prostatic hyperplasia)   . Cataract   . Cataract    L eye  . CKD (chronic kidney disease),  stage III ([place])   . Colon polyps   . COPD GOLD II with restrictive component  01/13/2016   Spirometry 01/13/2016  FEV1 1.84 (47%)  Ratio 62  - 01/13/2016  extensive coaching HFA effectiveness =    90% > try stiolto respimat 2 pffs each am > did not benefit so stopped when sample out - 01/13/2016  Walked RA x 3 laps @ 185 ft each stopped due to  End of study, nl pace, no desat  / min sob  - PFT's  03/16/2016  FEV1 2.28 (59 % ) ratio 67  p 12 % improvement from saba p no prior to study with DLCO  66 % corrects to 86 % for alv volume     . Depression   . Dysrhythmia   . Essential hypertension 01/19/2015  . Family history of coronary arteriosclerosis 01/19/2015   Father with MI   . Fatty liver   . GERD (gastroesophageal reflux disease) 08/11/2014  . History of colon polyps 06/18/2017  . Hyperlipidemia   . Hypertension   . Hypothyroidism 08/11/2014  . Hypothyroidism   . Insomnia 06/18/2017  . Insomnia   . Memory change   . Mixed hyperlipidemia 06/18/2017  . Morbid obesity ([place]) 02/09/1223   Complicated by HBP/ Low erv on pfts 03/16/2016 (31%)    . Morbid obesity due to excess calories ([place])   . Prediabetes   . Sleep apnea   . SOB (shortness of breath)   . Thyroid disease   . Tinnitus of both ears 06/18/2017  . Tobacco use 06/18/2017  . Unsteadiness on feet   . Vitamin D deficiency 06/18/2017  . Vitamin D deficiency     Family History  Problem Relation Age of Onset  . Cancer Mother   . Hyperlipidemia Father   . Hypertension Father   . CAD Father   . Stroke Father   . Aortic aneurysm Father   . Prostate cancer Father   . Hyperlipidemia Brother   . Appendicitis Maternal Grandfather     Social History   Socioeconomic History  . Marital status: Married    Spouse name: Not on file  . Number of children: Not on file  . Years of education: Not on file  . Highest education level: Associate degree: occupational, Hotel manager, or vocational program  Occupational History  . Occupation: act.  assist   Social Needs  . Financial resource strain: Not on file  . Food insecurity:    Worry: Not on file    Inability: Not on file  . Transportation needs:    Medical: Not on file    Non-medical: Not on file  Tobacco Use  .  Smoking status: Former Smoker    Packs/day: 1.00    Years: 25.00    Pack years: 25.00    Types: Cigarettes    Last attempt to quit: 12/10/2000    Years since quitting: 17.9  . Smokeless tobacco: Never Used  . Tobacco comment: heavy vape user- quit vaping 06/04/2017  Substance and Sexual Activity  . Alcohol use: No    Alcohol/week: 0.0 standard drinks  . Drug use: No  . Sexual activity: Not on file  Lifestyle  . Physical activity:    Days per week: Not on file    Minutes per session: Not on file  . Stress: Not on file  Relationships  . Social connections:    Talks on phone: Not on file    Gets together: Not on file    Attends religious service: Not on file    Active member of club or organization: Not on file    Attends meetings of clubs or organizations: Not on file    Relationship status: Not on file  . Intimate partner violence:    Fear of current or ex partner: Not on file    Emotionally abused: Not on file    Physically abused: Not on file    Forced sexual activity: Not on file  Other Topics Concern  . Not on file  Social History Narrative   Admitted to [facility] 06/14/17- discharged   Lives at home with his wife   Married - [name]   Former smoker - stopped 2002   Alcohol none   Full code   Right handed   Drinks 2 cups of caffeine daily    Past Medical History, Surgical history, Social history, and Family history were reviewed and updated as appropriate.   Please see review of systems for further details on the patient's review from today.   Objective:   Physical Exam:  There were no vitals taken for this visit.  Physical Exam  Constitutional: He is oriented to person, place, and time. He appears well-developed. No distress.   Musculoskeletal: He exhibits no deformity.  Neurological: He is alert and oriented to person, place, and time. He displays no tremor. Coordination and gait normal.  Psychiatric: His speech is normal. Thought content normal. His mood appears anxious. His affect is angry. His affect is not blunt, not labile and not inappropriate. He is agitated and hyperactive. Thought content is not paranoid. Cognition and memory are normal. He exhibits a depressed mood. He expresses no homicidal and no suicidal ideation. He expresses no suicidal plans and no homicidal plans.  Insight and judgment fair-poor. No auditory or visual hallucinations. No delusions.  Markedly more irritable than usual.    Lab Review:     Component Value Date/Time   NA 140 08/13/2018 1020   K 4.3 08/13/2018 1020   CL 107 08/13/2018 1020   CO2 26 08/13/2018 1020   GLUCOSE 99 08/13/2018 1020   BUN 13 08/13/2018 1020   CREATININE 1.14 08/13/2018 1020   CALCIUM 9.4 08/13/2018 1020   PROT 6.9 02/11/2018 0623   ALBUMIN 4.2 02/11/2018 0623   AST 25 02/11/2018 0623   ALT 33 02/11/2018 0623   ALKPHOS 89 02/11/2018 0623   BILITOT 0.9 02/11/2018 0623   GFRNONAA >60 08/13/2018 1020   GFRAA >60 08/13/2018 1020       Component Value Date/Time   WBC 6.8 08/13/2018 1020   RBC 5.29 08/13/2018 1020   HGB 16.5 08/13/2018 1020   HCT 50.5 08/13/2018  1020   PLT 192 08/13/2018 1020   MCV 95.5 08/13/2018 1020   MCH 31.2 08/13/2018 1020   MCHC 32.7 08/13/2018 1020   RDW 12.0 08/13/2018 1020   LYMPHSABS 1.3 08/13/2018 1020   MONOABS 0.7 08/13/2018 1020   EOSABS 0.1 08/13/2018 1020   BASOSABS 0.1 08/13/2018 1020    No results found for: POCLITH, LITHIUM   No results found for: PHENYTOIN, PHENOBARB, VALPROATE, CBMZ   .res Assessment: Plan:    Bipolar 1 disorder, mixed, moderate (HCC)  Generalized anxiety disorder   Severe TRD and anxiety.  Prominent mixed sx more noticeable today.  He is much more irritable than usual.   Increase quetiapine to 600mg  HS to help depresssion and irritability and mood.   Discussed potential metabolic side effects associated with atypical antipsychotics, as well as potential risk for movement side effects. Advised pt to contact office if movement side effects occur.   Disc light box and duration and timing in depth.  He has a light box and he is willing to pull it out and start using it again even though he did not think it helped before.  There is some concern that he was not using it appropriately before.  He was instructed in detail as to how to use at this time.  Consider retry increase in pramipexole to help with mood.  No change in that today.  This appt was 30 mins.  FU 6 weeks.  Lynder Parents, MD, DFAPA  Please see After Visit Summary for patient specific instructions.  Future Appointments  Date Time Provider [place]  01/02/2019 11:30 AM Cottle, Billey Co., MD [facility] None    No orders of the defined types were placed in this encounter.     -------------------------------

## 2018-12-12 DIAGNOSIS — H25811 Combined forms of age-related cataract, right eye: Secondary | ICD-10-CM | POA: Diagnosis not present

## 2018-12-12 DIAGNOSIS — Z01818 Encounter for other preprocedural examination: Secondary | ICD-10-CM | POA: Diagnosis not present

## 2018-12-12 DIAGNOSIS — L719 Rosacea, unspecified: Secondary | ICD-10-CM | POA: Diagnosis not present

## 2018-12-12 DIAGNOSIS — H25812 Combined forms of age-related cataract, left eye: Secondary | ICD-10-CM | POA: Diagnosis not present

## 2018-12-22 ENCOUNTER — Other Ambulatory Visit: Payer: Self-pay

## 2018-12-22 MED ORDER — LAMOTRIGINE 100 MG PO TABS: ORAL_TABLET | ORAL | 0 refills | Status: DC

## 2018-12-23 ENCOUNTER — Telehealth: Payer: Self-pay | Admitting: Psychiatry

## 2018-12-24 ENCOUNTER — Telehealth: Payer: Self-pay | Admitting: Psychiatry

## 2018-12-24 NOTE — Telephone Encounter (Signed)
ERROR

## 2018-12-29 ENCOUNTER — Other Ambulatory Visit: Payer: Self-pay | Admitting: Psychiatry

## 2018-12-30 ENCOUNTER — Other Ambulatory Visit: Payer: Self-pay | Admitting: Psychiatry

## 2019-01-01 DIAGNOSIS — Z961 Presence of intraocular lens: Secondary | ICD-10-CM | POA: Diagnosis not present

## 2019-01-01 DIAGNOSIS — H2511 Age-related nuclear cataract, right eye: Secondary | ICD-10-CM | POA: Diagnosis not present

## 2019-01-01 DIAGNOSIS — H52223 Regular astigmatism, bilateral: Secondary | ICD-10-CM | POA: Diagnosis not present

## 2019-01-01 DIAGNOSIS — H524 Presbyopia: Secondary | ICD-10-CM | POA: Diagnosis not present

## 2019-01-01 DIAGNOSIS — H25811 Combined forms of age-related cataract, right eye: Secondary | ICD-10-CM | POA: Diagnosis not present

## 2019-01-01 DIAGNOSIS — Z9841 Cataract extraction status, right eye: Secondary | ICD-10-CM | POA: Diagnosis not present

## 2019-01-01 DIAGNOSIS — H5203 Hypermetropia, bilateral: Secondary | ICD-10-CM | POA: Diagnosis not present

## 2019-01-02 ENCOUNTER — Ambulatory Visit: Payer: PPO | Admitting: Psychiatry

## 2019-01-02 ENCOUNTER — Encounter: Payer: Self-pay | Admitting: Psychiatry

## 2019-01-02 DIAGNOSIS — F411 Generalized anxiety disorder: Secondary | ICD-10-CM | POA: Diagnosis not present

## 2019-01-02 DIAGNOSIS — F3162 Bipolar disorder, current episode mixed, moderate: Secondary | ICD-10-CM

## 2019-01-02 MED ORDER — RISPERIDONE 1 MG PO TABS: 1.0000 mg | ORAL_TABLET | Freq: Two times a day (BID) | ORAL | 0 refills | Status: DC

## 2019-01-02 MED ORDER — PRAMIPEXOLE DIHYDROCHLORIDE 0.25 MG PO TABS: 0.5000 mg | ORAL_TABLET | Freq: Two times a day (BID) | ORAL | 0 refills | Status: DC

## 2019-01-02 MED ORDER — QUETIAPINE FUMARATE 400 MG PO TABS: 600.0000 mg | ORAL_TABLET | Freq: Every day | ORAL | 0 refills | Status: DC

## 2019-01-02 NOTE — Patient Instructions (Signed)
Increase pramipexole to 2 twice daily for depression which equals 0.5 mg twice daily.

## 2019-01-02 NOTE — Progress Notes (Signed)
Reginald Tucker [id number] [birth year] 63 y.o.  Subjective:   Patient ID:  Reginald Tucker is a 63 y.o. (DOB 1956/12/03) male.  Chief Complaint:  Chief Complaint  Patient presents with  . Follow-up    Medication Management    HPI last seen November 13, 2018  With wife. Reginald Tucker presents to the office today for follow-up of TRD and anxiety.  At the last visit we increased the Seroquel back to 800 mg a day in hopes of helping mixed symptoms.No changes in mood.  Still depressed and irritable.  No SE.  It's helped sleep.  Anxiety is about the same.  W said he wouldn't cooperate with Xmas and isolated.  Chronic depression makes him miserable. Racing thoughts better.   Moderate depression about like usual.  Low motivation and some activity.  Goes to stores and does some chores.  Reading a lot.  Loves history. She thinks he makes poor decisions.  Can't let things go.  Overreacts.  Used light box for 3 weeks and stopped bc didn't see a difference.  Used 1 hour daily with proper use.  Long term problems in Jan and Feb.  Seasonality.  Sleep study negative for OSA noted on chart.  Stopped and restarted Risperidone bc anxiety got worse off of it. About a month.   Was worrying more and it helps.  No effect on depression. W says he seems pretty miserable.   Won't go celebrate birthday with her family.    Never tried the Adderall bc they were afraid of the possible SE of mood swings.    Past Psychiatric Medication Trials: He has had multiple psych med failures as well .  Past psychiatric medications used include pramipexole, sertraline, Trintellix, Vraylar, lamotrigine, buspirone, lithium with a tremor, Depakote was side effects of feeling heavy and increased ammonia, risperidone, olanzapine, Wellbutrin, Seroquel 800 mg a day, Latuda 120 mg a day, carbamazepine, Rexulti, Paxil, perphenazine, modafinil, selegiline max 30mg  daily.  Review of Systems:  Review of Systems  Neurological: Negative for  tremors and weakness.  Psychiatric/Behavioral: Positive for agitation, behavioral problems, dysphoric mood and sleep disturbance. Negative for confusion, decreased concentration, hallucinations, self-injury and suicidal ideas. The patient is nervous/anxious. The patient is not hyperactive.   Occurs 2-3 times/weekre: leg weakness.  Not orthostatic.  Medications: I have reviewed the patient's current medications.  Current Outpatient Medications  Medication Sig Dispense Refill  . aspirin EC 81 MG tablet Take 81 mg by mouth daily.    . Cholecalciferol (VITAMIN D3 PO) Take 15,000 Units by mouth daily.     Marland Kitchen L-THEANINE PO Take by mouth.    . lamoTRIgine (LAMICTAL) 100 MG tablet Take 1 tablet in the morning and 2 tablets every pm 270 tablet 0  . levothyroxine (SYNTHROID, LEVOTHROID) 150 MCG tablet Take 150 mcg by mouth daily before breakfast.    . lovastatin (MEVACOR) 40 MG tablet Take 40 mg by mouth daily.     Marland Kitchen MAGNESIUM CITRATE PO Take 1-2 tablets by mouth 2 (two) times daily. 400 mg, 2 tabs in the morning, 1 tab at bedtime    . Methylcobalamin 1 MG CHEW Chew 1 tablet by mouth daily.    Marland Kitchen ofloxacin (OCUFLOX) 0.3 % ophthalmic solution Instill 1 drop into the right eye every 3 hours-  0  . Omega-3 Fatty Acids (SUPER OMEGA 3 PO) Take 1 capsule by mouth 2 (two) times daily.     Marland Kitchen omeprazole (PRILOSEC) 20 MG capsule Take 20 mg by mouth  daily.    Marland Kitchen OVER THE COUNTER MEDICATION Place 1 Squirt under the tongue at bedtime. Hemp oil  500 mg    . pramipexole (MIRAPEX) 0.25 MG tablet TAKE 1 TABLET(0.25 MG) BY MOUTH TWICE DAILY 180 tablet 0  . QUEtiapine (SEROQUEL) 400 MG tablet TAKE 2 TABLET BY MOUTH AT BEDTIME (Patient taking differently: Take 600 mg by mouth at bedtime. ) 180 tablet 0  . risperiDONE (RISPERDAL) 1 MG tablet TAKE ONE-HALF TABLET BY MOUTH EVERY AM AND TAKE 1 TABLET BY MOUTH AT BEDTIME (Patient taking differently: 1 mg 2 (two) times daily. ) 135 tablet 0  . selegiline (ELDEPRYL) 5 MG tablet Take  10 mg by mouth 2 (two) times daily with a meal. AM dose and second dose 4 hours later      No current facility-administered medications for this visit.     Medication Side Effects: None  Allergies:  Allergies  Allergen Reactions  . Sulfa Antibiotics Other (See Comments)    UNSPECIFIED REACTION OF CHILDHOOD  . Sulfamethoxazole Other (See Comments)    UNSPECIFIED REACTION OF CHILDHOOD  . Zolpidem Tartrate Anxiety      Nervous, uncontrollable    Past Medical History:  Diagnosis Date  . Acquired hallux rigidus of right foot 04/26/2017  . AKI (acute kidney injury) ([place]) 06/11/2017  . Anxiety   . Bipolar 1 disorder ([place])   . BMI 37.0-37.9, adult   . BPH (benign prostatic hyperplasia)   . Cataract   . Cataract    L eye  . CKD (chronic kidney disease), stage III ([place])   . Colon polyps   . COPD GOLD II with restrictive component  01/13/2016   Spirometry 01/13/2016  FEV1 1.84 (47%)  Ratio 62  - 01/13/2016  extensive coaching HFA effectiveness =    90% > try stiolto respimat 2 pffs each am > did not benefit so stopped when sample out - 01/13/2016  Walked RA x 3 laps @ 185 ft each stopped due to  End of study, nl pace, no desat  / min sob  - PFT's  03/16/2016  FEV1 2.28 (59 % ) ratio 67  p 12 % improvement from saba p no prior to study with DLCO  66 % corrects to 86 % for alv volume     . Depression   . Dysrhythmia   . Essential hypertension 01/19/2015  . Family history of coronary arteriosclerosis 01/19/2015   Father with MI   . Fatty liver   . GERD (gastroesophageal reflux disease) 08/11/2014  . History of colon polyps 06/18/2017  . Hyperlipidemia   . Hypertension   . Hypothyroidism 08/11/2014  . Hypothyroidism   . Insomnia 06/18/2017  . Insomnia   . Memory change   . Mixed hyperlipidemia 06/18/2017  . Morbid obesity ([place]) 05/12/1496   Complicated by HBP/ Low erv on pfts 03/16/2016 (31%)    . Morbid obesity due to excess calories ([place])   . Prediabetes   . Sleep apnea   . SOB (shortness of breath)    . Thyroid disease   . Tinnitus of both ears 06/18/2017  . Tobacco use 06/18/2017  . Unsteadiness on feet   . Vitamin D deficiency 06/18/2017  . Vitamin D deficiency     Family History  Problem Relation Age of Onset  . Cancer Mother   . Hyperlipidemia Father   . Hypertension Father   . CAD Father   . Stroke Father   . Aortic aneurysm Father   . Prostate  cancer Father   . Hyperlipidemia Brother   . Appendicitis Maternal Grandfather     Social History   Socioeconomic History  . Marital status: Married    Spouse name: Not on file  . Number of children: Not on file  . Years of education: Not on file  . Highest education level: Associate degree: occupational, Hotel manager, or vocational program  Occupational History  . Occupation: act.  assist  Social Needs  . Financial resource strain: Not on file  . Food insecurity:    Worry: Not on file    Inability: Not on file  . Transportation needs:    Medical: Not on file    Non-medical: Not on file  Tobacco Use  . Smoking status: Former Smoker    Packs/day: 1.00    Years: 25.00    Pack years: 25.00    Types: Cigarettes    Last attempt to quit: 12/10/2000    Years since quitting: 18.0  . Smokeless tobacco: Never Used  . Tobacco comment: heavy vape user- quit vaping 06/04/2017  Substance and Sexual Activity  . Alcohol use: No    Alcohol/week: 0.0 standard drinks  . Drug use: No  . Sexual activity: Not on file  Lifestyle  . Physical activity:    Days per week: Not on file    Minutes per session: Not on file  . Stress: Not on file  Relationships  . Social connections:    Talks on phone: Not on file    Gets together: Not on file    Attends religious service: Not on file    Active member of club or organization: Not on file    Attends meetings of clubs or organizations: Not on file    Relationship status: Not on file  . Intimate partner violence:    Fear of current or ex partner: Not on file    Emotionally abused: Not on  file    Physically abused: Not on file    Forced sexual activity: Not on file  Other Topics Concern  . Not on file  Social History Narrative   Admitted to [facility] 06/14/17- discharged   Lives at home with his wife   Married - [name]   Former smoker - stopped 2002   Alcohol none   Full code   Right handed   Drinks 2 cups of caffeine daily    Past Medical History, Surgical history, Social history, and Family history were reviewed and updated as appropriate.   Please see review of systems for further details on the patient's review from today.   Objective:   Physical Exam:  There were no vitals taken for this visit.  Physical Exam Constitutional:      General: He is not in acute distress.    Appearance: He is well-developed.  Musculoskeletal:        General: No deformity.  Neurological:     Mental Status: He is alert and oriented to person, place, and time.     Motor: No tremor.     Coordination: Coordination normal.     Gait: Gait normal.  Psychiatric:        Mood and Affect: Mood is anxious and depressed. Affect is angry. Affect is not labile, blunt or inappropriate.        Speech: Speech normal.        Behavior: Behavior is agitated and hyperactive.        Thought Content: Thought content normal. Thought content is not paranoid.  Thought content does not include homicidal or suicidal ideation. Thought content does not include homicidal or suicidal plan.     Comments: Insight and judgment fair-poor. No auditory or visual hallucinations. No delusions.  Markedly more irritable than usual.     Lab Review:     Component Value Date/Time   NA 140 08/13/2018 1020   K 4.3 08/13/2018 1020   CL 107 08/13/2018 1020   CO2 26 08/13/2018 1020   GLUCOSE 99 08/13/2018 1020   BUN 13 08/13/2018 1020   CREATININE 1.14 08/13/2018 1020   CALCIUM 9.4 08/13/2018 1020   PROT 6.9 02/11/2018 0623   ALBUMIN 4.2 02/11/2018 0623   AST 25 02/11/2018 0623   ALT 33 02/11/2018 0623    ALKPHOS 89 02/11/2018 0623   BILITOT 0.9 02/11/2018 0623   GFRNONAA >60 08/13/2018 1020   GFRAA >60 08/13/2018 1020       Component Value Date/Time   WBC 6.8 08/13/2018 1020   RBC 5.29 08/13/2018 1020   HGB 16.5 08/13/2018 1020   HCT 50.5 08/13/2018 1020   PLT 192 08/13/2018 1020   MCV 95.5 08/13/2018 1020   MCH 31.2 08/13/2018 1020   MCHC 32.7 08/13/2018 1020   RDW 12.0 08/13/2018 1020   LYMPHSABS 1.3 08/13/2018 1020   MONOABS 0.7 08/13/2018 1020   EOSABS 0.1 08/13/2018 1020   BASOSABS 0.1 08/13/2018 1020    No results found for: POCLITH, LITHIUM   No results found for: PHENYTOIN, PHENOBARB, VALPROATE, CBMZ   .res Assessment: Plan:    Bipolar 1 disorder, mixed, moderate (HCC)  Generalized anxiety disorder   Severe TRD and anxiety. Was good after surgery July to December and then relapsed.   Prominent mixed sx more noticeable today.  He is much more irritable than usual.  Increase quetiapine to 600mg  HS to help depresssion and irritability and mood.  CBT around depression and irritability.  Discussed potential metabolic side effects associated with atypical antipsychotics, as well as potential risk for movement side effects. Advised pt to contact office if movement side effects occur.   Light therapy failure.  Discussed chronic issues with compliance that have complicated treatments at times.  He has a tendency to change medications on his own if he gets frustrated.  This can sabotages recovery.    Consider retry increase in pramipexole to help with mood. He's taken 2mg  daily in the past with paxil but the combo with selegiline could be even more potent for DA effects. Explained the combination and how it's different. Disc risks mania and impulsivity.  Polypharmacy is a real potential problem but appears unavoidable at this time.  Particularly the combination of dopamine agonists and dopamine antagonists is not ideal but given that he does not respond to any of the  traditional mood stabilizers that are not atypicals this is the result.  Consider amanatadine for the same reason.  Other options [facility]  This appt was 30 mins.  FU 6 weeks.  Lynder Parents, MD, DFAPA  Please see After Visit Summary for patient specific instructions.  No future appointments.  No orders of the defined types were placed in this encounter.     -------------------------------

## 2019-01-05 ENCOUNTER — Other Ambulatory Visit: Payer: Self-pay

## 2019-01-05 MED ORDER — SELEGILINE HCL 5 MG PO TABS: 20.0000 mg | ORAL_TABLET | Freq: Two times a day (BID) | ORAL | 0 refills | Status: DC

## 2019-01-15 DIAGNOSIS — H25812 Combined forms of age-related cataract, left eye: Secondary | ICD-10-CM | POA: Diagnosis not present

## 2019-01-15 DIAGNOSIS — H2512 Age-related nuclear cataract, left eye: Secondary | ICD-10-CM | POA: Diagnosis not present

## 2019-01-29 ENCOUNTER — Other Ambulatory Visit: Payer: Self-pay | Admitting: Psychiatry

## 2019-01-30 ENCOUNTER — Ambulatory Visit: Payer: PPO | Admitting: Psychiatry

## 2019-01-30 ENCOUNTER — Encounter: Payer: Self-pay | Admitting: Psychiatry

## 2019-01-30 DIAGNOSIS — F411 Generalized anxiety disorder: Secondary | ICD-10-CM | POA: Diagnosis not present

## 2019-01-30 DIAGNOSIS — F3162 Bipolar disorder, current episode mixed, moderate: Secondary | ICD-10-CM

## 2019-01-30 DIAGNOSIS — F900 Attention-deficit hyperactivity disorder, predominantly inattentive type: Secondary | ICD-10-CM | POA: Diagnosis not present

## 2019-01-30 MED ORDER — PRAMIPEXOLE DIHYDROCHLORIDE 0.75 MG PO TABS: 0.7500 mg | ORAL_TABLET | Freq: Two times a day (BID) | ORAL | 0 refills | Status: DC

## 2019-01-30 NOTE — Patient Instructions (Addendum)
increase the pramipexole to 3 of 0.25 mg tablets twice daily or 0.75 mg twice daily when you get the new prescription.

## 2019-01-30 NOTE — Progress Notes (Signed)
Reginald Tucker [id number] 1956-07-14 63 y.o.  Subjective:   Patient ID:  Reginald Tucker is a 63 y.o. (DOB 1956/01/25) male.  Chief Complaint:  Chief Complaint  Patient presents with  . Follow-up    Medication Management    HPI last seen January 02, 2019 .  Alone today. Reginald Tucker presents to the office today for follow-up of TRD and anxiety.  In January we retried pramipexole 0.5 BID.  Seems like I'm getting a little better.  Not much but noticeable.  If I can survive winter I'll be OK.  Very dreary winter.  No SE. No real change in sleep pattern except to sleep quicker with increase in Seroquel to 600 HS.  Gets about 8 hours.  Not laying in bed a lot.  Sits in chair in the den and more active.  Maybe a little less irritable, but can be triggered.  Recognized he ranted over a dead battery about the same.  Anxiety is about the same.  No change in productivity does a little shopping and washes the car.  Tries to walk when he goes to stores.  Low motivation and some activity.  Reading a lot.  Loves history. She thinks he makes poor decisions.  Can't let things go.    Used light box for 3 weeks and stopped bc didn't see a difference.  Used 1 hour daily with proper use.  Long term problems in Jan and Feb.  Seasonality.  Sleep study negative for OSA noted on chart.  Stopped and restarted Risperidone bc anxiety got worse off of it. About a month.   Was worrying more and it helps.  No effect on depression. W says he seems pretty miserable.   Won't go celebrate birthday with her family.    Never tried the Adderall bc they were afraid of the possible SE of mood swings.    Past Psychiatric Medication Trials: He has had multiple psych med failures as well .  Past psychiatric medications used include pramipexole, sertraline, Trintellix, Vraylar, lamotrigine, buspirone, lithium with a tremor, Depakote was side effects of feeling heavy and increased ammonia, risperidone, olanzapine, Wellbutrin,  Seroquel 800 mg a day, Latuda 120 mg a day, carbamazepine, Rexulti, Paxil, perphenazine, modafinil, selegiline max 30mg  daily, Light therapy failure.  Review of Systems:  Review of Systems  Neurological: Negative for tremors and weakness.  Psychiatric/Behavioral: Positive for dysphoric mood. Negative for agitation, behavioral problems, confusion, decreased concentration, hallucinations, self-injury, sleep disturbance and suicidal ideas. The patient is nervous/anxious. The patient is not hyperactive.   Occurs 2-3 times/weekre: leg weakness.  Not orthostatic.  Medications: I have reviewed the patient's current medications.  Current Outpatient Medications  Medication Sig Dispense Refill  . aspirin EC 81 MG tablet Take 81 mg by mouth daily.    . Cholecalciferol (VITAMIN D3 PO) Take 15,000 Units by mouth daily.     Marland Kitchen L-THEANINE PO Take by mouth.    . lamoTRIgine (LAMICTAL) 100 MG tablet Take 1 tablet in the morning and 2 tablets every pm 270 tablet 0  . levothyroxine (SYNTHROID, LEVOTHROID) 150 MCG tablet Take 150 mcg by mouth daily before breakfast.    . lovastatin (MEVACOR) 40 MG tablet Take 40 mg by mouth daily.     Marland Kitchen MAGNESIUM CITRATE PO Take 1-2 tablets by mouth 2 (two) times daily. 400 mg, 2 tabs in the morning, 1 tab at bedtime    . Methylcobalamin 1 MG CHEW Chew 1 tablet by mouth daily.    Marland Kitchen  ofloxacin (OCUFLOX) 0.3 % ophthalmic solution Instill 1 drop into the right eye every 3 hours-  0  . Omega-3 Fatty Acids (SUPER OMEGA 3 PO) Take 1 capsule by mouth 2 (two) times daily.     Marland Kitchen omeprazole (PRILOSEC) 20 MG capsule Take 20 mg by mouth daily.    Marland Kitchen OVER THE COUNTER MEDICATION Place 1 Squirt under the tongue at bedtime. Hemp oil  500 mg    . QUEtiapine (SEROQUEL) 400 MG tablet Take 1.5 tablets (600 mg total) by mouth at bedtime. 135 tablet 0  . risperiDONE (RISPERDAL) 1 MG tablet Take 1 tablet (1 mg total) by mouth 2 (two) times daily. 180 tablet 0  . selegiline (ELDEPRYL) 5 MG tablet Take 4  tablets (20 mg total) by mouth 2 (two) times daily with a meal. AM dose and second dose 4 hours later 240 tablet 0  . pramipexole (MIRAPEX) 0.75 MG tablet Take 1 tablet (0.75 mg total) by mouth 2 (two) times daily. 180 tablet 0   No current facility-administered medications for this visit.     Medication Side Effects: None  Allergies:  Allergies  Allergen Reactions  . Sulfa Antibiotics Other (See Comments)    UNSPECIFIED REACTION OF CHILDHOOD  . Sulfamethoxazole Other (See Comments)    UNSPECIFIED REACTION OF CHILDHOOD  . Zolpidem Tartrate Anxiety      Nervous, uncontrollable    Past Medical History:  Diagnosis Date  . Acquired hallux rigidus of right foot 04/26/2017  . AKI (acute kidney injury) ([place]) 06/11/2017  . Anxiety   . Bipolar 1 disorder ([place])   . BMI 37.0-37.9, adult   . BPH (benign prostatic hyperplasia)   . Cataract   . Cataract    L eye  . CKD (chronic kidney disease), stage III ([place])   . Colon polyps   . COPD GOLD II with restrictive component  01/13/2016   Spirometry 01/13/2016  FEV1 1.84 (47%)  Ratio 62  - 01/13/2016  extensive coaching HFA effectiveness =    90% > try stiolto respimat 2 pffs each am > did not benefit so stopped when sample out - 01/13/2016  Walked RA x 3 laps @ 185 ft each stopped due to  End of study, nl pace, no desat  / min sob  - PFT's  03/16/2016  FEV1 2.28 (59 % ) ratio 67  p 12 % improvement from saba p no prior to study with DLCO  66 % corrects to 86 % for alv volume     . Depression   . Dysrhythmia   . Essential hypertension 01/19/2015  . Family history of coronary arteriosclerosis 01/19/2015   Father with MI   . Fatty liver   . GERD (gastroesophageal reflux disease) 08/11/2014  . History of colon polyps 06/18/2017  . Hyperlipidemia   . Hypertension   . Hypothyroidism 08/11/2014  . Hypothyroidism   . Insomnia 06/18/2017  . Insomnia   . Memory change   . Mixed hyperlipidemia 06/18/2017  . Morbid obesity ([place]) 07/11/9936   Complicated by HBP/ Low  erv on pfts 03/16/2016 (31%)    . Morbid obesity due to excess calories ([place])   . Prediabetes   . Sleep apnea   . SOB (shortness of breath)   . Thyroid disease   . Tinnitus of both ears 06/18/2017  . Tobacco use 06/18/2017  . Unsteadiness on feet   . Vitamin D deficiency 06/18/2017  . Vitamin D deficiency     Family History  Problem Relation  Age of Onset  . Cancer Mother   . Hyperlipidemia Father   . Hypertension Father   . CAD Father   . Stroke Father   . Aortic aneurysm Father   . Prostate cancer Father   . Hyperlipidemia Brother   . Appendicitis Maternal Grandfather     Social History   Socioeconomic History  . Marital status: Married    Spouse name: Not on file  . Number of children: Not on file  . Years of education: Not on file  . Highest education level: Associate degree: occupational, Hotel manager, or vocational program  Occupational History  . Occupation: act.  assist  Social Needs  . Financial resource strain: Not on file  . Food insecurity:    Worry: Not on file    Inability: Not on file  . Transportation needs:    Medical: Not on file    Non-medical: Not on file  Tobacco Use  . Smoking status: Former Smoker    Packs/day: 1.00    Years: 25.00    Pack years: 25.00    Types: Cigarettes    Last attempt to quit: 12/10/2000    Years since quitting: 18.1  . Smokeless tobacco: Never Used  . Tobacco comment: heavy vape user- quit vaping 06/04/2017  Substance and Sexual Activity  . Alcohol use: No    Alcohol/week: 0.0 standard drinks  . Drug use: No  . Sexual activity: Not on file  Lifestyle  . Physical activity:    Days per week: Not on file    Minutes per session: Not on file  . Stress: Not on file  Relationships  . Social connections:    Talks on phone: Not on file    Gets together: Not on file    Attends religious service: Not on file    Active member of club or organization: Not on file    Attends meetings of clubs or organizations: Not on file     Relationship status: Not on file  . Intimate partner violence:    Fear of current or ex partner: Not on file    Emotionally abused: Not on file    Physically abused: Not on file    Forced sexual activity: Not on file  Other Topics Concern  . Not on file  Social History Narrative   Admitted to [facility] 06/14/17- discharged   Lives at home with his wife   Married - [name]   Former smoker - stopped 2002   Alcohol none   Full code   Right handed   Drinks 2 cups of caffeine daily    Past Medical History, Surgical history, Social history, and Family history were reviewed and updated as appropriate.   Please see review of systems for further details on the patient's review from today.   Objective:   Physical Exam:  There were no vitals taken for this visit.  Physical Exam Constitutional:      General: He is not in acute distress.    Appearance: He is well-developed.  Musculoskeletal:        General: No deformity.  Neurological:     Mental Status: He is alert and oriented to person, place, and time.     Motor: No tremor.     Coordination: Coordination normal.     Gait: Gait normal.  Psychiatric:        Attention and Perception: He is attentive. He does not perceive auditory hallucinations.        Mood and  Affect: Mood is anxious and depressed. Affect is not labile, blunt, angry or inappropriate.        Speech: Speech normal.        Behavior: Behavior is hyperactive. Behavior is not agitated.        Thought Content: Thought content normal. Thought content is not paranoid. Thought content does not include homicidal or suicidal ideation. Thought content does not include homicidal or suicidal plan.        Cognition and Memory: Cognition normal.     Comments: Insight and judgment fair-poor. No auditory or visual hallucinations. No delusions.  Not irritable in office. More smiles and laughter and spontaneous speech better    Lab Review:     Component Value Date/Time   NA 140  08/13/2018 1020   K 4.3 08/13/2018 1020   CL 107 08/13/2018 1020   CO2 26 08/13/2018 1020   GLUCOSE 99 08/13/2018 1020   BUN 13 08/13/2018 1020   CREATININE 1.14 08/13/2018 1020   CALCIUM 9.4 08/13/2018 1020   PROT 6.9 02/11/2018 0623   ALBUMIN 4.2 02/11/2018 0623   AST 25 02/11/2018 0623   ALT 33 02/11/2018 0623   ALKPHOS 89 02/11/2018 0623   BILITOT 0.9 02/11/2018 0623   GFRNONAA >60 08/13/2018 1020   GFRAA >60 08/13/2018 1020       Component Value Date/Time   WBC 6.8 08/13/2018 1020   RBC 5.29 08/13/2018 1020   HGB 16.5 08/13/2018 1020   HCT 50.5 08/13/2018 1020   PLT 192 08/13/2018 1020   MCV 95.5 08/13/2018 1020   MCH 31.2 08/13/2018 1020   MCHC 32.7 08/13/2018 1020   RDW 12.0 08/13/2018 1020   LYMPHSABS 1.3 08/13/2018 1020   MONOABS 0.7 08/13/2018 1020   EOSABS 0.1 08/13/2018 1020   BASOSABS 0.1 08/13/2018 1020    No results found for: POCLITH, LITHIUM   No results found for: PHENYTOIN, PHENOBARB, VALPROATE, CBMZ   .res Assessment: Plan:    Bipolar 1 disorder, mixed, moderate (HCC)  Generalized anxiety disorder  Attention deficit hyperactivity disorder (ADHD), predominantly inattentive type   Severe TRD and anxiety. Was good after surgery July to December and then relapsed.   Affectively better this visit after adding pramipexole to 0.5 mg twice daily and increasing Seroquel to 600 mg daily.  He is much more spontaneously talkative and attentive.  He also displayed humor which is unusual.  He was not irritable in the office today.  It is noteworthy that he was seen alone today that may have affected his behavior.  He does not look overtly manic.  Discussed the risk of mania with increasing pramipexole in combination with selegiline.  To let us know if he sees any manic symptoms or worsening irritability or insomnia.  Discussed potential metabolic side effects associated with atypical antipsychotics, as well as potential risk for movement side effects. Advised  pt to contact office if movement side effects occur.   Discussed chronic issues with compliance that have complicated treatments at times.  He has a tendency to change medications on his own if he gets frustrated.  This can sabotages recovery.    Increase in pramipexole to help with mood to 0.75 BID. He's taken 2mg  daily in the past with paxil but the combo with selegiline could be even more potent for DA effects. Explained the combination and how it's different. Disc risks mania and impulsivity.  Polypharmacy is a real potential problem but appears unavoidable at this time.  Particularly the combination of  dopamine agonists and dopamine antagonists is not ideal but given that he does not respond to any of the traditional mood stabilizers that are not atypicals this is the result.  Consider amanatadine for the same reason.  Other options [facility]  This appt was 30 mins.  FU 6 weeks.  Lynder Parents, MD, DFAPA  Please see After Visit Summary for patient specific instructions.  Future Appointments  Date Time Provider [place]  01/30/2019  1:30 PM Cottle, Billey Co., MD [facility] None  02/25/2019 10:30 AM Cottle, Billey Co., MD [facility] None    No orders of the defined types were placed in this encounter.     -------------------------------

## 2019-02-23 ENCOUNTER — Telehealth: Payer: Self-pay | Admitting: Psychiatry

## 2019-02-23 MED ORDER — LAMOTRIGINE 100 MG PO TABS: 200.0000 mg | ORAL_TABLET | Freq: Two times a day (BID) | ORAL | 0 refills | Status: DC

## 2019-02-23 NOTE — Telephone Encounter (Signed)
Ok to leave at 100 mg tablet lamotrigine 2 tablets BID.  Sent in.  Lynder Parents, MD, DFAPA

## 2019-02-23 NOTE — Telephone Encounter (Signed)
Pt needs refill of Lamotrigine 100mg . He takes 1q am and 2 qhs. He was taking 2 in the am and 2 qhs that's why he ran out so fast.

## 2019-02-23 NOTE — Telephone Encounter (Signed)
Okay dosage increase to 100 mg tablets lamotrigine 2 tablets twice a day.  Please notify patient that he can continue this dosage.  It may help his depression more.  I have sent the prescription in.  Lynder Parents, MD, DFAPA

## 2019-02-25 ENCOUNTER — Ambulatory Visit: Payer: PPO | Admitting: Psychiatry

## 2019-02-25 NOTE — Telephone Encounter (Signed)
Pt. Made aware and verbalized understanding.

## 2019-03-31 ENCOUNTER — Other Ambulatory Visit: Payer: Self-pay | Admitting: Psychiatry

## 2019-04-06 ENCOUNTER — Encounter: Payer: Self-pay | Admitting: Psychiatry

## 2019-04-06 ENCOUNTER — Other Ambulatory Visit: Payer: Self-pay

## 2019-04-06 ENCOUNTER — Ambulatory Visit (INDEPENDENT_AMBULATORY_CARE_PROVIDER_SITE_OTHER): Payer: PPO | Admitting: Psychiatry

## 2019-04-06 DIAGNOSIS — F411 Generalized anxiety disorder: Secondary | ICD-10-CM

## 2019-04-06 DIAGNOSIS — F3162 Bipolar disorder, current episode mixed, moderate: Secondary | ICD-10-CM | POA: Diagnosis not present

## 2019-04-06 DIAGNOSIS — F900 Attention-deficit hyperactivity disorder, predominantly inattentive type: Secondary | ICD-10-CM | POA: Diagnosis not present

## 2019-04-06 NOTE — Progress Notes (Addendum)
Reginald Tucker [id number] 05/29/1956 63 y.o.  Virtual Visit via Telephone Note  I connected with pt by telephone and verified that I am speaking with the correct person using two identifiers.   I discussed the limitations, risks, security and privacy concerns of performing an evaluation and management service by telephone and the availability of in person appointments. I also discussed with the patient that there may be a patient responsible charge related to this service. The patient expressed understanding and agreed to proceed.  I discussed the assessment and treatment plan with the patient. The patient was provided an opportunity to ask questions and all were answered. The patient agreed with the plan and demonstrated an understanding of the instructions.   The patient was advised to call back or seek an in-person evaluation if the symptoms worsen or if the condition fails to improve as anticipated.  I provided 30 minutes of non-face-to-face time during this encounter. The call started at 11:00 and ended at 1130. The patient was located at home and the provider was located office.   Subjective:   Patient ID:  Reginald Tucker is a 62 y.o. (DOB Dec 11, 1955) male.  Chief Complaint:  Chief Complaint  Patient presents with  . Follow-up    Medication Management  . Depression    Medication Management  . Anxiety    Medication Management  . Other    Anger    Depression         Associated symptoms include no decreased concentration and no suicidal ideas.  Past medical history includes anxiety.   Anxiety  Symptoms include nervous/anxious behavior. Patient reports no confusion, decreased concentration or suicidal ideas.     [name] presents to the office today for follow-up of TRD and anxiety.  Last seen January 30, 2019.  For chronic bipolar depression pramipexole was used off label and increased to 0.75 mg twice daily in hopes of improving the response for  depression.  Covid makes me angry.  Virus has shut [place] down.    Tried pramipexole BID makes me nervous as heck.  Didn't help with depression either.  Took it for a couple weeks..    Pt reports that mood is Anxious, Depressed and Irritable rated 7/10 bad and describes anxiety as Moderate.  Staying in the house is making it worse too.   Anxiety symptoms include: Excessive Worry, Panic Symptoms, Social Anxiety,. Pt reports no sleep issues. Pt reports that appetite is good. Pt reports that energy is poor and anhedonia, loss of interest or pleasure in usual activities, poor motivation and withdrawn from usual activities. Concentration is down slightly. Suicidal thoughts:  denied by patient.  Still has days that does very little.  Will clean some.  According to Fitbit not a lot of deep sleep.  In January we retried pramipexole 0.5 BID.  Seems like I'm getting a little better.  Not much but noticeable.  No SE. No real change in sleep pattern except to sleep quicker with increase in Seroquel to 600 HS.  Gets about 8 hours.  Not laying in bed a lot.   Anxiety is about the same.  No change in productivity does a little shopping and washes the car.  Tries to walk when he goes to stores.  Low motivation and some activity.  Reading a lot.  Loves history. She thinks he makes poor decisions.  Can't let things go.    Sleep study negative for OSA noted on chart.  Stopped and restarted Risperidone  bc anxiety got worse off of it. About a month.   Was worrying more and it helps.  No effect on depression. W says he seems pretty miserable.   Won't go celebrate birthday with her family.    Never tried the Adderall bc they were afraid of the possible SE of mood swings.    Past Psychiatric Medication Trials: He has had multiple psych med failures as well .  Past psychiatric medications used include pramipexole, sertraline, Trintellix, Vraylar 3mg , lamotrigine, buspirone, lithium with a tremor, Depakote was side  effects of feeling heavy and increased ammonia, risperidone, olanzapine, Wellbutrin, Seroquel 800 mg a day, Latuda 120 mg a day, carbamazepine, Rexulti, Paxil, perphenazine, modafinil, selegiline max 30mg  daily, Light therapy failure. No history of stimulant  Review of Systems:  Review of Systems  Neurological: Negative for tremors and weakness.  Psychiatric/Behavioral: Positive for depression and dysphoric mood. Negative for agitation, behavioral problems, confusion, decreased concentration, hallucinations, self-injury, sleep disturbance and suicidal ideas. The patient is nervous/anxious. The patient is not hyperactive.   Occurs 2-3 times/weekre: leg weakness.  Not orthostatic.  Medications: I have reviewed the patient's current medications.  Current Outpatient Medications  Medication Sig Dispense Refill  . aspirin EC 81 MG tablet Take 81 mg by mouth daily.    . Cholecalciferol (VITAMIN D3 PO) Take 15,000 Units by mouth daily.     Marland Kitchen L-THEANINE PO Take by mouth.    . lamoTRIgine (LAMICTAL) 100 MG tablet Take 2 tablets (200 mg total) by mouth 2 (two) times daily. 360 tablet 0  . levothyroxine (SYNTHROID, LEVOTHROID) 150 MCG tablet Take 150 mcg by mouth daily before breakfast.    . lovastatin (MEVACOR) 40 MG tablet Take 40 mg by mouth daily.     Marland Kitchen MAGNESIUM CITRATE PO Take 1-2 tablets by mouth 2 (two) times daily. 400 mg, 2 tabs in the morning, 1 tab at bedtime    . Methylcobalamin 1 MG CHEW Chew 1 tablet by mouth daily.    Marland Kitchen ofloxacin (OCUFLOX) 0.3 % ophthalmic solution Instill 1 drop into the right eye every 3 hours-  0  . Omega-3 Fatty Acids (SUPER OMEGA 3 PO) Take 1 capsule by mouth 2 (two) times daily.     Marland Kitchen omeprazole (PRILOSEC) 20 MG capsule Take 20 mg by mouth daily.    Marland Kitchen OVER THE COUNTER MEDICATION Place 1 Squirt under the tongue at bedtime. Hemp oil  500 mg    . pramipexole (MIRAPEX) 0.75 MG tablet Take 1 tablet (0.75 mg total) by mouth 2 (two) times daily. (Patient taking  differently: Take 0.75 mg by mouth 2 (two) times a day. ) 180 tablet 0  . QUEtiapine (SEROQUEL) 400 MG tablet Take 1.5 tablets (600 mg total) by mouth at bedtime. 135 tablet 0  . risperiDONE (RISPERDAL) 1 MG tablet TAKE 1 TABLET(1 MG) BY MOUTH TWICE DAILY 180 tablet 0  . selegiline (ELDEPRYL) 5 MG tablet Take 4 tablets (20 mg total) by mouth 2 (two) times daily with a meal. AM dose and second dose 4 hours later 240 tablet 0   No current facility-administered medications for this visit.     Medication Side Effects:got nervous and irritable with 1.5 mg pramipexole in morning, ok with it split up and no change in mood.  Allergies:  Allergies  Allergen Reactions  . Sulfa Antibiotics Other (See Comments)    UNSPECIFIED REACTION OF CHILDHOOD  . Sulfamethoxazole Other (See Comments)    UNSPECIFIED REACTION OF CHILDHOOD  . Zolpidem Tartrate Anxiety  Nervous, uncontrollable    Past Medical History:  Diagnosis Date  . Acquired hallux rigidus of right foot 04/26/2017  . AKI (acute kidney injury) ([place]) 06/11/2017  . Anxiety   . Bipolar 1 disorder ([place])   . BMI 37.0-37.9, adult   . BPH (benign prostatic hyperplasia)   . Cataract   . Cataract    L eye  . CKD (chronic kidney disease), stage III ([place])   . Colon polyps   . COPD GOLD II with restrictive component  01/13/2016   Spirometry 01/13/2016  FEV1 1.84 (47%)  Ratio 62  - 01/13/2016  extensive coaching HFA effectiveness =    90% > try stiolto respimat 2 pffs each am > did not benefit so stopped when sample out - 01/13/2016  Walked RA x 3 laps @ 185 ft each stopped due to  End of study, nl pace, no desat  / min sob  - PFT's  03/16/2016  FEV1 2.28 (59 % ) ratio 67  p 12 % improvement from saba p no prior to study with DLCO  66 % corrects to 86 % for alv volume     . Depression   . Dysrhythmia   . Essential hypertension 01/19/2015  . Family history of coronary arteriosclerosis 01/19/2015   Father with MI   . Fatty liver   . GERD (gastroesophageal  reflux disease) 08/11/2014  . History of colon polyps 06/18/2017  . Hyperlipidemia   . Hypertension   . Hypothyroidism 08/11/2014  . Hypothyroidism   . Insomnia 06/18/2017  . Insomnia   . Memory change   . Mixed hyperlipidemia 06/18/2017  . Morbid obesity ([place]) 02/11/1936   Complicated by HBP/ Low erv on pfts 03/16/2016 (31%)    . Morbid obesity due to excess calories ([place])   . Prediabetes   . Sleep apnea   . SOB (shortness of breath)   . Thyroid disease   . Tinnitus of both ears 06/18/2017  . Tobacco use 06/18/2017  . Unsteadiness on feet   . Vitamin D deficiency 06/18/2017  . Vitamin D deficiency     Family History  Problem Relation Age of Onset  . Cancer Mother   . Hyperlipidemia Father   . Hypertension Father   . CAD Father   . Stroke Father   . Aortic aneurysm Father   . Prostate cancer Father   . Hyperlipidemia Brother   . Appendicitis Maternal Grandfather     Social History   Socioeconomic History  . Marital status: Married    Spouse name: Not on file  . Number of children: Not on file  . Years of education: Not on file  . Highest education level: Associate degree: occupational, Hotel manager, or vocational program  Occupational History  . Occupation: act.  assist  Social Needs  . Financial resource strain: Not on file  . Food insecurity:    Worry: Not on file    Inability: Not on file  . Transportation needs:    Medical: Not on file    Non-medical: Not on file  Tobacco Use  . Smoking status: Former Smoker    Packs/day: 1.00    Years: 25.00    Pack years: 25.00    Types: Cigarettes    Last attempt to quit: 12/10/2000    Years since quitting: 18.3  . Smokeless tobacco: Never Used  . Tobacco comment: heavy vape user- quit vaping 06/04/2017  Substance and Sexual Activity  . Alcohol use: No    Alcohol/week: 0.0 standard  drinks  . Drug use: No  . Sexual activity: Not on file  Lifestyle  . Physical activity:    Days per week: Not on file    Minutes per session:  Not on file  . Stress: Not on file  Relationships  . Social connections:    Talks on phone: Not on file    Gets together: Not on file    Attends religious service: Not on file    Active member of club or organization: Not on file    Attends meetings of clubs or organizations: Not on file    Relationship status: Not on file  . Intimate partner violence:    Fear of current or ex partner: Not on file    Emotionally abused: Not on file    Physically abused: Not on file    Forced sexual activity: Not on file  Other Topics Concern  . Not on file  Social History Narrative   Admitted to [facility] 06/14/17- discharged   Lives at home with his wife   Married - [name]   Former smoker - stopped 2002   Alcohol none   Full code   Right handed   Drinks 2 cups of caffeine daily    Past Medical History, Surgical history, Social history, and Family history were reviewed and updated as appropriate.   Please see review of systems for further details on the patient's review from today.   Objective:   Physical Exam:  There were no vitals taken for this visit.  Physical Exam Constitutional:      General: He is not in acute distress.    Appearance: He is well-developed.  Musculoskeletal:        General: No deformity.  Neurological:     Mental Status: He is alert and oriented to person, place, and time.     Motor: No tremor.     Coordination: Coordination normal.     Gait: Gait normal.  Psychiatric:        Attention and Perception: He is attentive. He does not perceive auditory hallucinations.        Mood and Affect: Mood is anxious and depressed. Affect is not labile, blunt, angry or inappropriate.        Speech: Speech normal.        Behavior: Behavior is hyperactive. Behavior is not agitated.        Thought Content: Thought content normal. Thought content is not paranoid. Thought content does not include homicidal or suicidal ideation. Thought content does not include homicidal or  suicidal plan.        Cognition and Memory: Cognition normal.     Comments: Insight and judgment fair-poor. No auditory or visual hallucinations. No delusions.  Not irritable in office. More smiles and laughter and spontaneous speech better    Lab Review:     Component Value Date/Time   NA 140 08/13/2018 1020   K 4.3 08/13/2018 1020   CL 107 08/13/2018 1020   CO2 26 08/13/2018 1020   GLUCOSE 99 08/13/2018 1020   BUN 13 08/13/2018 1020   CREATININE 1.14 08/13/2018 1020   CALCIUM 9.4 08/13/2018 1020   PROT 6.9 02/11/2018 0623   ALBUMIN 4.2 02/11/2018 0623   AST 25 02/11/2018 0623   ALT 33 02/11/2018 0623   ALKPHOS 89 02/11/2018 0623   BILITOT 0.9 02/11/2018 0623   GFRNONAA >60 08/13/2018 1020   GFRAA >60 08/13/2018 1020       Component Value Date/Time  WBC 6.8 08/13/2018 1020   RBC 5.29 08/13/2018 1020   HGB 16.5 08/13/2018 1020   HCT 50.5 08/13/2018 1020   PLT 192 08/13/2018 1020   MCV 95.5 08/13/2018 1020   MCH 31.2 08/13/2018 1020   MCHC 32.7 08/13/2018 1020   RDW 12.0 08/13/2018 1020   LYMPHSABS 1.3 08/13/2018 1020   MONOABS 0.7 08/13/2018 1020   EOSABS 0.1 08/13/2018 1020   BASOSABS 0.1 08/13/2018 1020    No results found for: POCLITH, LITHIUM   No results found for: PHENYTOIN, PHENOBARB, VALPROATE, CBMZ   .res Assessment: Plan:    Bipolar 1 disorder, mixed, moderate (HCC)  Generalized anxiety disorder  Attention deficit hyperactivity disorder (ADHD), predominantly inattentive type   Severe TRD and anxiety. Was good after surgery July to December and then relapsed.   Affectively better this visit after adding pramipexole to 0.5 mg twice daily and increasing Seroquel to 600 mg daily.  He is much more spontaneously talkative and attentive.  He also displayed humor which is unusual.  He was not irritable in the office today.  It is noteworthy that he was seen alone today that may have affected his behavior.  He does not look overtly manic.  Discussed the  risk of mania with increasing pramipexole in combination with selegiline.  To let us know if he sees any manic symptoms or worsening irritability or insomnia.  Discussed potential metabolic side effects associated with atypical antipsychotics, as well as potential risk for movement side effects. Advised pt to contact office if movement side effects occur.   Reduce pramipexole to 1/2 twice daily for 1 week and stop. He's not had a noticeable marked effect from it.  And felt too anxious when he took a higher dose with the selegiline.    We will get the old chart and review his experience with Vraylar.  They ask about retrying that medication.  Again trying it with selegiline would be a different potential outcome than trying it alone.  But the old chart needs to be reviewed for the reasons that he did not stay on the [place] previously.  The polypharmacy going on here in combination with the Seroquel makes predictability more difficult but the polypharmacy is necessary due to this chronic severity of his mixed symptoms.  Polypharmacy is a real potential problem but appears unavoidable at this time.  Particularly the combination of dopamine agonists and dopamine antagonists is not ideal but given that he does not respond to any of the traditional mood stabilizers that are not atypicals this is the result.  Consider amanatadine for the same reason.  Other options Saphris.  Option trial Ritalin.  Discussed the risk of mania and irritability and he prefers not to pursue that option at this time.  Option retry Vraylar with selegiline which wasn't done before.  This appt was 30 mins.  FU 6 weeks.  Lynder Parents, MD, DFAPA  Addendum: 04/06/2019 at 7:41pm Reviewed old chart.  Previous trial with Vraylar was at 3 mg daily and he had apparent akathisia.  It is reasonable to retry the Vraylar at 1.5 mg daily.  Informed patient after stopping pramipexole to pick up samples of Vraylar 1.5 mg to take 1 each  morning.  Lynder Parents MD, DFAPA  No future appointments.  No orders of the defined types were placed in this encounter.     -------------------------------

## 2019-04-09 ENCOUNTER — Telehealth: Payer: Self-pay

## 2019-04-09 NOTE — Telephone Encounter (Signed)
Called and spoke with his wife. She verbalized an understanding.. When Ruxin stops the pramipexole she is to  pick up samples of Vraylar 1.5 mg and give him 1 each morning. She will call if there are any complications.

## 2019-04-13 ENCOUNTER — Other Ambulatory Visit: Payer: Self-pay | Admitting: Psychiatry

## 2019-04-14 ENCOUNTER — Telehealth: Payer: Self-pay | Admitting: Psychiatry

## 2019-04-14 NOTE — Telephone Encounter (Signed)
I think they mean picked up samples at office and there were no instructions. Will contact pt to see what they picked up from office.

## 2019-04-14 NOTE — Telephone Encounter (Signed)
I spoke with [name] his wife and let her know he is to take 1.5 mg every morning. She verbalized an understanding and will call if there are any complications.

## 2019-04-14 NOTE — Telephone Encounter (Signed)
Patient left vm @11 :28 stating he picked up a script for Vraylar at his pharmacy with no instructions please call with instructions on how to take medication

## 2019-04-23 ENCOUNTER — Telehealth: Payer: Self-pay | Admitting: Psychiatry

## 2019-04-23 NOTE — Telephone Encounter (Signed)
Pt stated the Vraylar  He is taking is giving him the shakes. Stated he stopped taking it. Please advise.

## 2019-04-24 NOTE — Telephone Encounter (Signed)
He was also sensitive to Vraylar 3 mg.  He is apparently sensitive to the 1.5 mg 2 which was started on April 27.  We will go even lower on the dose.  Tell him to stop the medicine until the side effects resolve and then restart the Vraylar every other day 1.5 mg.  By reducing the dosage we are attempting to get below the dose that will give him any side effects.  The point of this medication is to help with his depression.

## 2019-04-24 NOTE — Telephone Encounter (Signed)
Left a detailed message on his voicemail. Will call back Monday morning to make sure he received message.

## 2019-04-27 NOTE — Telephone Encounter (Signed)
Left a detailed message with information and to call back and discuss

## 2019-04-29 NOTE — Telephone Encounter (Signed)
Spoke with pt's wife. She stated he waited around 4 days but SE never went away. He restarted the medication anyway at every other day and is still shaking. Please advise

## 2019-04-29 NOTE — Telephone Encounter (Signed)
4 days is not be long enough for this medication to drop significantly in his bloodstream.  It may take a week to 10 days.  So he just needs to stop it and wait until the tremors resolve.

## 2019-05-22 ENCOUNTER — Other Ambulatory Visit: Payer: Self-pay | Admitting: Psychiatry

## 2019-06-26 ENCOUNTER — Other Ambulatory Visit: Payer: Self-pay | Admitting: Psychiatry

## 2019-07-28 ENCOUNTER — Other Ambulatory Visit: Payer: Self-pay | Admitting: Psychiatry

## 2019-08-07 ENCOUNTER — Other Ambulatory Visit: Payer: Self-pay

## 2019-08-07 ENCOUNTER — Ambulatory Visit (INDEPENDENT_AMBULATORY_CARE_PROVIDER_SITE_OTHER): Payer: PPO | Admitting: Psychiatry

## 2019-08-07 ENCOUNTER — Encounter: Payer: Self-pay | Admitting: Psychiatry

## 2019-08-07 DIAGNOSIS — F900 Attention-deficit hyperactivity disorder, predominantly inattentive type: Secondary | ICD-10-CM | POA: Diagnosis not present

## 2019-08-07 DIAGNOSIS — F3162 Bipolar disorder, current episode mixed, moderate: Secondary | ICD-10-CM | POA: Diagnosis not present

## 2019-08-07 DIAGNOSIS — G3184 Mild cognitive impairment, so stated: Secondary | ICD-10-CM | POA: Diagnosis not present

## 2019-08-07 DIAGNOSIS — F411 Generalized anxiety disorder: Secondary | ICD-10-CM

## 2019-08-07 NOTE — Progress Notes (Signed)
Reginald Tucker [id number] 10/30/1956 63 y.o.   Subjective:   Patient ID:  Reginald Tucker is a 63 y.o. (DOB May 08, 1956) male.  Chief Complaint:  Chief Complaint  Patient presents with  . Follow-up    Medication management  . ADD    Medication management  . Anxiety    Medication management  . Depression    Depression        Associated symptoms include fatigue.  Associated symptoms include no decreased concentration and no suicidal ideas.  Past medical history includes anxiety.   Anxiety Symptoms include nervous/anxious behavior. Patient reports no confusion, decreased concentration or suicidal ideas.     Reginald Tucker presents to the office today for follow-up of TRD and anxiety.  Seen alone today.  Step son moving to an apt.  Last seen April 06, 2019.  He had not seen any mood benefit from low-dose pramipexole and had side effects at higher dosages.  Therefore we weaned him off of the that medication.  Old chart was reviewed and then discussed with he and his wife on April 30 and the decision was made to initiate a trial of Vraylar 1.5 mg daily in combination with the selegiline as this was not done in the past.  He called back on May 14 stating he had stopped the Vraylar because of tremors.  It was suggested that he had not had an adequate trial and that we could go lower in the dose.  Encouraged him to stop the medication wait till the side effects resolved and then restart it 1.5 mg every other day.  Not taking Vraylar DT tremors but doesn't remember how long he took it. No other SE.   Poor memory and wife not hear to give detail.  Shakes resolved off the meds.     Last month fighting depression more.  Waves of depression worse in the morning.  Sleeping too much and lethargic.  Deep sleep.  Takes 60 min to fall asleep.  More irritable.   Covid still makes me angry.  Virus has shut [place] down.    Pt reports that mood is Anxious, Depressed and Irritable rated 7/10 bad and  describes anxiety as Moderate and occassional.  Less anxiety this visit than depression..  Staying in the house is making it worse too.   Anxiety symptoms include: Excessive Worry, Panic Symptoms, Social Anxiety,. Pt reports no sleep issues. Pt reports that appetite is good. Pt reports that energy is poor and anhedonia, loss of interest or pleasure in usual activities, poor motivation and withdrawn from usual activities. Concentration is down slightly. Suicidal thoughts:  denied by patient.  Still has days that does very little.  Will clean occ.    Sleep study negative for OSA noted on chart.  Stopped and restarted Risperidone bc anxiety got worse off of it. About a month.   Was worrying more and it helps.  No effect on depression. W says he seems pretty miserable.   Won't go celebrate birthday with her family.    Never tried the Adderall bc they were afraid of the possible SE of mood swings.    Past Psychiatric Medication Trials: He has had multiple psych med failures as well .  Past psychiatric medications used include pramipexole NR, sertraline, Trintellix, Vraylar 3mg  akathisia, lamotrigine, buspirone, lithium with a tremor, Depakote was side effects of feeling heavy and increased ammonia, risperidone, olanzapine, Wellbutrin, Seroquel 800 mg a day, Latuda 120 mg a day, carbamazepine, Rexulti, Paxil, perphenazine,  modafinil, selegiline max 30mg  daily, Light therapy failure. No history of stimulant  Review of Systems:  Review of Systems  Constitutional: Positive for fatigue.  Musculoskeletal: Positive for back pain.  Neurological: Negative for tremors and weakness.  Psychiatric/Behavioral: Positive for depression and dysphoric mood. Negative for agitation, behavioral problems, confusion, decreased concentration, hallucinations, self-injury, sleep disturbance and suicidal ideas. The patient is nervous/anxious. The patient is not hyperactive.   Occurs 2-3 times/weekre: leg weakness.  Not  orthostatic.  Medications: I have reviewed the patient's current medications.  Current Outpatient Medications  Medication Sig Dispense Refill  . aspirin EC 81 MG tablet Take 81 mg by mouth daily.    . Cholecalciferol (VITAMIN D3 PO) Take 15,000 Units by mouth daily.     Marland Kitchen L-THEANINE PO Take by mouth.    . lamoTRIgine (LAMICTAL) 100 MG tablet TAKE 2 TABLETS(200 MG) BY MOUTH TWICE DAILY 360 tablet 0  . levothyroxine (SYNTHROID, LEVOTHROID) 150 MCG tablet Take 150 mcg by mouth daily before breakfast.    . lovastatin (MEVACOR) 40 MG tablet Take 40 mg by mouth daily.     Marland Kitchen MAGNESIUM CITRATE PO Take 1-2 tablets by mouth 2 (two) times daily. 400 mg, 2 tabs in the morning, 1 tab at bedtime    . Methylcobalamin 1 MG CHEW Chew 1 tablet by mouth daily.    Marland Kitchen ofloxacin (OCUFLOX) 0.3 % ophthalmic solution Instill 1 drop into the right eye every 3 hours-  0  . Omega-3 Fatty Acids (SUPER OMEGA 3 PO) Take 1 capsule by mouth 2 (two) times daily.     Marland Kitchen omeprazole (PRILOSEC) 20 MG capsule Take 20 mg by mouth daily.    Marland Kitchen OVER THE COUNTER MEDICATION Place 1 Squirt under the tongue at bedtime. Hemp oil  500 mg    . QUEtiapine (SEROQUEL) 400 MG tablet Take 1.5 tablets (600 mg total) by mouth at bedtime. 135 tablet 0  . risperiDONE (RISPERDAL) 1 MG tablet TAKE 1 TABLET(1 MG) BY MOUTH TWICE DAILY 180 tablet 0  . selegiline (ELDEPRYL) 5 MG tablet Take 4 tablets (20 mg total) by mouth 2 (two) times daily with a meal. AM dose and second dose 4 hours later 240 tablet 0   No current facility-administered medications for this visit.     Medication Side Effects:got nervous and irritable with 1.5 mg pramipexole in morning, ok with it split up and no change in mood.  Allergies:  Allergies  Allergen Reactions  . Sulfa Antibiotics Other (See Comments)    UNSPECIFIED REACTION OF CHILDHOOD  . Sulfamethoxazole Other (See Comments)    UNSPECIFIED REACTION OF CHILDHOOD  . Zolpidem Tartrate Anxiety      Nervous,  uncontrollable    Past Medical History:  Diagnosis Date  . Acquired hallux rigidus of right foot 04/26/2017  . AKI (acute kidney injury) ([place]) 06/11/2017  . Anxiety   . Bipolar 1 disorder ([place])   . BMI 37.0-37.9, adult   . BPH (benign prostatic hyperplasia)   . Cataract   . Cataract    L eye  . CKD (chronic kidney disease), stage III ([place])   . Colon polyps   . COPD GOLD II with restrictive component  01/13/2016   Spirometry 01/13/2016  FEV1 1.84 (47%)  Ratio 62  - 01/13/2016  extensive coaching HFA effectiveness =    90% > try stiolto respimat 2 pffs each am > did not benefit so stopped when sample out - 01/13/2016  Walked RA x 3 laps @ 185 ft  each stopped due to  End of study, nl pace, no desat  / min sob  - PFT's  03/16/2016  FEV1 2.28 (59 % ) ratio 67  p 12 % improvement from saba p no prior to study with DLCO  66 % corrects to 86 % for alv volume     . Depression   . Dysrhythmia   . Essential hypertension 01/19/2015  . Family history of coronary arteriosclerosis 01/19/2015   Father with MI   . Fatty liver   . GERD (gastroesophageal reflux disease) 08/11/2014  . History of colon polyps 06/18/2017  . Hyperlipidemia   . Hypertension   . Hypothyroidism 08/11/2014  . Hypothyroidism   . Insomnia 06/18/2017  . Insomnia   . Memory change   . Mixed hyperlipidemia 06/18/2017  . Morbid obesity ([place]) [id number]   Complicated by HBP/ Low erv on pfts 03/16/2016 (31%)    . Morbid obesity due to excess calories ([place])   . Prediabetes   . Sleep apnea   . SOB (shortness of breath)   . Thyroid disease   . Tinnitus of both ears 06/18/2017  . Tobacco use 06/18/2017  . Unsteadiness on feet   . Vitamin D deficiency 06/18/2017  . Vitamin D deficiency     Family History  Problem Relation Age of Onset  . Cancer Mother   . Hyperlipidemia Father   . Hypertension Father   . CAD Father   . Stroke Father   . Aortic aneurysm Father   . Prostate cancer Father   . Hyperlipidemia Brother   . Appendicitis Maternal  Grandfather     Social History   Socioeconomic History  . Marital status: Married    Spouse name: Not on file  . Number of children: Not on file  . Years of education: Not on file  . Highest education level: Associate degree: occupational, Hotel manager, or vocational program  Occupational History  . Occupation: act.  assist  Social Needs  . Financial resource strain: Not on file  . Food insecurity    Worry: Not on file    Inability: Not on file  . Transportation needs    Medical: Not on file    Non-medical: Not on file  Tobacco Use  . Smoking status: Former Smoker    Packs/day: 1.00    Years: 25.00    Pack years: 25.00    Types: Cigarettes    Quit date: 12/10/2000    Years since quitting: 18.6  . Smokeless tobacco: Never Used  . Tobacco comment: heavy vape user- quit vaping 06/04/2017  Substance and Sexual Activity  . Alcohol use: No    Alcohol/week: 0.0 standard drinks  . Drug use: No  . Sexual activity: Not on file  Lifestyle  . Physical activity    Days per week: Not on file    Minutes per session: Not on file  . Stress: Not on file  Relationships  . Social Herbalist on phone: Not on file    Gets together: Not on file    Attends religious service: Not on file    Active member of club or organization: Not on file    Attends meetings of clubs or organizations: Not on file    Relationship status: Not on file  . Intimate partner violence    Fear of current or ex partner: Not on file    Emotionally abused: Not on file    Physically abused: Not on file  Forced sexual activity: Not on file  Other Topics Concern  . Not on file  Social History Narrative   Admitted to [facility] 06/14/17- discharged   Lives at home with his wife   Married - [name]   Former smoker - stopped 2002   Alcohol none   Full code   Right handed   Drinks 2 cups of caffeine daily    Past Medical History, Surgical history, Social history, and Family history were reviewed and  updated as appropriate.   Please see review of systems for further details on the patient's review from today.   Objective:   Physical Exam:  There were no vitals taken for this visit.  Physical Exam Constitutional:      General: He is not in acute distress.    Appearance: He is well-developed. He is obese.  Musculoskeletal:        General: No deformity.  Neurological:     Mental Status: He is alert and oriented to person, place, and time.     Motor: No tremor.     Coordination: Coordination normal.     Gait: Gait normal.  Psychiatric:        Attention and Perception: He is attentive. He does not perceive auditory hallucinations.        Mood and Affect: Mood is anxious and depressed. Affect is not labile, blunt, angry or inappropriate.        Speech: Speech normal.        Behavior: Behavior is not agitated or hyperactive.        Thought Content: Thought content normal. Thought content is not paranoid. Thought content does not include homicidal or suicidal ideation. Thought content does not include homicidal or suicidal plan.        Cognition and Memory: He exhibits impaired recent memory.     Comments: Insight and judgment fair-poor. No auditory or visual hallucinations. No delusions.  Not irritable in office.     Lab Review:     Component Value Date/Time   NA 140 08/13/2018 1020   K 4.3 08/13/2018 1020   CL 107 08/13/2018 1020   CO2 26 08/13/2018 1020   GLUCOSE 99 08/13/2018 1020   BUN 13 08/13/2018 1020   CREATININE 1.14 08/13/2018 1020   CALCIUM 9.4 08/13/2018 1020   PROT 6.9 02/11/2018 0623   ALBUMIN 4.2 02/11/2018 0623   AST 25 02/11/2018 0623   ALT 33 02/11/2018 0623   ALKPHOS 89 02/11/2018 0623   BILITOT 0.9 02/11/2018 0623   GFRNONAA >60 08/13/2018 1020   GFRAA >60 08/13/2018 1020       Component Value Date/Time   WBC 6.8 08/13/2018 1020   RBC 5.29 08/13/2018 1020   HGB 16.5 08/13/2018 1020   HCT 50.5 08/13/2018 1020   PLT 192 08/13/2018 1020   MCV  95.5 08/13/2018 1020   MCH 31.2 08/13/2018 1020   MCHC 32.7 08/13/2018 1020   RDW 12.0 08/13/2018 1020   LYMPHSABS 1.3 08/13/2018 1020   MONOABS 0.7 08/13/2018 1020   EOSABS 0.1 08/13/2018 1020   BASOSABS 0.1 08/13/2018 1020    No results found for: POCLITH, LITHIUM   No results found for: PHENYTOIN, PHENOBARB, VALPROATE, CBMZ   .res Assessment: Plan:    Bipolar 1 disorder, mixed, moderate (HCC)  Generalized anxiety disorder  Attention deficit hyperactivity disorder (ADHD), predominantly inattentive type  Mild cognitive impairment   Severe TRD and anxiety. Was good after surgery July to December and then relapsed.  Affectively better  after adding pramipexole to 0.5 mg twice daily and increasing Seroquel to 600 mg daily.  He was much more spontaneously talkative and attentive.  He also displayed humor which is unusual.  He was not irritable in the office .  It is noteworthy that he was seen alone today that may have affected his behavior.  He did not look overtly manic.  However at follow-up the patient felt that the pramipexole was not really helping and it was discontinued.  Chart was reviewed immediately after his last visit and the decision was made to retry Vraylar which had not been used in combination with selegiline which might improve its effectiveness.  He had problems with tremor and stopped it.  He was instructed to restart it every other day but he did not follow the instructions properly and still had tremor.  So he has ongoing complaints of depression and irritability and anxiety.  Discussed potential metabolic side effects associated with atypical antipsychotics, as well as potential risk for movement side effects. Advised pt to contact office if movement side effects occur.   Polypharmacy is a real potential problem but appears unavoidable at this time.  Particularly the combination of dopamine agonists and dopamine antagonists is not ideal but given that he does not  respond to any of the traditional mood stabilizers that are not atypicals this is the result.  Consider amanatadine for the same reason.  Other options Saphris.  Option trial Ritalin.  Discussed the risk of mania and irritability and he prefers not to pursue that option at this time.  Option retry Vraylar with selegiline which wasn't adequately done before.  Prior trial inadequate duration and SE Retry Vraylar at a lower dosage to prevent tremors: 1 capsule every Monday, Wednesday, Friday only. This is a good bipolar depression medication and some people are sensitive to it and have to take a low dosage like this.  Greater than 50% of face to face time with patient was spent on counseling and coordination of care. This appt was 30 mins.  FU 6 weeks.  Lynder Parents, MD, DFAPA    Future Appointments  Date Time Provider [place]  09/21/2019 11:30 AM Cottle, Billey Co., MD [facility] None    No orders of the defined types were placed in this encounter.     -------------------------------

## 2019-08-07 NOTE — Patient Instructions (Signed)
Retry Vraylar at a lower dosage to prevent tremors: 1 capsule every Monday, Wednesday, Friday only. This is a good bipolar depression medication and some people are sensitive to it and have to take a low dosage like this.

## 2019-08-24 ENCOUNTER — Other Ambulatory Visit: Payer: Self-pay | Admitting: Psychiatry

## 2019-08-24 NOTE — Telephone Encounter (Signed)
Patient suppose to be on 600 mg or 800 mg?

## 2019-09-14 DIAGNOSIS — E782 Mixed hyperlipidemia: Secondary | ICD-10-CM | POA: Diagnosis not present

## 2019-09-14 DIAGNOSIS — K219 Gastro-esophageal reflux disease without esophagitis: Secondary | ICD-10-CM | POA: Diagnosis not present

## 2019-09-14 DIAGNOSIS — R7303 Prediabetes: Secondary | ICD-10-CM | POA: Diagnosis not present

## 2019-09-14 DIAGNOSIS — N183 Chronic kidney disease, stage 3 unspecified: Secondary | ICD-10-CM | POA: Diagnosis not present

## 2019-09-14 DIAGNOSIS — E559 Vitamin D deficiency, unspecified: Secondary | ICD-10-CM | POA: Diagnosis not present

## 2019-09-14 DIAGNOSIS — E039 Hypothyroidism, unspecified: Secondary | ICD-10-CM | POA: Diagnosis not present

## 2019-09-21 ENCOUNTER — Other Ambulatory Visit: Payer: Self-pay

## 2019-09-21 ENCOUNTER — Ambulatory Visit (INDEPENDENT_AMBULATORY_CARE_PROVIDER_SITE_OTHER): Payer: PPO | Admitting: Psychiatry

## 2019-09-21 ENCOUNTER — Encounter: Payer: Self-pay | Admitting: Psychiatry

## 2019-09-21 DIAGNOSIS — F900 Attention-deficit hyperactivity disorder, predominantly inattentive type: Secondary | ICD-10-CM | POA: Diagnosis not present

## 2019-09-21 DIAGNOSIS — F3162 Bipolar disorder, current episode mixed, moderate: Secondary | ICD-10-CM

## 2019-09-21 DIAGNOSIS — F411 Generalized anxiety disorder: Secondary | ICD-10-CM | POA: Diagnosis not present

## 2019-09-21 NOTE — Progress Notes (Signed)
Reginald Tucker [id number] [birth year] 63 y.o.   Subjective:   Patient ID:  Reginald Tucker is a 63 y.o. (DOB 07/26/1956) male.  Chief Complaint:  Chief Complaint  Patient presents with  . Follow-up  . Depression    Anxiety Symptoms include nervous/anxious behavior. Patient reports no confusion, decreased concentration or suicidal ideas.    Depression        Associated symptoms include fatigue.  Associated symptoms include no decreased concentration, no headaches and no suicidal ideas.  Past medical history includes anxiety.    Reginald Tucker presents to the office today for follow-up of TRD and anxiety.  Seen alone today.  Step son moving to an apt.  When seen April 06, 2019.  He had not seen any mood benefit from low-dose pramipexole and had side effects at higher dosages.  Therefore we weaned him off of the that medication.  Last visit August 07, 2019 and the following changes were made: Option retry Vraylar with selegiline which wasn't adequately done before.  Prior trial inadequate duration and SE Retry Vraylar at a lower dosage to prevent tremors: 1 capsule every Monday, Wednesday, Friday only. Made him jittery and stopped after a week.  Didn't see mood benefit.  Still irritable and real tired.  Easily fatigued with normal activity.  Appt with PCP Oct 27 to evaluate. Not much tremor.   Waves of depression worse in the morning.  Sleeping too much in recliner and lethargic.  Deep sleep.  Takes 60 min to fall asleep.  Cant' shut off his brain and hard to fall asleep.  Reads a lot and enjoys it especially history.   Covid still makes me angry.  Virus has shut [place] down.    Pt reports that mood is Anxious, Depressed and Irritable rated 7/10 bad and describes anxiety as Moderate and occassional.  Less anxiety this visit than depression..  Staying in the house is making it worse too.   Anxiety symptoms include: Excessive Worry, Panic Symptoms, Social Anxiety,. Has claustrophobia.   Hard to ride elevators .  Can't go up the steps here.  Pt reports no sleep issues. Pt reports that appetite is good. Pt reports that energy is poor and anhedonia, loss of interest or pleasure in usual activities, poor motivation and withdrawn from usual activities. Concentration is down slightly. Suicidal thoughts:  denied by patient.  Still has days that does very little.  Will clean occ.   Patient is highly claustrophobic and states is getting worse.  He is having trouble using the elevator to get to our office.  He asked about finding another psychiatrist who has an office on the first floor and we discussed options.  Sleep study negative for OSA noted on chart.  Never tried the Adderall bc they were afraid of the possible SE of mood swings.    Past Psychiatric Medication Trials: He has had multiple psych med failures as well .  Past psychiatric medications used include pramipexole NR, sertraline, Trintellix, Vraylar 3mg  akathisia, 1.5 mg MWF jittery , lamotrigine, buspirone, lithium with a tremor, Depakote was side effects of feeling heavy and increased ammonia, risperidone, olanzapine, Wellbutrin, Seroquel 800 mg a day, Latuda 120 mg a day, carbamazepine, Rexulti, Paxil, perphenazine, modafinil, selegiline max 30mg  daily, Light therapy failure. Pramipexole looked helpful but pt didn't think so and stopped.  No history of stimulant  Review of Systems:  Review of Systems  Constitutional: Positive for fatigue.  Musculoskeletal: Positive for back pain.  Neurological: Negative for  tremors, weakness and headaches.  Psychiatric/Behavioral: Positive for depression and dysphoric mood. Negative for agitation, behavioral problems, confusion, decreased concentration, hallucinations, self-injury, sleep disturbance and suicidal ideas. The patient is nervous/anxious. The patient is not hyperactive.   Occurs 2-3 times/weekre: leg weakness.  Not orthostatic.  Medications: I have reviewed the patient's  current medications.  Current Outpatient Medications  Medication Sig Dispense Refill  . aspirin EC 81 MG tablet Take 81 mg by mouth daily.    . Cholecalciferol (VITAMIN D3 PO) Take 15,000 Units by mouth daily.     Marland Kitchen L-THEANINE PO Take by mouth.    . lamoTRIgine (LAMICTAL) 100 MG tablet TAKE 2 TABLETS(200 MG) BY MOUTH TWICE DAILY 360 tablet 0  . levothyroxine (SYNTHROID, LEVOTHROID) 150 MCG tablet Take 150 mcg by mouth daily before breakfast.    . lovastatin (MEVACOR) 40 MG tablet Take 40 mg by mouth daily.     Marland Kitchen MAGNESIUM CITRATE PO Take 1-2 tablets by mouth 2 (two) times daily. 400 mg, 2 tabs in the morning, 1 tab at bedtime    . Methylcobalamin 1 MG CHEW Chew 1 tablet by mouth daily.    Marland Kitchen ofloxacin (OCUFLOX) 0.3 % ophthalmic solution Instill 1 drop into the right eye every 3 hours-  0  . Omega-3 Fatty Acids (SUPER OMEGA 3 PO) Take 1 capsule by mouth 2 (two) times daily.     Marland Kitchen omeprazole (PRILOSEC) 20 MG capsule Take 20 mg by mouth daily.    Marland Kitchen OVER THE COUNTER MEDICATION Place 1 Squirt under the tongue at bedtime. Hemp oil  500 mg    . QUEtiapine (SEROQUEL) 400 MG tablet Take 1.5 tablets (600 mg total) by mouth at bedtime. 135 tablet 0  . risperiDONE (RISPERDAL) 1 MG tablet TAKE 1 TABLET(1 MG) BY MOUTH TWICE DAILY 180 tablet 0  . selegiline (ELDEPRYL) 5 MG tablet Take 4 tablets (20 mg total) by mouth 2 (two) times daily with a meal. AM dose and second dose 4 hours later (Patient taking differently: Take 10 mg by mouth 2 (two) times daily with a meal. AM dose and second dose 4 hours later ) 240 tablet 0   No current facility-administered medications for this visit.     Medication Side Effects:got nervous and irritable with 1.5 mg pramipexole in morning, ok with it split up and no change in mood.  Allergies:  Allergies  Allergen Reactions  . Sulfa Antibiotics Other (See Comments)    UNSPECIFIED REACTION OF CHILDHOOD  . Sulfamethoxazole Other (See Comments)    UNSPECIFIED REACTION OF  CHILDHOOD  . Zolpidem Tartrate Anxiety      Nervous, uncontrollable    Past Medical History:  Diagnosis Date  . Acquired hallux rigidus of right foot 04/26/2017  . AKI (acute kidney injury) ([place]) 06/11/2017  . Anxiety   . Bipolar 1 disorder ([place])   . BMI 37.0-37.9, adult   . BPH (benign prostatic hyperplasia)   . Cataract   . Cataract    L eye  . CKD (chronic kidney disease), stage III   . Colon polyps   . COPD GOLD II with restrictive component  01/13/2016   Spirometry 01/13/2016  FEV1 1.84 (47%)  Ratio 62  - 01/13/2016  extensive coaching HFA effectiveness =    90% > try stiolto respimat 2 pffs each am > did not benefit so stopped when sample out - 01/13/2016  Walked RA x 3 laps @ 185 ft each stopped due to  End of study, nl pace, no  desat  / min sob  - PFT's  03/16/2016  FEV1 2.28 (59 % ) ratio 67  p 12 % improvement from saba p no prior to study with DLCO  66 % corrects to 86 % for alv volume     . Depression   . Dysrhythmia   . Essential hypertension 01/19/2015  . Family history of coronary arteriosclerosis 01/19/2015   Father with MI   . Fatty liver   . GERD (gastroesophageal reflux disease) 08/11/2014  . History of colon polyps 06/18/2017  . Hyperlipidemia   . Hypertension   . Hypothyroidism 08/11/2014  . Hypothyroidism   . Insomnia 06/18/2017  . Insomnia   . Memory change   . Mixed hyperlipidemia 06/18/2017  . Morbid obesity ([place]) [id number]   Complicated by HBP/ Low erv on pfts 03/16/2016 (31%)    . Morbid obesity due to excess calories ([place])   . Prediabetes   . Sleep apnea   . SOB (shortness of breath)   . Thyroid disease   . Tinnitus of both ears 06/18/2017  . Tobacco use 06/18/2017  . Unsteadiness on feet   . Vitamin D deficiency 06/18/2017  . Vitamin D deficiency     Family History  Problem Relation Age of Onset  . Cancer Mother   . Hyperlipidemia Father   . Hypertension Father   . CAD Father   . Stroke Father   . Aortic aneurysm Father   . Prostate cancer Father   .  Hyperlipidemia Brother   . Appendicitis Maternal Grandfather     Social History   Socioeconomic History  . Marital status: Married    Spouse name: Not on file  . Number of children: Not on file  . Years of education: Not on file  . Highest education level: Associate degree: occupational, Hotel manager, or vocational program  Occupational History  . Occupation: act.  assist  Social Needs  . Financial resource strain: Not on file  . Food insecurity    Worry: Not on file    Inability: Not on file  . Transportation needs    Medical: Not on file    Non-medical: Not on file  Tobacco Use  . Smoking status: Former Smoker    Packs/day: 1.00    Years: 25.00    Pack years: 25.00    Types: Cigarettes    Quit date: 12/10/2000    Years since quitting: 18.7  . Smokeless tobacco: Never Used  . Tobacco comment: heavy vape user- quit vaping 06/04/2017  Substance and Sexual Activity  . Alcohol use: No    Alcohol/week: 0.0 standard drinks  . Drug use: No  . Sexual activity: Not on file  Lifestyle  . Physical activity    Days per week: Not on file    Minutes per session: Not on file  . Stress: Not on file  Relationships  . Social Herbalist on phone: Not on file    Gets together: Not on file    Attends religious service: Not on file    Active member of club or organization: Not on file    Attends meetings of clubs or organizations: Not on file    Relationship status: Not on file  . Intimate partner violence    Fear of current or ex partner: Not on file    Emotionally abused: Not on file    Physically abused: Not on file    Forced sexual activity: Not on file  Other Topics Concern  .  Not on file  Social History Narrative   Admitted to [facility] 06/14/17- discharged   Lives at home with his wife   Married - [name]   Former smoker - stopped 2002   Alcohol none   Full code   Right handed   Drinks 2 cups of caffeine daily    Past Medical History, Surgical history, Social  history, and Family history were reviewed and updated as appropriate.   Please see review of systems for further details on the patient's review from today.   Objective:   Physical Exam:  There were no vitals taken for this visit.  Physical Exam Constitutional:      General: He is not in acute distress.    Appearance: He is well-developed. He is obese.  Musculoskeletal:        General: No deformity.  Neurological:     Mental Status: He is alert and oriented to person, place, and time.     Motor: No tremor.     Coordination: Coordination normal.     Gait: Gait normal.  Psychiatric:        Attention and Perception: He is attentive. He does not perceive auditory hallucinations.        Mood and Affect: Mood is anxious and depressed. Affect is not labile, blunt, angry or inappropriate.        Speech: Speech normal.        Behavior: Behavior is not agitated or hyperactive.        Thought Content: Thought content normal. Thought content is not paranoid. Thought content does not include homicidal or suicidal ideation. Thought content does not include homicidal or suicidal plan.        Cognition and Memory: He exhibits impaired recent memory.     Comments: Insight and judgment fair-poor. No auditory or visual hallucinations. No delusions.  Not irritable in office.     Lab Review:     Component Value Date/Time   NA 140 08/13/2018 1020   K 4.3 08/13/2018 1020   CL 107 08/13/2018 1020   CO2 26 08/13/2018 1020   GLUCOSE 99 08/13/2018 1020   BUN 13 08/13/2018 1020   CREATININE 1.14 08/13/2018 1020   CALCIUM 9.4 08/13/2018 1020   PROT 6.9 02/11/2018 0623   ALBUMIN 4.2 02/11/2018 0623   AST 25 02/11/2018 0623   ALT 33 02/11/2018 0623   ALKPHOS 89 02/11/2018 0623   BILITOT 0.9 02/11/2018 0623   GFRNONAA >60 08/13/2018 1020   GFRAA >60 08/13/2018 1020       Component Value Date/Time   WBC 6.8 08/13/2018 1020   RBC 5.29 08/13/2018 1020   HGB 16.5 08/13/2018 1020   HCT 50.5  08/13/2018 1020   PLT 192 08/13/2018 1020   MCV 95.5 08/13/2018 1020   MCH 31.2 08/13/2018 1020   MCHC 32.7 08/13/2018 1020   RDW 12.0 08/13/2018 1020   LYMPHSABS 1.3 08/13/2018 1020   MONOABS 0.7 08/13/2018 1020   EOSABS 0.1 08/13/2018 1020   BASOSABS 0.1 08/13/2018 1020    No results found for: POCLITH, LITHIUM   No results found for: PHENYTOIN, PHENOBARB, VALPROATE, CBMZ   .res Assessment: Plan:    Bipolar 1 disorder, mixed, moderate (HCC)  Generalized anxiety disorder  Attention deficit hyperactivity disorder (ADHD), predominantly inattentive type   Severe TRD and anxiety. Was good after surgery July to December and then relapsed.   Greater than 50% of face to face time with patient was spent on counseling and coordination  of care.  Affectively better  after adding pramipexole to 0.5 mg twice daily and increasing Seroquel to 600 mg daily.  He was much more spontaneously talkative and attentive.  He also displayed humor which is unusual.  He was not irritable in the office .  It is noteworthy that he was seen alone today that may have affected his behavior.  He did not look overtly manic.  However at follow-up the patient felt that the pramipexole was not really helping and it was discontinued.  Chart was reviewed immediately after his last visit and the decision was made to retry Vraylar which had not been used in combination with selegiline which might improve its effectiveness.  He had problems with tremor and stopped it.  He did not tolerate Vraylar even at 1.5 mg 3 days a week.  Discussed potential metabolic side effects associated with atypical antipsychotics, as well as potential risk for movement side effects. Advised pt to contact office if movement side effects occur.   Polypharmacy is a real potential problem but appears unavoidable at this time.  Particularly the combination of dopamine agonists and dopamine antagonists is not ideal but given that he does not respond  to any of the traditional mood stabilizers that are not atypicals this is the result.  Consider amanatadine for the same reason.  Other options Saphris.  Option trial Ritalin.  Discussed the risk of mania and irritability and he prefers not to pursue that option at this time.  Encouraged him to get a second opinion.  We discussed 3 different practices where he could seek an opinion.  He may or may not choose to change practices but may do so in part because of difficulty riding the elevator.  It is of value to consider a second opinion because of his treatment resistant bipolar depression as well.  No med changes today as the patient is expecting to seek a second opinion.  Greater than 50% of 40 min face to face time with patient was spent on counseling and coordination of care. This appt was 30 mins.  FU 3-4 mos  Lynder Parents, MD, DFAPA    No future appointments.  No orders of the defined types were placed in this encounter.     -------------------------------

## 2019-09-21 NOTE — Patient Instructions (Addendum)
Option for psychiatrists  Lulu Riding MD  [place] for example, Dr. Dwyane Dee  [facility]

## 2019-09-28 ENCOUNTER — Other Ambulatory Visit: Payer: Self-pay | Admitting: Psychiatry

## 2019-09-29 ENCOUNTER — Other Ambulatory Visit: Payer: Self-pay

## 2019-09-29 ENCOUNTER — Other Ambulatory Visit: Payer: Self-pay | Admitting: Psychiatry

## 2019-09-29 MED ORDER — SELEGILINE HCL 5 MG PO TABS: 20.0000 mg | ORAL_TABLET | Freq: Two times a day (BID) | ORAL | 0 refills | Status: DC

## 2019-10-06 DIAGNOSIS — E039 Hypothyroidism, unspecified: Secondary | ICD-10-CM | POA: Diagnosis not present

## 2019-10-06 DIAGNOSIS — E559 Vitamin D deficiency, unspecified: Secondary | ICD-10-CM | POA: Diagnosis not present

## 2019-10-06 DIAGNOSIS — N183 Chronic kidney disease, stage 3 unspecified: Secondary | ICD-10-CM | POA: Diagnosis not present

## 2019-10-06 DIAGNOSIS — E782 Mixed hyperlipidemia: Secondary | ICD-10-CM | POA: Diagnosis not present

## 2019-10-06 DIAGNOSIS — R7303 Prediabetes: Secondary | ICD-10-CM | POA: Diagnosis not present

## 2019-10-08 DIAGNOSIS — K219 Gastro-esophageal reflux disease without esophagitis: Secondary | ICD-10-CM | POA: Diagnosis not present

## 2019-10-08 DIAGNOSIS — R1310 Dysphagia, unspecified: Secondary | ICD-10-CM | POA: Diagnosis not present

## 2019-10-08 DIAGNOSIS — R1314 Dysphagia, pharyngoesophageal phase: Secondary | ICD-10-CM | POA: Diagnosis not present

## 2019-10-12 ENCOUNTER — Other Ambulatory Visit: Payer: Self-pay | Admitting: Otolaryngology

## 2019-10-12 DIAGNOSIS — K219 Gastro-esophageal reflux disease without esophagitis: Secondary | ICD-10-CM

## 2019-10-12 DIAGNOSIS — R1310 Dysphagia, unspecified: Secondary | ICD-10-CM

## 2019-10-22 ENCOUNTER — Ambulatory Visit
Admission: RE | Admit: 2019-10-22 | Discharge: 2019-10-22 | Disposition: A | Discharge status: Home / Self Care | Payer: PPO | Source: Ambulatory Visit | Attending: Otolaryngology | Admitting: Otolaryngology

## 2019-10-22 DIAGNOSIS — K219 Gastro-esophageal reflux disease without esophagitis: Secondary | ICD-10-CM

## 2019-10-22 DIAGNOSIS — K228 Other specified diseases of esophagus: Secondary | ICD-10-CM | POA: Diagnosis not present

## 2019-10-22 DIAGNOSIS — R1310 Dysphagia, unspecified: Secondary | ICD-10-CM

## 2019-10-25 ENCOUNTER — Other Ambulatory Visit: Payer: Self-pay | Admitting: Psychiatry

## 2019-11-12 DIAGNOSIS — M545 Low back pain: Secondary | ICD-10-CM | POA: Diagnosis not present

## 2019-11-20 ENCOUNTER — Other Ambulatory Visit: Payer: Self-pay | Admitting: Psychiatry

## 2019-12-07 DIAGNOSIS — E039 Hypothyroidism, unspecified: Secondary | ICD-10-CM | POA: Diagnosis not present

## 2019-12-24 ENCOUNTER — Other Ambulatory Visit: Payer: Self-pay | Admitting: Psychiatry

## 2020-01-07 DIAGNOSIS — E039 Hypothyroidism, unspecified: Secondary | ICD-10-CM | POA: Diagnosis not present

## 2020-01-07 DIAGNOSIS — E559 Vitamin D deficiency, unspecified: Secondary | ICD-10-CM | POA: Diagnosis not present

## 2020-01-07 DIAGNOSIS — R7303 Prediabetes: Secondary | ICD-10-CM | POA: Diagnosis not present

## 2020-01-07 DIAGNOSIS — K219 Gastro-esophageal reflux disease without esophagitis: Secondary | ICD-10-CM | POA: Diagnosis not present

## 2020-01-07 DIAGNOSIS — F319 Bipolar disorder, unspecified: Secondary | ICD-10-CM | POA: Diagnosis not present

## 2020-01-07 DIAGNOSIS — E782 Mixed hyperlipidemia: Secondary | ICD-10-CM | POA: Diagnosis not present

## 2020-01-20 ENCOUNTER — Other Ambulatory Visit: Payer: Self-pay | Admitting: Psychiatry

## 2020-02-04 IMAGING — MR MR HEAD W/O CM
11 of 21 series · 24 of 48 positions shown · IV contrast (Yes MH)
Comparison: Orbit radiographs 08/08/2017. Lumbar spine radiographs
01/16/2018. CT Abdomen 11/13/2017.

CLINICAL DATA: 61-year-old male with stumbling in falls beginning a
few years ago, progressive symptoms. Unclear etiology. Falls
reportedly associated with lower extremity weakness.

MRI studies today are performed under general anesthesia.
EXAM:
MRI HEAD WITHOUT CONTRAST
MRI CERVICAL SPINE WITHOUT CONTRAST
MRI LUMBAR SPINE WITHOUT CONTRAST
TECHNIQUE: Multiplanar, multiecho pulse sequences of the brain and surrounding
structures, cervical spine, and lumbar spine were obtained without
intravenous contrast.

[Series 3: DWI · axial · 3.0mm · 0.94mm/px · z∈[-91,+68]mm · 5 of 108 slices shown (1 of 2)]
[im 1/108]
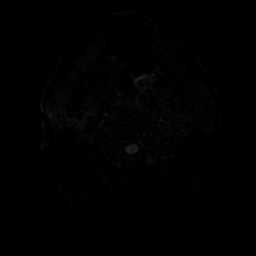
[im 27/108]
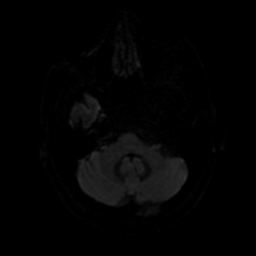
[im 54/108]
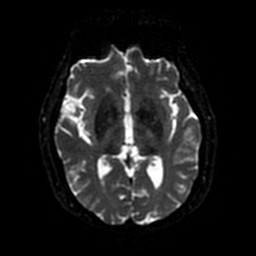
[im 81/108]
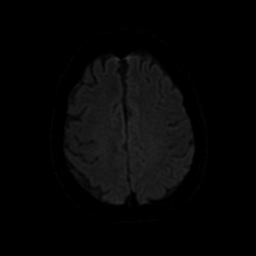
[im 108/108]
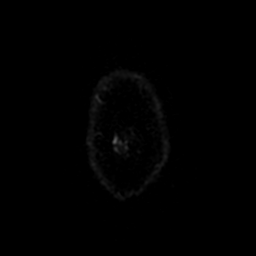

[Series 4: DWI · coronal · 4.0mm · 0.94mm/px · 4 of 72 slices shown (2 of 2)]
[im 1/72]
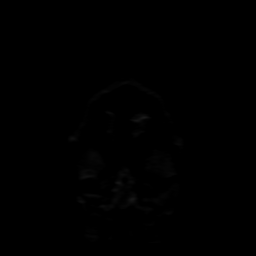
[im 24/72]
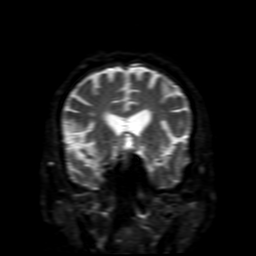
[im 48/72]
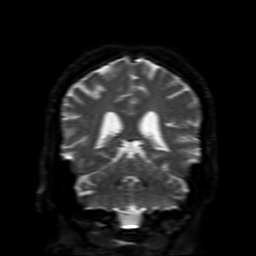
[im 72/72]
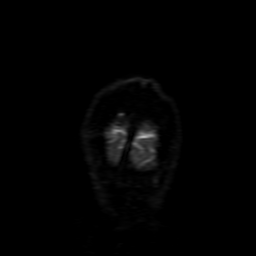

[Series 5: FLAIR · sagittal · 5.0mm · 0.47mm/px · 1 of 24 slices shown (1 of 3)]
[im 1/24]
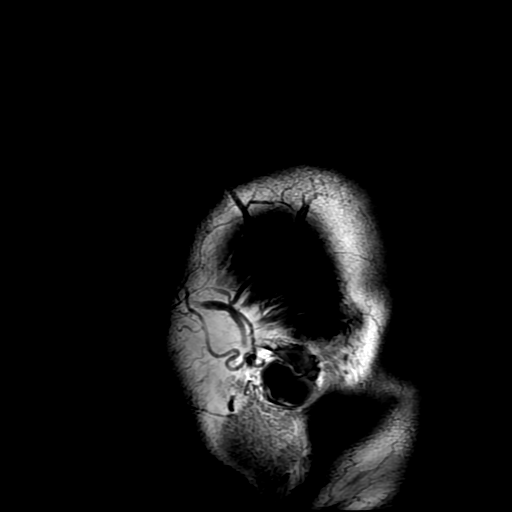

[Series 6: T2 · axial · 5.0mm · 0.47mm/px · z∈[-97,+70]mm · 2 of 29 slices shown (1 of 3)]
[im 1/29]
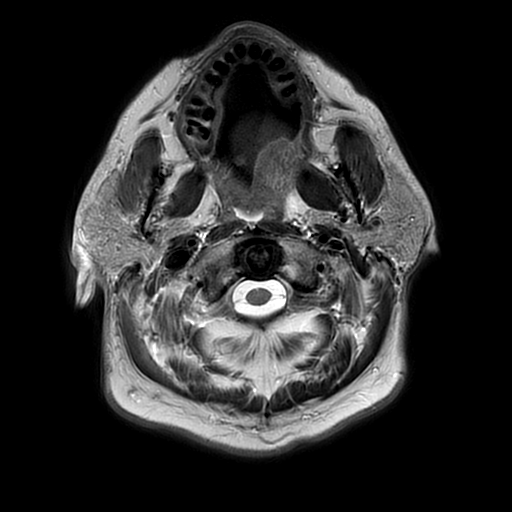
[im 29/29]
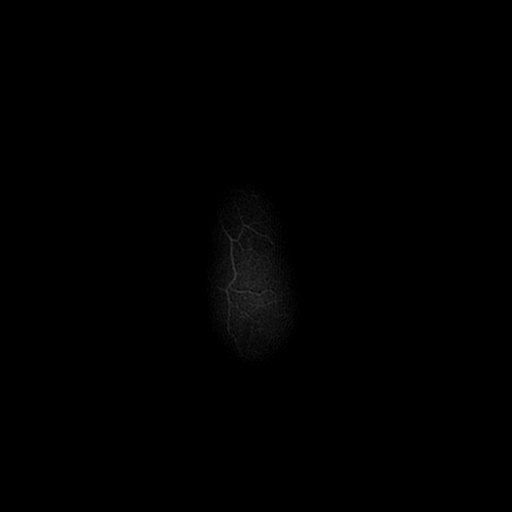

[Series 7: FLAIR · axial · 5.0mm · 0.47mm/px · z∈[-97,+70]mm · 2 of 29 slices shown (2 of 3)]
[im 1/29]
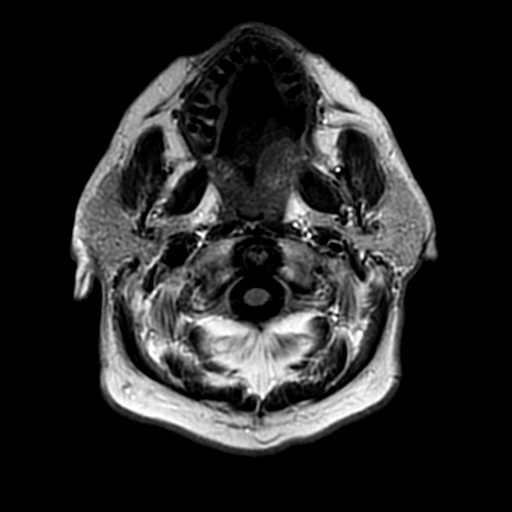
[im 29/29]
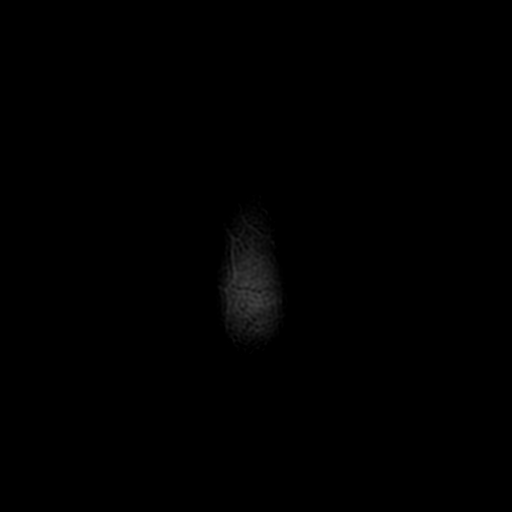

[Series 10: T2 post-contrast · coronal · 5.0mm · 0.39mm/px · 2 of 30 slices shown]
[im 1/30]
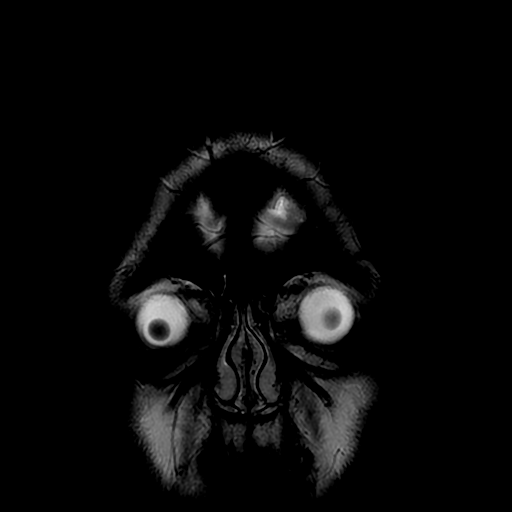
[im 30/30]
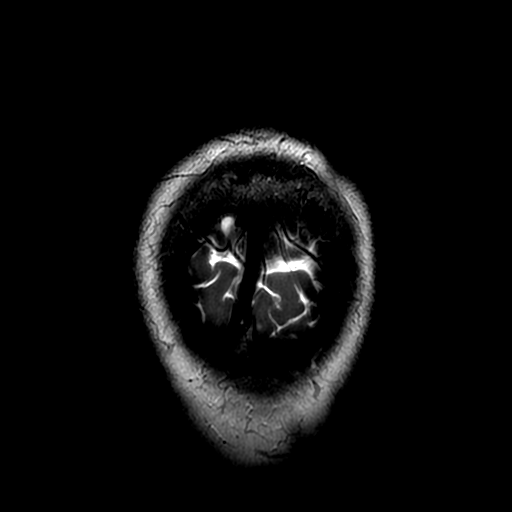

[Series 12: T2 · sagittal · 3.0mm · 0.43mm/px · 1 of 16 slices shown (2 of 3)]
[im 1/16]
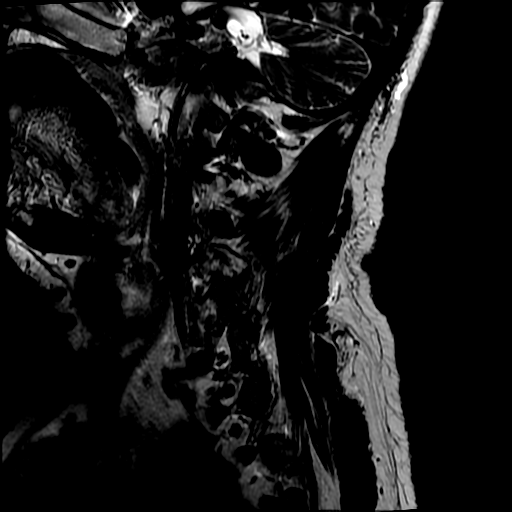

[Series 13: FLAIR · sagittal · 3.0mm · 0.43mm/px · 1 of 16 slices shown (3 of 3)]
[im 1/16]
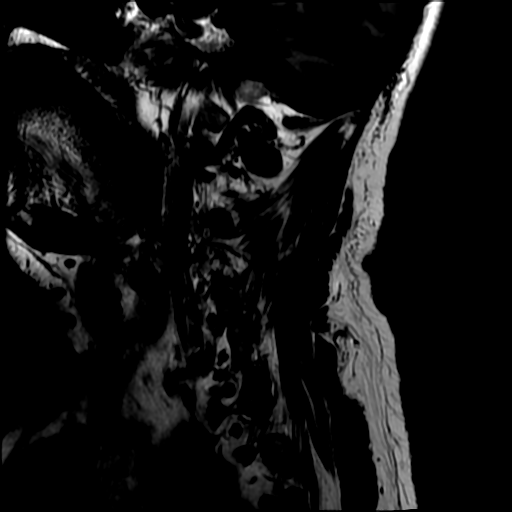

[Series 16: T2 · axial · 3.0mm · 0.35mm/px · 1 of 29 slices shown (3 of 3)]
[im 1/29]
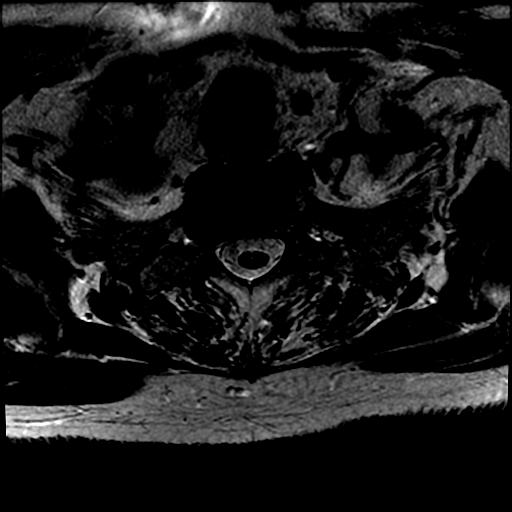

[Series 350: ADC · axial · 3.0mm · 0.94mm/px · z∈[-91,+68]mm · 3 of 53 slices shown (1 of 2)]
[im 1/53]
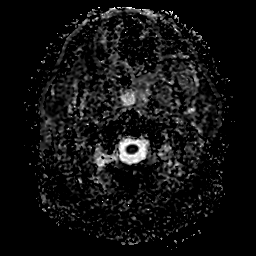
[im 27/53]
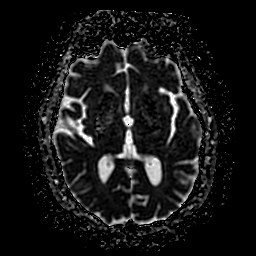
[im 53/53]
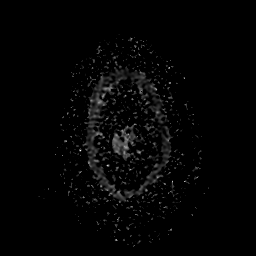

[Series 450: ADC · coronal · 4.0mm · 0.94mm/px · 2 of 36 slices shown (2 of 2)]
[im 1/36]
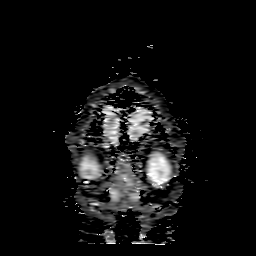
[im 36/36]
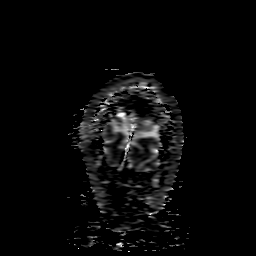

[24 of 48 positions shown; findings below may reference images not displayed]

FINDINGS: MRI HEAD FINDINGS

Brain: Cerebral volume is within normal limits for age. No
restricted diffusion to suggest acute infarction. No midline shift,
mass effect, evidence of mass lesion, ventriculomegaly, extra-axial
collection or acute intracranial hemorrhage. Cervicomedullary
junction and pituitary are within normal limits.

There is a chronic microhemorrhage in the lateral right thalamus on
series 8, image 50. There may be additional chronic micro
hemorrhages in the bilateral basal ganglia on that series, but
otherwise no additional chronic cerebral blood products are
identified. Superimposed minimal to mild for age scattered small
nonspecific cerebral white matter T2 and FLAIR hyperintense foci,
mostly in the anterior frontal lobes as on series 7, image 19. No
cortical encephalomalacia. The brainstem and cerebellum are normal.

Vascular: Major intracranial vascular flow voids are preserved. Mild
tortuosity of the distal right vertebral artery.

Skull and upper cervical spine: Cervical spine is detailed below.
Normal bone marrow signal.

Sinuses/Orbits: Normal orbits soft tissues. There is a 2 cm mucous
retention cyst in the right maxillary sinus, the other paranasal
sinuses are clear.

Other: Small volume of fluid layering in the pharynx related to
general anesthesia. Mastoids are clear. Visible internal auditory
structures appear normal. Scalp and face soft tissues appear
negative.

MRI CERVICAL SPINE FINDINGS

Alignment: Straightening of lower cervical lordosis. 1-2 millimeters
of anterolisthesis of C4 on C5.

Vertebrae: Evidence of bilateral C4-C5 posterior element ankylosis.
Chronic degenerative marrow signal changes in those facets and
elsewhere in the cervical spine, especially the lower cervical
endplates. No marrow edema or evidence of acute osseous abnormality.

Cord: Capacious spinal canal. Spinal cord signal is within normal
limits at all visualized levels.

Posterior Fossa, vertebral arteries, paraspinal tissues: Intubated
with associated small volume of fluid in the pharynx. Major vascular
flow voids in the neck are preserved. Negative bilateral neck soft
tissues.

Disc levels:

C2-C3: Right facet joint ankylosis is evident. There is mild to
moderate right facet hypertrophy. Mild left facet hypertrophy. No
spinal stenosis. There is mild left C3 foraminal stenosis.

C3-C4: Severe bilateral facet hypertrophy. Degenerative subchondral
cysts suspected on the left. Negative disc but mild endplate
spurring. Severe left and mild to moderate right C4 foraminal
stenosis primarily due to the posterior element disease.

C4-C5: Trace anterolisthesis with bilateral facet ankylosis and
hypertrophy. Negative disc. Borderline to mild bilateral C5
foraminal stenosis related to the facet enlargement.

C5-C6: Disc space loss. Bulky circumferential disc osteophyte
complex, but mostly directed anteriorly. Broad-based posterior and
biforaminal components. Mild ligament flavum and facet hypertrophy.
No spinal stenosis. Moderate to severe bilateral C6 foraminal
stenosis.

C6-C7: Disc space loss. Bulky circumferential disc osteophyte
complex, mostly directed anteriorly. Broad-based posterior and left
greater than right foraminal component. Mild facet and ligament
flavum hypertrophy. No spinal stenosis. Moderate to severe left and
mild right C7 neural foraminal stenosis.

C7-T1: Disc space loss. Bulky circumferential disc osteophyte
complex with broad-based posterior and biforaminal components. No
spinal stenosis. Moderate to severe left and mild to moderate right
C8 neural foraminal stenosis.

T1-T2: Negative.  No upper thoracic spinal stenosis.

MRI LUMBAR SPINE FINDINGS

Segmentation:  Normal on the comparisons.

Alignment:  Stable and normal lumbar vertebral height and alignment.

Vertebrae: Visualized bone marrow signal is within normal limits. No
marrow edema or evidence of acute osseous abnormality.

Spinal cord/conus: The conus medullaris terminates above the mid T12
level. The cauda equina nerve roots appear normal.

Paraspinal and other soft tissues: Stable abdominal viscera compared
to the November 2017 abdomen CT. Multiple benign appearing renal
cysts, larger and more numerous on the right. Nonspecific bilateral
perinephric stranding. Diverticulosis of the large bowel. Negative
visualized posterior paraspinal soft tissues.

Disc levels:

T12-L1:  Negative.

L1-L2:  Negative.

L2-L3: Disc space loss. Circumferential disc bulge with endplate
spurring. Broad-based bilateral foraminal involvement. Mild facet
and ligament flavum hypertrophy. No spinal or convincing lateral
recess stenosis. Borderline to mild right L2 foraminal stenosis.

L3-L4: Mild far lateral disc bulging and endplate spurring. Minimal
to mild facet hypertrophy. No significant stenosis.

L4-L5: Disc desiccation. Mild circumferential disc bulge. Small
central to slightly left paracentral annular fissure of the disc
(series 22, image 32). Mild to moderate facet hypertrophy. No
stenosis.

L5-S1: Far lateral disc bulging and endplate spurring greater on the
right. Subsequent mild right L5 neural foraminal stenosis.
IMPRESSION: BRAIN:

1.  No acute intracranial abnormality.

2. Evidence of chronic small vessel disease in the form of several
chronic micro hemorrhages in the deep gray matter nuclei, but
otherwise largely unremarkable for age noncontrast MRI appearance of
the brain.

CERVICAL SPINE:

1. Widespread advanced cervical spine degeneration, although
capacious spinal canal without spinal stenosis. Normal cervical and
visible upper thoracic spinal cord.
2. Key degenerative features include:
-Posterior element ankylosis at C2-C3 and C4-C5 with intervening
severe facet degeneration at C3-C4.
-advanced chronic cervical disc and endplate degeneration C5-C6
through C7-T1.
3. Up to severe subsequent cervical neural foraminal stenosis at the
left C4, bilateral C6, left C7, and left C8 nerve levels.

LUMBAR SPINE:

1. Comparatively little lumbar spine degeneration. No lumbar spinal
or lateral recess stenosis.
2. Up to mild multifactorial neural foraminal stenosis at the right
L2 and right L5 nerve levels.
3. Stable visible abdomen compared to CT Abdomen 11/13/2017

## 2020-02-17 ENCOUNTER — Other Ambulatory Visit: Payer: Self-pay | Admitting: Psychiatry

## 2020-03-25 ENCOUNTER — Other Ambulatory Visit: Payer: Self-pay | Admitting: Psychiatry

## 2020-04-21 ENCOUNTER — Other Ambulatory Visit: Payer: Self-pay | Admitting: Psychiatry

## 2020-05-13 ENCOUNTER — Other Ambulatory Visit: Payer: Self-pay | Admitting: Psychiatry

## 2020-05-16 NOTE — Telephone Encounter (Signed)
Last apt 09/2019, no follow up scheduled

## 2020-05-31 DIAGNOSIS — D225 Melanocytic nevi of trunk: Secondary | ICD-10-CM | POA: Diagnosis not present

## 2020-05-31 DIAGNOSIS — H61002 Unspecified perichondritis of left external ear: Secondary | ICD-10-CM | POA: Diagnosis not present

## 2020-05-31 DIAGNOSIS — L218 Other seborrheic dermatitis: Secondary | ICD-10-CM | POA: Diagnosis not present

## 2020-05-31 DIAGNOSIS — L821 Other seborrheic keratosis: Secondary | ICD-10-CM | POA: Diagnosis not present

## 2020-06-20 ENCOUNTER — Other Ambulatory Visit: Payer: Self-pay | Admitting: Psychiatry

## 2020-06-21 NOTE — Telephone Encounter (Signed)
Schedule apt

## 2020-07-07 DIAGNOSIS — E039 Hypothyroidism, unspecified: Secondary | ICD-10-CM | POA: Diagnosis not present

## 2020-07-07 DIAGNOSIS — E782 Mixed hyperlipidemia: Secondary | ICD-10-CM | POA: Diagnosis not present

## 2020-07-07 DIAGNOSIS — I10 Essential (primary) hypertension: Secondary | ICD-10-CM | POA: Diagnosis not present

## 2020-07-07 DIAGNOSIS — F319 Bipolar disorder, unspecified: Secondary | ICD-10-CM | POA: Diagnosis not present

## 2020-07-07 DIAGNOSIS — R7303 Prediabetes: Secondary | ICD-10-CM | POA: Diagnosis not present

## 2020-07-07 DIAGNOSIS — K219 Gastro-esophageal reflux disease without esophagitis: Secondary | ICD-10-CM | POA: Diagnosis not present

## 2020-07-07 DIAGNOSIS — Z125 Encounter for screening for malignant neoplasm of prostate: Secondary | ICD-10-CM | POA: Diagnosis not present

## 2020-07-07 DIAGNOSIS — R7309 Other abnormal glucose: Secondary | ICD-10-CM | POA: Diagnosis not present

## 2020-07-07 DIAGNOSIS — E559 Vitamin D deficiency, unspecified: Secondary | ICD-10-CM | POA: Diagnosis not present

## 2020-07-19 ENCOUNTER — Other Ambulatory Visit: Payer: Self-pay | Admitting: Psychiatry

## 2020-07-19 NOTE — Telephone Encounter (Signed)
Last apt 09/21/2019 hasn't scheduled f/up

## 2020-07-20 NOTE — Telephone Encounter (Signed)
Call to RS

## 2020-08-13 ENCOUNTER — Other Ambulatory Visit: Payer: Self-pay | Admitting: Psychiatry

## 2020-08-16 NOTE — Telephone Encounter (Signed)
Please review

## 2020-08-20 ENCOUNTER — Other Ambulatory Visit: Payer: Self-pay | Admitting: Psychiatry

## 2020-09-14 ENCOUNTER — Other Ambulatory Visit: Payer: Self-pay | Admitting: Psychiatry

## 2020-09-16 NOTE — Telephone Encounter (Signed)
1 year since last apt

## 2020-09-16 NOTE — Telephone Encounter (Signed)
Call to RS

## 2020-09-26 NOTE — Telephone Encounter (Signed)
LM for patient to call to schedule follow up in order to get further refills.

## 2020-10-07 ENCOUNTER — Other Ambulatory Visit: Payer: Self-pay | Admitting: Psychiatry

## 2020-10-14 ENCOUNTER — Other Ambulatory Visit: Payer: Self-pay | Admitting: Psychiatry

## 2020-10-14 NOTE — Telephone Encounter (Signed)
Call pt and or his wife bc he's overdue for appt, if he wants refills from [place].  Needs appt

## 2020-10-16 ENCOUNTER — Other Ambulatory Visit: Payer: Self-pay | Admitting: Psychiatry

## 2020-10-17 ENCOUNTER — Telehealth (INDEPENDENT_AMBULATORY_CARE_PROVIDER_SITE_OTHER): Payer: PPO | Admitting: Psychiatry

## 2020-10-17 ENCOUNTER — Encounter: Payer: Self-pay | Admitting: Psychiatry

## 2020-10-17 DIAGNOSIS — F338 Other recurrent depressive disorders: Secondary | ICD-10-CM | POA: Diagnosis not present

## 2020-10-17 DIAGNOSIS — F3162 Bipolar disorder, current episode mixed, moderate: Secondary | ICD-10-CM | POA: Diagnosis not present

## 2020-10-17 DIAGNOSIS — G3184 Mild cognitive impairment, so stated: Secondary | ICD-10-CM | POA: Diagnosis not present

## 2020-10-17 DIAGNOSIS — F411 Generalized anxiety disorder: Secondary | ICD-10-CM

## 2020-10-17 DIAGNOSIS — F900 Attention-deficit hyperactivity disorder, predominantly inattentive type: Secondary | ICD-10-CM | POA: Diagnosis not present

## 2020-10-17 MED ORDER — RISPERIDONE 1 MG PO TABS: ORAL_TABLET | ORAL | 1 refills | Status: DC

## 2020-10-17 MED ORDER — LAMOTRIGINE 100 MG PO TABS: 200.0000 mg | ORAL_TABLET | Freq: Two times a day (BID) | ORAL | 1 refills | Status: DC

## 2020-10-17 NOTE — Progress Notes (Signed)
Reginald Tucker [id number] 31-Aug-1956 64 y.o.   Subjective:   Patient ID:  Reginald Tucker is a 64 y.o. (DOB [birth year]) male.  Chief Complaint:  Chief Complaint  Patient presents with  . Follow-up  . Depression  . Anxiety    Anxiety Symptoms include nervous/anxious behavior. Patient reports no confusion, decreased concentration or suicidal ideas.    Depression        Associated symptoms include fatigue.  Associated symptoms include no decreased concentration, no headaches and no suicidal ideas.  Past medical history includes anxiety.    Reginald Tucker presents to the office today for follow-up of TRD and anxiety.  Seen alone today.  Step son moving to an apt.  When seen April 06, 2019.  He had not seen any mood benefit from low-dose pramipexole and had side effects at higher dosages.  Therefore we weaned him off of the that medication.  Last visit August 07, 2019 and the following changes were made: Option retry Vraylar with selegiline which wasn't adequately done before.  Prior trial inadequate duration and SE Retry Vraylar at a lower dosage to prevent tremors: 1 capsule every Monday, Wednesday, Friday only. Made him jittery and stopped after a week.  Didn't see mood benefit.  Still irritable and real tired.  Easily fatigued with normal activity.  Appt with PCP Oct 27 to evaluate. Not much tremor.   Waves of depression worse in the morning.  Sleeping too much in recliner and lethargic.  Deep sleep.  Takes 60 min to fall asleep.  Cant' shut off his brain and hard to fall asleep.  Reads a lot and enjoys it especially history.   Covid still makes me angry.  Virus has shut [place] down.    Pt reports that mood is Anxious, Depressed and Irritable rated 7/10 bad and describes anxiety as Moderate and occassional.  Less anxiety this visit than depression..  Staying in the house is making it worse too.   Anxiety symptoms include: Excessive Worry, Panic Symptoms, Social  Anxiety,. Has claustrophobia.  Hard to ride elevators .  Can't go up the steps here.  Pt reports no sleep issues. Pt reports that appetite is good. Pt reports that energy is poor and anhedonia, loss of interest or pleasure in usual activities, poor motivation and withdrawn from usual activities. Concentration is down slightly. Suicidal thoughts:  denied by patient.  Still has days that does very little.  Will clean occ.   Patient is highly claustrophobic and states is getting worse.  He is having trouble using the elevator to get to our office.  He asked about finding another psychiatrist who has an office on the first floor and we discussed options.  Sleep study negative for OSA noted on chart.  Never tried the Adderall bc they were afraid of the possible SE of mood swings.    Past Psychiatric Medication Trials: He has had multiple psych med failures as well .  Past psychiatric medications used include pramipexole NR, sertraline, Trintellix, Vraylar 3mg  akathisia, 1.5 mg MWF jittery , lamotrigine, buspirone, lithium with a tremor, Depakote was side effects of feeling heavy and increased ammonia, risperidone, olanzapine, Wellbutrin, Seroquel 800 mg a day, Latuda 120 mg a day, carbamazepine, Rexulti, Paxil, perphenazine, modafinil, selegiline max 30mg  daily, Light therapy failure. Pramipexole looked helpful but pt didn't think so and stopped.  No history of stimulant  Review of Systems:  Review of Systems  Constitutional: Positive for fatigue.  Musculoskeletal: Positive  for back pain.  Neurological: Negative for tremors, weakness and headaches.  Psychiatric/Behavioral: Positive for depression and dysphoric mood. Negative for agitation, behavioral problems, confusion, decreased concentration, hallucinations, self-injury, sleep disturbance and suicidal ideas. The patient is nervous/anxious. The patient is not hyperactive.   Occurs 2-3 times/weekre: leg weakness.  Not orthostatic.  Medications: I  have reviewed the patient's current medications.  Current Outpatient Medications  Medication Sig Dispense Refill  . aspirin EC 81 MG tablet Take 81 mg by mouth daily.    . Cholecalciferol (VITAMIN D3 PO) Take 15,000 Units by mouth daily.     Marland Kitchen L-THEANINE PO Take by mouth.    . lamoTRIgine (LAMICTAL) 100 MG tablet Take 2 tablets (200 mg total) by mouth 2 (two) times daily. 360 tablet 1  . levothyroxine (SYNTHROID, LEVOTHROID) 150 MCG tablet Take 150 mcg by mouth daily before breakfast.    . lovastatin (MEVACOR) 40 MG tablet Take 40 mg by mouth daily.     Marland Kitchen MAGNESIUM CITRATE PO Take 1-2 tablets by mouth 2 (two) times daily. 400 mg, 2 tabs in the morning, 1 tab at bedtime    . Methylcobalamin 1 MG CHEW Chew 1 tablet by mouth daily.    Marland Kitchen ofloxacin (OCUFLOX) 0.3 % ophthalmic solution Instill 1 drop into the right eye every 3 hours-  0  . Omega-3 Fatty Acids (SUPER OMEGA 3 PO) Take 1 capsule by mouth 2 (two) times daily.     Marland Kitchen omeprazole (PRILOSEC) 20 MG capsule Take 20 mg by mouth daily.    Marland Kitchen OVER THE COUNTER MEDICATION Place 1 Squirt under the tongue at bedtime. Hemp oil  500 mg    . QUEtiapine (SEROQUEL) 400 MG tablet Take 1.5 tablets (600 mg total) by mouth at bedtime. 45 tablet 0  . risperiDONE (RISPERDAL) 1 MG tablet TAKE 1 TABLET(1 MG) BY MOUTH TWICE DAILY 180 tablet 1  . selegiline (ELDEPRYL) 5 MG tablet TAKE 4 TABLETS BY MOUTH TWICE DAILY WITH A MEAL. AM DOSE AND SECOND DOSE 4 HOURS LATER (Patient taking differently: Take 10 mg by mouth 2 (two) times daily with a meal. ) 720 tablet 0   No current facility-administered medications for this visit.    Medication Side Effects:got nervous and irritable with 1.5 mg pramipexole in morning, ok with it split up and no change in mood.  Allergies:  Allergies  Allergen Reactions  . Sulfa Antibiotics Other (See Comments)    UNSPECIFIED REACTION OF CHILDHOOD  . Sulfamethoxazole Other (See Comments)    UNSPECIFIED REACTION OF CHILDHOOD  .  Zolpidem Tartrate Anxiety      Nervous, uncontrollable    Past Medical History:  Diagnosis Date  . Acquired hallux rigidus of right foot 04/26/2017  . AKI (acute kidney injury) ([place]) 06/11/2017  . Anxiety   . Bipolar 1 disorder ([place])   . BMI 37.0-37.9, adult   . BPH (benign prostatic hyperplasia)   . Cataract   . Cataract    L eye  . CKD (chronic kidney disease), stage III ([place])   . Colon polyps   . COPD GOLD II with restrictive component  01/13/2016   Spirometry 01/13/2016  FEV1 1.84 (47%)  Ratio 62  - 01/13/2016  extensive coaching HFA effectiveness =    90% > try stiolto respimat 2 pffs each am > did not benefit so stopped when sample out - 01/13/2016  Walked RA x 3 laps @ 185 ft each stopped due to  End of study, nl pace, no desat  /  min sob  - PFT's  03/16/2016  FEV1 2.28 (59 % ) ratio 67  p 12 % improvement from saba p no prior to study with DLCO  66 % corrects to 86 % for alv volume     . Depression   . Dysrhythmia   . Essential hypertension 01/19/2015  . Family history of coronary arteriosclerosis 01/19/2015   Father with MI   . Fatty liver   . GERD (gastroesophageal reflux disease) 08/11/2014  . History of colon polyps 06/18/2017  . Hyperlipidemia   . Hypertension   . Hypothyroidism 08/11/2014  . Hypothyroidism   . Insomnia 06/18/2017  . Insomnia   . Memory change   . Mixed hyperlipidemia 06/18/2017  . Morbid obesity ([place]) 0/01/5851   Complicated by HBP/ Low erv on pfts 03/16/2016 (31%)    . Morbid obesity due to excess calories ([place])   . Prediabetes   . Sleep apnea   . SOB (shortness of breath)   . Thyroid disease   . Tinnitus of both ears 06/18/2017  . Tobacco use 06/18/2017  . Unsteadiness on feet   . Vitamin D deficiency 06/18/2017  . Vitamin D deficiency     Family History  Problem Relation Age of Onset  . Cancer Mother   . Hyperlipidemia Father   . Hypertension Father   . CAD Father   . Stroke Father   . Aortic aneurysm Father   . Prostate cancer Father   .  Hyperlipidemia Brother   . Appendicitis Maternal Grandfather     Social History   Socioeconomic History  . Marital status: Married    Spouse name: Not on file  . Number of children: Not on file  . Years of education: Not on file  . Highest education level: Associate degree: occupational, Hotel manager, or vocational program  Occupational History  . 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 Smoking status: Former Smoker    Packs/day: 1.00    Years: 25.00    Pack years: 25.00    Types: Cigarettes    Quit date: 12/10/2000    Years since quitting: 19.8  . Smokeless tobacco: Never Used  . Tobacco comment: heavy vape user- quit vaping 06/04/2017  Vaping Use  . Vaping Use: Former  Substance and Sexual Activity  . Alcohol use: No    Alcohol/week: 0.0 standard drinks  . Drug use: No  . Sexual activity: Not on file  Other Topics Concern  . Not on file  Social History Narrative   Admitted to [facility] 06/14/17- discharged   Lives at home with his wife   Married - [name]   Former smoker - stopped 2002   Alcohol none   Full code   Right handed   Drinks 2 cups of caffeine daily   Social Determinants of Health   Financial Resource Strain:   . Difficulty of Paying Living Expenses: Not on file  Food Insecurity:   . Worried About Charity fundraiser in the Last Year: Not on file  . Ran Out of Food in the Last Year: Not on file  Transportation Needs:   . Lack of Transportation (Medical): Not on file  . Lack of Transportation (Non-Medical): Not on file  Physical Activity:   . Days of Exercise per Week: Not on file  . Minutes of Exercise per Session: Not on file  Stress:   . Feeling of Stress : Not on file  Social Connections:   . Frequency of Communication with  Friends and Family: Not on file  . Frequency of Social Gatherings with Friends and Family: Not on file  . Attends Religious Services: Not on file  . Active Member of Clubs or Organizations: Not on file  . Attends Theatre manager Meetings: Not on file  . Marital Status: Not on file  Intimate Partner Violence:   . Fear of Current or Ex-Partner: Not on file  . Emotionally Abused: Not on file  . Physically Abused: Not on file  . Sexually Abused: Not on file    Past Medical History, Surgical history, Social history, and Family history were reviewed and updated as appropriate.   Please see review of systems for further details on the patient's review from today.   Objective:   Physical Exam:  There were no vitals taken for this visit.  Physical Exam Constitutional:      General: He is not in acute distress.    Appearance: He is well-developed. He is obese.  Musculoskeletal:        General: No deformity.  Neurological:     Mental Status: He is alert and oriented to person, place, and time.     Motor: No tremor.     Coordination: Coordination normal.     Gait: Gait normal.  Psychiatric:        Attention and Perception: He is attentive. He does not perceive auditory hallucinations.        Mood and Affect: Mood is anxious and depressed. Affect is not labile, blunt, angry or inappropriate.        Speech: Speech normal.        Behavior: Behavior is not agitated or hyperactive.        Thought Content: Thought content normal. Thought content is not paranoid. Thought content does not include homicidal or suicidal ideation. Thought content does not include homicidal or suicidal plan.        Cognition and Memory: He exhibits impaired recent memory.     Comments: Insight and judgment fair-poor. No auditory or visual hallucinations. No delusions.  Not irritable in office.     Lab Review:     Component Value Date/Time   NA 140 08/13/2018 1020   K 4.3 08/13/2018 1020   CL 107 08/13/2018 1020   CO2 26 08/13/2018 1020   GLUCOSE 99 08/13/2018 1020   BUN 13 08/13/2018 1020   CREATININE 1.14 08/13/2018 1020   CALCIUM 9.4 08/13/2018 1020   PROT 6.9 02/11/2018 0623   ALBUMIN 4.2 02/11/2018 0623   AST  25 02/11/2018 0623   ALT 33 02/11/2018 0623   ALKPHOS 89 02/11/2018 0623   BILITOT 0.9 02/11/2018 0623   GFRNONAA >60 08/13/2018 1020   GFRAA >60 08/13/2018 1020       Component Value Date/Time   WBC 6.8 08/13/2018 1020   RBC 5.29 08/13/2018 1020   HGB 16.5 08/13/2018 1020   HCT 50.5 08/13/2018 1020   PLT 192 08/13/2018 1020   MCV 95.5 08/13/2018 1020   MCH 31.2 08/13/2018 1020   MCHC 32.7 08/13/2018 1020   RDW 12.0 08/13/2018 1020   LYMPHSABS 1.3 08/13/2018 1020   MONOABS 0.7 08/13/2018 1020   EOSABS 0.1 08/13/2018 1020   BASOSABS 0.1 08/13/2018 1020    No results found for: POCLITH, LITHIUM   No results found for: PHENYTOIN, PHENOBARB, VALPROATE, CBMZ   .res Assessment: Plan:    Bipolar 1 disorder, mixed, moderate ([place]) - Plan: lamoTRIgine (LAMICTAL) 100 MG tablet, risperiDONE (RISPERDAL) 1 MG  tablet  Generalized anxiety disorder - Plan: risperiDONE (RISPERDAL) 1 MG tablet  Attention deficit hyperactivity disorder (ADHD), predominantly inattentive type  Mild cognitive impairment  Seasonal depression (HCC)   Severe TRD and anxiety. Was good after surgery July to December and then relapsed.   Greater than 50% of face to face time with patient was spent on counseling and coordination of care.  Affectively better  after adding pramipexole to 0.5 mg twice daily and increasing Seroquel to 600 mg daily.  He was much more spontaneously talkative and attentive.  He also displayed humor which is unusual.  He was not irritable in the office .  It is noteworthy that he was seen alone today that may have affected his behavior.  He did not look overtly manic.  However at follow-up the patient felt that the pramipexole was not really helping and it was discontinued.  Chart was reviewed immediately after his last visit and the decision was made to retry Vraylar which had not been used in combination with selegiline which might improve its effectiveness.  He had problems with tremor  and stopped it.  He did not tolerate Vraylar even at 1.5 mg 3 days a week.  Discussed potential metabolic side effects associated with atypical antipsychotics, as well as potential risk for movement side effects. Advised pt to contact office if movement side effects occur.   Polypharmacy is a real potential problem but appears unavoidable at this time.  Particularly the combination of dopamine agonists and dopamine antagonists is not ideal but given that he does not respond to any of the traditional mood stabilizers that are not atypicals this is the result.  Consider amanatadine for the same reason.  Other options Saphris.  Option trial Ritalin.  Discussed the risk of mania and irritability and he prefers not to pursue that option at this time.  Inadequate response to selegiline.  We will discontinue it.  We will try off label use of Concerta after he has been off of selegiline for 5 days which is approximately 10 half-lives.  Encouraged him to get a second opinion.  We discussed 3 different practices where he could seek an opinion.  He may or may not choose to change practices but may do so in part because of difficulty riding the elevator.  It is of value to consider a second opinion because of his treatment resistant bipolar depression as well.  Greater than 50% of 40 min face to face time with patient was spent on counseling and coordination of care. This appt was 30 mins.  FU 3-4 mos  Lynder Parents, MD, DFAPA    No future appointments.  No orders of the defined types were placed in this encounter.     ------------------------------- [id number] [birth year] 64 y.o.  Video Visit via My Chart  I connected with pt by My Chart and verified that I am speaking with the correct person using two identifiers.   I discussed the limitations, risks, security and privacy concerns of performing an evaluation and management service by My Chart  and the availability of in person appointments. I  also discussed with the patient that there may be a patient responsible charge related to this service. The patient expressed understanding and agreed to proceed.  I discussed the assessment and treatment plan with the patient. The patient was provided an opportunity to ask questions and all were answered. The patient agreed with the plan and demonstrated an understanding of the instructions.   The patient was  advised to call back or seek an in-person evaluation if the symptoms worsen or if the condition fails to improve as anticipated.  I provided 30 minutes of video time during this encounter.  The patient was located at home and the provider was located office. Call started 1115 and ended at 1145  Subjective:   Patient ID:  Reginald Tucker is a 64 y.o. (DOB 02/26/1956) male.  Chief Complaint:  Chief Complaint  Patient presents with  . Follow-up  . Depression  . Anxiety    Anxiety Symptoms include nervous/anxious behavior. Patient reports no chest pain, confusion, decreased concentration or suicidal ideas.    Depression        Associated symptoms include fatigue.  Associated symptoms include no decreased concentration, no headaches and no suicidal ideas.  Past medical history includes anxiety.    Reginald Tucker presents to the office today for follow-up of TRD and anxiety.  Seen alone today.  Step son moving to an apt.  When seen April 06, 2019.  He had not seen any mood benefit from low-dose pramipexole and had side effects at higher dosages.  Therefore we weaned him off of the that medication.  visit August 07, 2019 and the following changes were made: Option retry Vraylar with selegiline which wasn't adequately done before.  Prior trial inadequate duration and SE Retry Vraylar at a lower dosage to prevent tremors: 1 capsule every Monday, Wednesday, Friday only. Made him jittery and stopped after a week.  Didn't see mood benefit.  October 2020 was last appointment and there  were no med changes as the patient was going to seek a second opinion.  The following was noted: Still irritable and real tired.  Easily fatigued with normal activity.  Appt with PCP Oct 27 to evaluate. Not much tremor.  Waves of depression worse in the morning.  Sleeping too much in recliner and lethargic.  Deep sleep.  Takes 60 min to fall asleep.  Cant' shut off his brain and hard to fall asleep.  Reads a lot and enjoys it especially history.  Pt reports that mood is Anxious, Depressed and Irritable rated 7/10 bad and describes anxiety as Moderate and occassional.  Less anxiety this visit than depression..  Staying in the house is making it worse too.   Anxiety symptoms include: Excessive Worry, Panic Symptoms, Social Anxiety,. Has claustrophobia.  Hard to ride elevators .  Can't go up the steps here.  Pt reports no sleep issues. Pt reports that appetite is good. Pt reports that energy is poor and anhedonia, loss of interest or pleasure in usual activities, poor motivation and withdrawn from usual activities. Concentration is down slightly. Suicidal thoughts:  denied by patient.  Still has days that does very little.  Will clean occ.   10/17/2020 appointment with the following noted:  Wife joined Patient has requested refills from this location but has not been seen here in a year which is not appropriate given his level of instability historically. Didn't go for second opinion since here.   Still doing about the same without much change.  Some days are better than others. Wife says he does some things and will go out sometimes.  Doesn't want to go out at dark.  Thinks winter is the worst time. Asked about [place]. Tolerating meds OK.   Chronically depressed and easily irritable, without pattern except brief. Wife says some weeks are worse than others. No walking or exercise.  Balance issues.  Was better when  did PT but he quit the exercises. Patient is highly claustrophobic and states is getting  worse.  He is having trouble using the elevator to get to our office.  He asked about finding another psychiatrist who has an office on the first floor and we discussed options.  Normal card review recently.  Sleep study negative for OSA noted on chart.  Never tried the Adderall bc they were afraid of the possible SE of mood swings.    Past Psychiatric Medication Trials: He has had multiple psych med failures as well .   Past psychiatric medications used include pramipexole NR, sertraline, Trintellix, 1.5 mg MWF jittery,  selegiline max 30mg  daily, Paxil,  lamotrigine, buspirone,  lithium with a tremor,  Depakote was side effects of feeling heavy and increased ammonia, Wellbutrin, Seroquel 800 mg a day, Latuda 120 mg a day, carbamazepine, Rexulti,  Perphenazine,  Vraylar 3mg  akathisia,He did not tolerate Vraylar even at 1.5 mg 3 days a week.   risperidone, olanzapine,  modafinil,  Adderall with selegiline with BP problems Light therapy failure. ECT twice, failed 2nd time. Pramipexole looked helpful but pt didn't think so and stopped.  No history of stimulant  Review of Systems:  Review of Systems  Constitutional: Positive for fatigue.  Cardiovascular: Negative for chest pain.  Musculoskeletal: Positive for back pain.  Neurological: Positive for weakness. Negative for tremors and headaches.  Psychiatric/Behavioral: Positive for depression and dysphoric mood. Negative for agitation, behavioral problems, confusion, decreased concentration, hallucinations, self-injury, sleep disturbance and suicidal ideas. The patient is nervous/anxious. The patient is not hyperactive.   Occurs 2-3 times/weekre: leg weakness.  Not orthostatic.  Medications: I have reviewed the patient's current medications.  Current Outpatient Medications  Medication Sig Dispense Refill  . aspirin EC 81 MG tablet Take 81 mg by mouth daily.    . Cholecalciferol (VITAMIN D3 PO) Take 15,000 Units by mouth daily.     Marland Kitchen  L-THEANINE PO Take by mouth.    . lamoTRIgine (LAMICTAL) 100 MG tablet Take 2 tablets (200 mg total) by mouth 2 (two) times daily. 360 tablet 1  . levothyroxine (SYNTHROID, LEVOTHROID) 150 MCG tablet Take 150 mcg by mouth daily before breakfast.    . lovastatin (MEVACOR) 40 MG tablet Take 40 mg by mouth daily.     Marland Kitchen MAGNESIUM CITRATE PO Take 1-2 tablets by mouth 2 (two) times daily. 400 mg, 2 tabs in the morning, 1 tab at bedtime    . Methylcobalamin 1 MG CHEW Chew 1 tablet by mouth daily.    Marland Kitchen ofloxacin (OCUFLOX) 0.3 % ophthalmic solution Instill 1 drop into the right eye every 3 hours-  0  . Omega-3 Fatty Acids (SUPER OMEGA 3 PO) Take 1 capsule by mouth 2 (two) times daily.     Marland Kitchen omeprazole (PRILOSEC) 20 MG capsule Take 20 mg by mouth daily.    Marland Kitchen OVER THE COUNTER MEDICATION Place 1 Squirt under the tongue at bedtime. Hemp oil  500 mg    . QUEtiapine (SEROQUEL) 400 MG tablet Take 1.5 tablets (600 mg total) by mouth at bedtime. 45 tablet 0  . risperiDONE (RISPERDAL) 1 MG tablet TAKE 1 TABLET(1 MG) BY MOUTH TWICE DAILY 180 tablet 1  . selegiline (ELDEPRYL) 5 MG tablet TAKE 4 TABLETS BY MOUTH TWICE DAILY WITH A MEAL. AM DOSE AND SECOND DOSE 4 HOURS LATER (Patient taking differently: Take 10 mg by mouth 2 (two) times daily with a meal. ) 720 tablet 0   No current facility-administered medications  for this visit.    Medication Side Effects:got nervous and irritable with 1.5 mg pramipexole in morning, ok with it split up and no change in mood.  Allergies:  Allergies  Allergen Reactions  . Sulfa Antibiotics Other (See Comments)    UNSPECIFIED REACTION OF CHILDHOOD  . Sulfamethoxazole Other (See Comments)    UNSPECIFIED REACTION OF CHILDHOOD  . Zolpidem Tartrate Anxiety      Nervous, uncontrollable    Past Medical History:  Diagnosis Date  . Acquired hallux rigidus of right foot 04/26/2017  . AKI (acute kidney injury) ([place]) 06/11/2017  . Anxiety   . Bipolar 1 disorder ([place])   . BMI  37.0-37.9, adult   . BPH (benign prostatic hyperplasia)   . Cataract   . Cataract    L eye  . CKD (chronic kidney disease), stage III ([place])   . Colon polyps   . COPD GOLD II with restrictive component  01/13/2016   Spirometry 01/13/2016  FEV1 1.84 (47%)  Ratio 62  - 01/13/2016  extensive coaching HFA effectiveness =    90% > try stiolto respimat 2 pffs each am > did not benefit so stopped when sample out - 01/13/2016  Walked RA x 3 laps @ 185 ft each stopped due to  End of study, nl pace, no desat  / min sob  - PFT's  03/16/2016  FEV1 2.28 (59 % ) ratio 67  p 12 % improvement from saba p no prior to study with DLCO  66 % corrects to 86 % for alv volume     . Depression   . Dysrhythmia   . Essential hypertension 01/19/2015  . Family history of coronary arteriosclerosis 01/19/2015   Father with MI   . Fatty liver   . GERD (gastroesophageal reflux disease) 08/11/2014  . History of colon polyps 06/18/2017  . Hyperlipidemia   . Hypertension   . Hypothyroidism 08/11/2014  . Hypothyroidism   . Insomnia 06/18/2017  . Insomnia   . Memory change   . Mixed hyperlipidemia 06/18/2017  . Morbid obesity ([place]) 04/12/6502   Complicated by HBP/ Low erv on pfts 03/16/2016 (31%)    . Morbid obesity due to excess calories ([place])   . Prediabetes   . Sleep apnea   . SOB (shortness of breath)   . Thyroid disease   . Tinnitus of both ears 06/18/2017  . Tobacco use 06/18/2017  . Unsteadiness on feet   . Vitamin D deficiency 06/18/2017  . Vitamin D deficiency     Family History  Problem Relation Age of Onset  . Cancer Mother   . Hyperlipidemia Father   . Hypertension Father   . CAD Father   . Stroke Father   . Aortic aneurysm Father   . Prostate cancer Father   . Hyperlipidemia Brother   . Appendicitis Maternal Grandfather     Social History   Socioeconomic History  . Marital status: Married    Spouse name: Not on file  . Number of children: Not on file  . Years of education: Not on file  . Highest education  level: Associate degree: occupational, Hotel manager, or vocational program  Occupational History  . 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 Smoking status: Former Smoker    Packs/day: 1.00    Years: 25.00    Pack years: 25.00    Types: Cigarettes    Quit date: 12/10/2000    Years since quitting: 19.8  . Smokeless tobacco: Never Used  .  Tobacco comment: heavy vape user- quit vaping 06/04/2017  Vaping Use  . Vaping Use: Former  Substance and Sexual Activity  . Alcohol use: No    Alcohol/week: 0.0 standard drinks  . Drug use: No  . Sexual activity: Not on file  Other Topics Concern  . Not on file  Social History Narrative   Admitted to [facility] 06/14/17- discharged   Lives at home with his wife   Married - [name]   Former smoker - stopped 2002   Alcohol none   Full code   Right handed   Drinks 2 cups of caffeine daily   Social Determinants of Health   Financial Resource Strain:   . Difficulty of Paying Living Expenses: Not on file  Food Insecurity:   . Worried About Charity fundraiser in the Last Year: Not on file  . Ran Out of Food in the Last Year: Not on file  Transportation Needs:   . Lack of Transportation (Medical): Not on file  . Lack of Transportation (Non-Medical): Not on file  Physical Activity:   . Days of Exercise per Week: Not on file  . Minutes of Exercise per Session: Not on file  Stress:   . Feeling of Stress : Not on file  Social Connections:   . Frequency of Communication with Friends and Family: Not on file  . Frequency of Social Gatherings with Friends and Family: Not on file  . Attends Religious Services: Not on file  . Active Member of Clubs or Organizations: Not on file  . Attends Archivist Meetings: Not on file  . Marital Status: Not on file  Intimate Partner Violence:   . Fear of Current or Ex-Partner: Not on file  . Emotionally Abused: Not on file  . Physically Abused: Not on file  . Sexually Abused: Not on file    Past  Medical History, Surgical history, Social history, and Family history were reviewed and updated as appropriate.   Please see review of systems for further details on the patient's review from today.   Objective:   Physical Exam:  There were no vitals taken for this visit.  Physical Exam Neurological:     Mental Status: He is alert and oriented to person, place, and time.     Cranial Nerves: No dysarthria.  Psychiatric:        Attention and Perception: Attention and perception normal.        Mood and Affect: Mood is anxious and depressed. Affect is blunt. Affect is not tearful.        Speech: Speech normal.        Behavior: Behavior is cooperative.        Thought Content: Thought content normal. Thought content is not paranoid or delusional. Thought content does not include homicidal or suicidal ideation. Thought content does not include homicidal or suicidal plan.        Cognition and Memory: Cognition and memory normal.        Judgment: Judgment normal.     Comments: Insight intact    Lab Review:     Component Value Date/Time   NA 140 08/13/2018 1020   K 4.3 08/13/2018 1020   CL 107 08/13/2018 1020   CO2 26 08/13/2018 1020   GLUCOSE 99 08/13/2018 1020   BUN 13 08/13/2018 1020   CREATININE 1.14 08/13/2018 1020   CALCIUM 9.4 08/13/2018 1020   PROT 6.9 02/11/2018 0623   ALBUMIN 4.2 02/11/2018 2025  AST 25 02/11/2018 0623   ALT 33 02/11/2018 0623   ALKPHOS 89 02/11/2018 0623   BILITOT 0.9 02/11/2018 0623   GFRNONAA >60 08/13/2018 1020   GFRAA >60 08/13/2018 1020       Component Value Date/Time   WBC 6.8 08/13/2018 1020   RBC 5.29 08/13/2018 1020   HGB 16.5 08/13/2018 1020   HCT 50.5 08/13/2018 1020   PLT 192 08/13/2018 1020   MCV 95.5 08/13/2018 1020   MCH 31.2 08/13/2018 1020   MCHC 32.7 08/13/2018 1020   RDW 12.0 08/13/2018 1020   LYMPHSABS 1.3 08/13/2018 1020   MONOABS 0.7 08/13/2018 1020   EOSABS 0.1 08/13/2018 1020   BASOSABS 0.1 08/13/2018 1020    No  results found for: POCLITH, LITHIUM   No results found for: PHENYTOIN, PHENOBARB, VALPROATE, CBMZ   .res Assessment: Plan:    Bipolar 1 disorder, mixed, moderate ([place]) - Plan: lamoTRIgine (LAMICTAL) 100 MG tablet, risperiDONE (RISPERDAL) 1 MG tablet  Generalized anxiety disorder - Plan: risperiDONE (RISPERDAL) 1 MG tablet  Attention deficit hyperactivity disorder (ADHD), predominantly inattentive type  Mild cognitive impairment  Seasonal depression (HCC)   Severe TRD and anxiety. Was good after surgery July to December and then relapsed.   Greater than 50% of face to face time with patient was spent on counseling and coordination of care.  Affectively better  after adding pramipexole to 0.5 mg twice daily and increasing Seroquel to 600 mg daily.  He was much more spontaneously talkative and attentive.  He also displayed humor which is unusual.  He was not irritable in the office .  It is noteworthy that he was seen alone today that may have affected his behavior.  He did not look overtly manic.  However at follow-up the patient felt that the pramipexole was not really helping and it was discontinued.  Chart was reviewed immediately after his last visit and the decision was made to retry Vraylar which had not been used in combination with selegiline which might improve its effectiveness.  He had problems with tremor and stopped it.   Discussed potential metabolic side effects associated with atypical antipsychotics, as well as potential risk for movement side effects. Advised pt to contact office if movement side effects occur.   Polypharmacy is a real potential problem but appears unavoidable at this time.  Particularly the combination of dopamine agonists and dopamine antagonists is not ideal but given that he does not respond to any of the traditional mood stabilizers that are not atypicals this is the result.  Consider amanatadine for the same reason.  Other options Saphris.  Option  trial Ritalin.  Discussed the risk of mania and irritability and he prefers not to pursue that option at this time.  DC selegiline   Wait 5 days to clear and start MPH ER 36 mg each AM.   If NR after 2 weeks, then ER increase if they call.  Encouraged him to get a second opinion.  We discussed 3 different practices where he could seek an opinion.  He may or may not choose to change practices but may do so in part because of difficulty riding the elevator.  It is of value to consider a second opinion because of his treatment resistant bipolar depression as well.  Option still seek a second opinion.  Greater than 50% of 40 min face to face time with patient was spent on counseling and coordination of care. This appt was 30 mins.  FU 6-8  weeks  Lynder Parents, MD, DFAPA    No future appointments.  No orders of the defined types were placed in this encounter.     -------------------------------

## 2020-10-24 ENCOUNTER — Other Ambulatory Visit: Payer: Self-pay | Admitting: Psychiatry

## 2020-10-24 ENCOUNTER — Telehealth: Payer: Self-pay | Admitting: Psychiatry

## 2020-10-24 DIAGNOSIS — F3162 Bipolar disorder, current episode mixed, moderate: Secondary | ICD-10-CM

## 2020-10-24 DIAGNOSIS — G3184 Mild cognitive impairment, so stated: Secondary | ICD-10-CM

## 2020-10-24 DIAGNOSIS — F900 Attention-deficit hyperactivity disorder, predominantly inattentive type: Secondary | ICD-10-CM

## 2020-10-24 MED ORDER — METHYLPHENIDATE HCL ER (OSM) 36 MG PO TBCR: 36.0000 mg | EXTENDED_RELEASE_TABLET | Freq: Every day | ORAL | 0 refills | Status: DC

## 2020-10-24 NOTE — Progress Notes (Signed)
Patient has been off selegiline for double the minimum half-life.  He can start Concerta 36 mg every morning tomorrow.

## 2020-10-24 NOTE — Telephone Encounter (Signed)
Pt called in to ask that  an rx for Methylphendate. Pt said that he was suppose to start on Sunday. Please send to [facility] in [place] on [address]

## 2020-10-24 NOTE — Telephone Encounter (Signed)
Please review

## 2020-10-24 NOTE — Telephone Encounter (Signed)
RX sent as planned for concerta 36 mg AM off label for TR bipolar depression

## 2020-10-25 NOTE — Telephone Encounter (Signed)
Prior authorization not received from pharmacy but  nurse initiated through cover my meds. PA sent to  [facility] ID# [id number]  Pending response

## 2020-10-25 NOTE — Telephone Encounter (Signed)
Hiroki wife called asking if we had received the PA need for his methylphenidate from the pharmacy.  She said he was supposed to start it the past Sunday and now it needs a PA so he will delayed further in starting the medication.  Please do the PA for this medication.

## 2020-10-25 NOTE — Telephone Encounter (Signed)
Prior approval received for METHYLPHENIDATE 36 MG TABLET effective 10/25/2020-10/25/2021 with [employer]

## 2020-10-25 NOTE — Telephone Encounter (Signed)
Patient notified

## 2020-11-21 ENCOUNTER — Other Ambulatory Visit: Payer: Self-pay | Admitting: Psychiatry

## 2020-11-21 ENCOUNTER — Telehealth: Payer: Self-pay | Admitting: Psychiatry

## 2020-11-21 DIAGNOSIS — F900 Attention-deficit hyperactivity disorder, predominantly inattentive type: Secondary | ICD-10-CM

## 2020-11-21 DIAGNOSIS — F3162 Bipolar disorder, current episode mixed, moderate: Secondary | ICD-10-CM

## 2020-11-21 DIAGNOSIS — G3184 Mild cognitive impairment, so stated: Secondary | ICD-10-CM

## 2020-11-21 MED ORDER — METHYLPHENIDATE HCL ER (OSM) 54 MG PO TBCR: 54.0000 mg | EXTENDED_RELEASE_TABLET | Freq: Every day | ORAL | 0 refills | Status: DC

## 2020-11-21 NOTE — Telephone Encounter (Signed)
Call to RS

## 2020-11-21 NOTE — Telephone Encounter (Signed)
Pt left message that he may need an increase in Methylphenidate. Pt said that the dose he is on now is not working. Please send to [facility] in [place].

## 2020-11-22 ENCOUNTER — Other Ambulatory Visit: Payer: Self-pay | Admitting: Psychiatry

## 2020-11-22 NOTE — Telephone Encounter (Signed)
Call to RS

## 2020-11-23 NOTE — Telephone Encounter (Signed)
Next visit is 02/21/21

## 2020-11-23 NOTE — Telephone Encounter (Signed)
Next visit 02/21/21

## 2020-12-15 DIAGNOSIS — R7303 Prediabetes: Secondary | ICD-10-CM | POA: Diagnosis not present

## 2020-12-15 DIAGNOSIS — E559 Vitamin D deficiency, unspecified: Secondary | ICD-10-CM | POA: Diagnosis not present

## 2020-12-15 DIAGNOSIS — Z23 Encounter for immunization: Secondary | ICD-10-CM | POA: Diagnosis not present

## 2020-12-15 DIAGNOSIS — E782 Mixed hyperlipidemia: Secondary | ICD-10-CM | POA: Diagnosis not present

## 2020-12-15 DIAGNOSIS — F319 Bipolar disorder, unspecified: Secondary | ICD-10-CM | POA: Diagnosis not present

## 2020-12-15 DIAGNOSIS — Z0000 Encounter for general adult medical examination without abnormal findings: Secondary | ICD-10-CM | POA: Diagnosis not present

## 2020-12-15 DIAGNOSIS — I10 Essential (primary) hypertension: Secondary | ICD-10-CM | POA: Diagnosis not present

## 2020-12-15 DIAGNOSIS — E039 Hypothyroidism, unspecified: Secondary | ICD-10-CM | POA: Diagnosis not present

## 2020-12-15 DIAGNOSIS — R29898 Other symptoms and signs involving the musculoskeletal system: Secondary | ICD-10-CM | POA: Diagnosis not present

## 2020-12-15 DIAGNOSIS — R946 Abnormal results of thyroid function studies: Secondary | ICD-10-CM | POA: Diagnosis not present

## 2020-12-22 DIAGNOSIS — J069 Acute upper respiratory infection, unspecified: Secondary | ICD-10-CM | POA: Diagnosis not present

## 2020-12-23 ENCOUNTER — Other Ambulatory Visit: Payer: Self-pay | Admitting: Psychiatry

## 2020-12-30 DIAGNOSIS — E039 Hypothyroidism, unspecified: Secondary | ICD-10-CM | POA: Diagnosis not present

## 2020-12-30 DIAGNOSIS — A4189 Other specified sepsis: Secondary | ICD-10-CM | POA: Diagnosis not present

## 2020-12-30 DIAGNOSIS — J9601 Acute respiratory failure with hypoxia: Secondary | ICD-10-CM | POA: Diagnosis not present

## 2020-12-30 DIAGNOSIS — R9431 Abnormal electrocardiogram [ECG] [EKG]: Secondary | ICD-10-CM | POA: Diagnosis not present

## 2020-12-30 DIAGNOSIS — R509 Fever, unspecified: Secondary | ICD-10-CM | POA: Diagnosis not present

## 2020-12-30 DIAGNOSIS — J449 Chronic obstructive pulmonary disease, unspecified: Secondary | ICD-10-CM | POA: Diagnosis not present

## 2020-12-30 DIAGNOSIS — U071 COVID-19: Secondary | ICD-10-CM | POA: Diagnosis not present

## 2020-12-30 DIAGNOSIS — R0602 Shortness of breath: Secondary | ICD-10-CM | POA: Diagnosis not present

## 2020-12-30 DIAGNOSIS — I10 Essential (primary) hypertension: Secondary | ICD-10-CM | POA: Diagnosis not present

## 2020-12-30 DIAGNOSIS — K219 Gastro-esophageal reflux disease without esophagitis: Secondary | ICD-10-CM | POA: Diagnosis not present

## 2020-12-30 DIAGNOSIS — R000 Tachycardia, unspecified: Secondary | ICD-10-CM | POA: Diagnosis not present

## 2020-12-30 DIAGNOSIS — F319 Bipolar disorder, unspecified: Secondary | ICD-10-CM | POA: Diagnosis not present

## 2020-12-30 DIAGNOSIS — I493 Ventricular premature depolarization: Secondary | ICD-10-CM | POA: Diagnosis not present

## 2020-12-30 DIAGNOSIS — Z7982 Long term (current) use of aspirin: Secondary | ICD-10-CM | POA: Diagnosis not present

## 2020-12-30 DIAGNOSIS — Z79899 Other long term (current) drug therapy: Secondary | ICD-10-CM | POA: Diagnosis not present

## 2020-12-30 DIAGNOSIS — F17290 Nicotine dependence, other tobacco product, uncomplicated: Secondary | ICD-10-CM | POA: Diagnosis not present

## 2020-12-30 DIAGNOSIS — Z9981 Dependence on supplemental oxygen: Secondary | ICD-10-CM | POA: Diagnosis not present

## 2020-12-30 DIAGNOSIS — J440 Chronic obstructive pulmonary disease with acute lower respiratory infection: Secondary | ICD-10-CM | POA: Diagnosis not present

## 2020-12-30 DIAGNOSIS — A419 Sepsis, unspecified organism: Secondary | ICD-10-CM | POA: Diagnosis not present

## 2020-12-30 DIAGNOSIS — J9811 Atelectasis: Secondary | ICD-10-CM | POA: Diagnosis not present

## 2020-12-30 DIAGNOSIS — R6520 Severe sepsis without septic shock: Secondary | ICD-10-CM | POA: Diagnosis not present

## 2020-12-30 DIAGNOSIS — R059 Cough, unspecified: Secondary | ICD-10-CM | POA: Diagnosis not present

## 2020-12-30 DIAGNOSIS — J1282 Pneumonia due to coronavirus disease 2019: Secondary | ICD-10-CM | POA: Diagnosis not present

## 2020-12-30 DIAGNOSIS — R0902 Hypoxemia: Secondary | ICD-10-CM | POA: Diagnosis not present

## 2020-12-30 DIAGNOSIS — E785 Hyperlipidemia, unspecified: Secondary | ICD-10-CM | POA: Diagnosis not present

## 2021-01-05 DIAGNOSIS — I493 Ventricular premature depolarization: Secondary | ICD-10-CM | POA: Diagnosis not present

## 2021-01-05 DIAGNOSIS — U071 COVID-19: Secondary | ICD-10-CM | POA: Diagnosis not present

## 2021-01-05 DIAGNOSIS — J9601 Acute respiratory failure with hypoxia: Secondary | ICD-10-CM | POA: Diagnosis not present

## 2021-01-05 DIAGNOSIS — Z9981 Dependence on supplemental oxygen: Secondary | ICD-10-CM | POA: Diagnosis not present

## 2021-01-05 DIAGNOSIS — R000 Tachycardia, unspecified: Secondary | ICD-10-CM | POA: Diagnosis not present

## 2021-01-06 DIAGNOSIS — Z882 Allergy status to sulfonamides status: Secondary | ICD-10-CM | POA: Diagnosis not present

## 2021-01-06 DIAGNOSIS — F319 Bipolar disorder, unspecified: Secondary | ICD-10-CM | POA: Diagnosis not present

## 2021-01-06 DIAGNOSIS — Z7982 Long term (current) use of aspirin: Secondary | ICD-10-CM | POA: Diagnosis not present

## 2021-01-06 DIAGNOSIS — E785 Hyperlipidemia, unspecified: Secondary | ICD-10-CM | POA: Diagnosis not present

## 2021-01-06 DIAGNOSIS — K219 Gastro-esophageal reflux disease without esophagitis: Secondary | ICD-10-CM | POA: Diagnosis not present

## 2021-01-06 DIAGNOSIS — U071 COVID-19: Secondary | ICD-10-CM | POA: Diagnosis not present

## 2021-01-06 DIAGNOSIS — J189 Pneumonia, unspecified organism: Secondary | ICD-10-CM | POA: Diagnosis not present

## 2021-01-06 DIAGNOSIS — J9811 Atelectasis: Secondary | ICD-10-CM | POA: Diagnosis not present

## 2021-01-06 DIAGNOSIS — J449 Chronic obstructive pulmonary disease, unspecified: Secondary | ICD-10-CM | POA: Diagnosis not present

## 2021-01-06 DIAGNOSIS — I10 Essential (primary) hypertension: Secondary | ICD-10-CM | POA: Diagnosis not present

## 2021-01-06 DIAGNOSIS — R5383 Other fatigue: Secondary | ICD-10-CM | POA: Diagnosis not present

## 2021-01-06 DIAGNOSIS — Z6832 Body mass index (BMI) 32.0-32.9, adult: Secondary | ICD-10-CM | POA: Diagnosis not present

## 2021-01-06 DIAGNOSIS — F17290 Nicotine dependence, other tobacco product, uncomplicated: Secondary | ICD-10-CM | POA: Diagnosis not present

## 2021-01-06 DIAGNOSIS — R000 Tachycardia, unspecified: Secondary | ICD-10-CM | POA: Diagnosis not present

## 2021-01-06 DIAGNOSIS — E039 Hypothyroidism, unspecified: Secondary | ICD-10-CM | POA: Diagnosis not present

## 2021-01-06 DIAGNOSIS — J90 Pleural effusion, not elsewhere classified: Secondary | ICD-10-CM | POA: Diagnosis not present

## 2021-01-06 DIAGNOSIS — R0902 Hypoxemia: Secondary | ICD-10-CM | POA: Diagnosis not present

## 2021-01-06 DIAGNOSIS — J440 Chronic obstructive pulmonary disease with acute lower respiratory infection: Secondary | ICD-10-CM | POA: Diagnosis not present

## 2021-01-06 DIAGNOSIS — I493 Ventricular premature depolarization: Secondary | ICD-10-CM | POA: Diagnosis not present

## 2021-01-06 DIAGNOSIS — Z888 Allergy status to other drugs, medicaments and biological substances status: Secondary | ICD-10-CM | POA: Diagnosis not present

## 2021-01-06 DIAGNOSIS — Z9981 Dependence on supplemental oxygen: Secondary | ICD-10-CM | POA: Diagnosis not present

## 2021-01-06 DIAGNOSIS — I712 Thoracic aortic aneurysm, without rupture: Secondary | ICD-10-CM | POA: Diagnosis not present

## 2021-01-06 DIAGNOSIS — J1282 Pneumonia due to coronavirus disease 2019: Secondary | ICD-10-CM | POA: Diagnosis not present

## 2021-01-06 DIAGNOSIS — R001 Bradycardia, unspecified: Secondary | ICD-10-CM | POA: Diagnosis not present

## 2021-01-06 DIAGNOSIS — E669 Obesity, unspecified: Secondary | ICD-10-CM | POA: Diagnosis not present

## 2021-01-06 DIAGNOSIS — J9601 Acute respiratory failure with hypoxia: Secondary | ICD-10-CM | POA: Diagnosis not present

## 2021-01-06 DIAGNOSIS — J690 Pneumonitis due to inhalation of food and vomit: Secondary | ICD-10-CM | POA: Diagnosis not present

## 2021-01-06 DIAGNOSIS — Z79899 Other long term (current) drug therapy: Secondary | ICD-10-CM | POA: Diagnosis not present

## 2021-01-06 DIAGNOSIS — K760 Fatty (change of) liver, not elsewhere classified: Secondary | ICD-10-CM | POA: Diagnosis not present

## 2021-01-06 DIAGNOSIS — T17590A Other foreign object in bronchus causing asphyxiation, initial encounter: Secondary | ICD-10-CM | POA: Diagnosis not present

## 2021-01-07 DIAGNOSIS — I10 Essential (primary) hypertension: Secondary | ICD-10-CM | POA: Diagnosis not present

## 2021-01-07 DIAGNOSIS — R000 Tachycardia, unspecified: Secondary | ICD-10-CM | POA: Diagnosis not present

## 2021-01-07 DIAGNOSIS — J9601 Acute respiratory failure with hypoxia: Secondary | ICD-10-CM | POA: Diagnosis not present

## 2021-01-07 DIAGNOSIS — U071 COVID-19: Secondary | ICD-10-CM | POA: Diagnosis not present

## 2021-01-07 DIAGNOSIS — E785 Hyperlipidemia, unspecified: Secondary | ICD-10-CM | POA: Diagnosis not present

## 2021-01-07 DIAGNOSIS — J1282 Pneumonia due to coronavirus disease 2019: Secondary | ICD-10-CM | POA: Diagnosis not present

## 2021-01-07 DIAGNOSIS — J9811 Atelectasis: Secondary | ICD-10-CM | POA: Diagnosis not present

## 2021-01-08 DIAGNOSIS — E785 Hyperlipidemia, unspecified: Secondary | ICD-10-CM | POA: Diagnosis not present

## 2021-01-08 DIAGNOSIS — I10 Essential (primary) hypertension: Secondary | ICD-10-CM | POA: Diagnosis not present

## 2021-01-08 DIAGNOSIS — R000 Tachycardia, unspecified: Secondary | ICD-10-CM | POA: Diagnosis not present

## 2021-01-08 DIAGNOSIS — J9601 Acute respiratory failure with hypoxia: Secondary | ICD-10-CM | POA: Diagnosis not present

## 2021-01-08 DIAGNOSIS — U071 COVID-19: Secondary | ICD-10-CM | POA: Diagnosis not present

## 2021-01-08 DIAGNOSIS — J1282 Pneumonia due to coronavirus disease 2019: Secondary | ICD-10-CM | POA: Diagnosis not present

## 2021-01-08 DIAGNOSIS — J9811 Atelectasis: Secondary | ICD-10-CM | POA: Diagnosis not present

## 2021-01-09 DIAGNOSIS — J9811 Atelectasis: Secondary | ICD-10-CM | POA: Diagnosis not present

## 2021-01-09 DIAGNOSIS — E785 Hyperlipidemia, unspecified: Secondary | ICD-10-CM | POA: Diagnosis not present

## 2021-01-09 DIAGNOSIS — E039 Hypothyroidism, unspecified: Secondary | ICD-10-CM | POA: Diagnosis not present

## 2021-01-09 DIAGNOSIS — I712 Thoracic aortic aneurysm, without rupture: Secondary | ICD-10-CM | POA: Diagnosis not present

## 2021-01-09 DIAGNOSIS — R000 Tachycardia, unspecified: Secondary | ICD-10-CM | POA: Diagnosis not present

## 2021-01-09 DIAGNOSIS — F319 Bipolar disorder, unspecified: Secondary | ICD-10-CM | POA: Diagnosis not present

## 2021-01-09 DIAGNOSIS — J1282 Pneumonia due to coronavirus disease 2019: Secondary | ICD-10-CM | POA: Diagnosis not present

## 2021-01-09 DIAGNOSIS — I10 Essential (primary) hypertension: Secondary | ICD-10-CM | POA: Diagnosis not present

## 2021-01-09 DIAGNOSIS — J9601 Acute respiratory failure with hypoxia: Secondary | ICD-10-CM | POA: Diagnosis not present

## 2021-01-09 DIAGNOSIS — U071 COVID-19: Secondary | ICD-10-CM | POA: Diagnosis not present

## 2021-01-09 DIAGNOSIS — K219 Gastro-esophageal reflux disease without esophagitis: Secondary | ICD-10-CM | POA: Diagnosis not present

## 2021-01-10 DIAGNOSIS — U071 COVID-19: Secondary | ICD-10-CM | POA: Diagnosis not present

## 2021-01-10 DIAGNOSIS — R000 Tachycardia, unspecified: Secondary | ICD-10-CM | POA: Diagnosis not present

## 2021-01-10 DIAGNOSIS — E039 Hypothyroidism, unspecified: Secondary | ICD-10-CM | POA: Diagnosis not present

## 2021-01-10 DIAGNOSIS — E785 Hyperlipidemia, unspecified: Secondary | ICD-10-CM | POA: Diagnosis not present

## 2021-01-10 DIAGNOSIS — I10 Essential (primary) hypertension: Secondary | ICD-10-CM | POA: Diagnosis not present

## 2021-01-10 DIAGNOSIS — I712 Thoracic aortic aneurysm, without rupture: Secondary | ICD-10-CM | POA: Diagnosis not present

## 2021-01-10 DIAGNOSIS — J9601 Acute respiratory failure with hypoxia: Secondary | ICD-10-CM | POA: Diagnosis not present

## 2021-01-10 DIAGNOSIS — J9811 Atelectasis: Secondary | ICD-10-CM | POA: Diagnosis not present

## 2021-01-10 DIAGNOSIS — F319 Bipolar disorder, unspecified: Secondary | ICD-10-CM | POA: Diagnosis not present

## 2021-01-10 DIAGNOSIS — K219 Gastro-esophageal reflux disease without esophagitis: Secondary | ICD-10-CM | POA: Diagnosis not present

## 2021-01-11 DIAGNOSIS — J1282 Pneumonia due to coronavirus disease 2019: Secondary | ICD-10-CM | POA: Diagnosis not present

## 2021-01-11 DIAGNOSIS — J9811 Atelectasis: Secondary | ICD-10-CM | POA: Diagnosis not present

## 2021-01-11 DIAGNOSIS — J9601 Acute respiratory failure with hypoxia: Secondary | ICD-10-CM | POA: Diagnosis not present

## 2021-01-11 DIAGNOSIS — U071 COVID-19: Secondary | ICD-10-CM | POA: Diagnosis not present

## 2021-01-12 DIAGNOSIS — I10 Essential (primary) hypertension: Secondary | ICD-10-CM | POA: Diagnosis not present

## 2021-01-12 DIAGNOSIS — U071 COVID-19: Secondary | ICD-10-CM | POA: Diagnosis not present

## 2021-01-12 DIAGNOSIS — J449 Chronic obstructive pulmonary disease, unspecified: Secondary | ICD-10-CM | POA: Diagnosis not present

## 2021-01-12 DIAGNOSIS — J9601 Acute respiratory failure with hypoxia: Secondary | ICD-10-CM | POA: Diagnosis not present

## 2021-01-12 DIAGNOSIS — J1282 Pneumonia due to coronavirus disease 2019: Secondary | ICD-10-CM | POA: Diagnosis not present

## 2021-01-12 DIAGNOSIS — Z9981 Dependence on supplemental oxygen: Secondary | ICD-10-CM | POA: Diagnosis not present

## 2021-01-13 DIAGNOSIS — U071 COVID-19: Secondary | ICD-10-CM | POA: Diagnosis not present

## 2021-01-13 DIAGNOSIS — J9601 Acute respiratory failure with hypoxia: Secondary | ICD-10-CM | POA: Diagnosis not present

## 2021-01-13 DIAGNOSIS — I10 Essential (primary) hypertension: Secondary | ICD-10-CM | POA: Diagnosis not present

## 2021-01-13 DIAGNOSIS — Z9981 Dependence on supplemental oxygen: Secondary | ICD-10-CM | POA: Diagnosis not present

## 2021-01-13 DIAGNOSIS — J449 Chronic obstructive pulmonary disease, unspecified: Secondary | ICD-10-CM | POA: Diagnosis not present

## 2021-01-13 DIAGNOSIS — J1282 Pneumonia due to coronavirus disease 2019: Secondary | ICD-10-CM | POA: Diagnosis not present

## 2021-01-14 DIAGNOSIS — U071 COVID-19: Secondary | ICD-10-CM | POA: Diagnosis not present

## 2021-01-14 DIAGNOSIS — I10 Essential (primary) hypertension: Secondary | ICD-10-CM | POA: Diagnosis not present

## 2021-01-14 DIAGNOSIS — J449 Chronic obstructive pulmonary disease, unspecified: Secondary | ICD-10-CM | POA: Diagnosis not present

## 2021-01-19 DIAGNOSIS — U071 COVID-19: Secondary | ICD-10-CM | POA: Diagnosis not present

## 2021-01-20 ENCOUNTER — Other Ambulatory Visit: Payer: Self-pay | Admitting: Psychiatry

## 2021-01-20 DIAGNOSIS — F411 Generalized anxiety disorder: Secondary | ICD-10-CM

## 2021-01-20 DIAGNOSIS — F3162 Bipolar disorder, current episode mixed, moderate: Secondary | ICD-10-CM

## 2021-01-25 DIAGNOSIS — Z961 Presence of intraocular lens: Secondary | ICD-10-CM | POA: Diagnosis not present

## 2021-01-25 DIAGNOSIS — H52223 Regular astigmatism, bilateral: Secondary | ICD-10-CM | POA: Diagnosis not present

## 2021-01-25 DIAGNOSIS — Z9849 Cataract extraction status, unspecified eye: Secondary | ICD-10-CM | POA: Diagnosis not present

## 2021-01-25 DIAGNOSIS — H5201 Hypermetropia, right eye: Secondary | ICD-10-CM | POA: Diagnosis not present

## 2021-01-27 DIAGNOSIS — H6123 Impacted cerumen, bilateral: Secondary | ICD-10-CM | POA: Diagnosis not present

## 2021-02-21 ENCOUNTER — Telehealth (INDEPENDENT_AMBULATORY_CARE_PROVIDER_SITE_OTHER): Payer: PPO | Admitting: Psychiatry

## 2021-02-21 ENCOUNTER — Other Ambulatory Visit: Payer: Self-pay

## 2021-02-21 ENCOUNTER — Encounter: Payer: Self-pay | Admitting: Psychiatry

## 2021-02-21 DIAGNOSIS — G3184 Mild cognitive impairment, so stated: Secondary | ICD-10-CM

## 2021-02-21 DIAGNOSIS — F411 Generalized anxiety disorder: Secondary | ICD-10-CM | POA: Diagnosis not present

## 2021-02-21 DIAGNOSIS — F338 Other recurrent depressive disorders: Secondary | ICD-10-CM

## 2021-02-21 DIAGNOSIS — F3162 Bipolar disorder, current episode mixed, moderate: Secondary | ICD-10-CM | POA: Diagnosis not present

## 2021-02-21 DIAGNOSIS — F900 Attention-deficit hyperactivity disorder, predominantly inattentive type: Secondary | ICD-10-CM | POA: Diagnosis not present

## 2021-02-21 NOTE — Patient Instructions (Signed)
Stop risperidone Wait 3 days then reduce Seroquel to 1 tablet daily for 5 days, Then reduce Seroquel to 1/2 tablet for 5 days, Then reduce Seroquel to 100 mg nightly and start Capylta 1 daily

## 2021-02-21 NOTE — Progress Notes (Signed)
------------------------------- [id number] 05/09/1956 65 y.o.  Video Visit via My Chart  I connected with pt by My Chart and verified that I am speaking with the correct person using two identifiers.   I discussed the limitations, risks, security and privacy concerns of performing an evaluation and management service by My Chart  and the availability of in person appointments. I also discussed with the patient that there may be a patient responsible charge related to this service. The patient expressed understanding and agreed to proceed.  I discussed the assessment and treatment plan with the patient. The patient was provided an opportunity to ask questions and all were answered. The patient agreed with the plan and demonstrated an understanding of the instructions.   The patient was advised to call back or seek an in-person evaluation if the symptoms worsen or if the condition fails to improve as anticipated.  I provided 30 minutes of video time during this encounter.  The patient was located at home and the provider was located office. Call started 1115 and ended at 1145  Subjective:   Patient ID:  Reginald Tucker is a 65 y.o. (DOB 04/20/1956) male.  Chief Complaint:  Chief Complaint  Patient presents with   Follow-up   Bipolar 1 disorder, mixed, moderate ([place])   Depression    Anxiety Symptoms include nervous/anxious behavior and shortness of breath. Patient reports no chest pain, confusion, decreased concentration, palpitations or suicidal ideas.    Depression        Associated symptoms include fatigue.  Associated symptoms include no decreased concentration, no headaches and no suicidal ideas.  Past medical history includes anxiety.    Reginald Tucker presents to the office today for follow-up of TRD and anxiety.  When seen April 06, 2019.  He had not seen any mood benefit from low-dose pramipexole and had side effects at higher dosages.  Therefore we weaned him off of  the that medication.  visit August 07, 2019 and the following changes were made: Option retry Vraylar with selegiline which wasn't adequately done before.  Prior trial inadequate duration and SE Retry Vraylar at a lower dosage to prevent tremors: 1 capsule every Monday, Wednesday, Friday only. Made him jittery and stopped after a week.  Didn't see mood benefit.  October 2020 was last appointment and there were no med changes as the patient was going to seek a second opinion.  The following was noted: Still irritable and real tired.  Easily fatigued with normal activity.  Appt with PCP Oct 27 to evaluate. Not much tremor.  Waves of depression worse in the morning.  Sleeping too much in recliner and lethargic.  Deep sleep.  Takes 60 min to fall asleep.  Cant' shut off his brain and hard to fall asleep.  Reads a lot and enjoys it especially history.  Pt reports that mood is Anxious, Depressed and Irritable rated 7/10 bad and describes anxiety as Moderate and occassional.  Less anxiety this visit than depression..  Staying in the house is making it worse too.   Anxiety symptoms include: Excessive Worry, Panic Symptoms, Social Anxiety,. Has claustrophobia.  Hard to ride elevators .  Can't go up the steps here.  Pt reports no sleep issues. Pt reports that appetite is good. Pt reports that energy is poor and anhedonia, loss of interest or pleasure in usual activities, poor motivation and withdrawn from usual activities. Concentration is down slightly. Suicidal thoughts:  denied by patient.  Still has days that does very  little.  Will clean occ.   10/17/2020 appointment with the following noted:  Wife joined Patient has requested refills from this location but has not been seen here in a year which is not appropriate given his level of instability historically. Didn't go for second opinion since here.   Still doing about the same without much change.  Some days are better than others. Wife says he does  some things and will go out sometimes.  Doesn't want to go out at dark.  Thinks winter is the worst time. Asked about [place]. Tolerating meds OK.   Chronically depressed and easily irritable, without pattern except brief. Wife says some weeks are worse than others. No walking or exercise.  Balance issues.  Was better when did PT but he quit the exercises. Patient is highly claustrophobic and states is getting worse.  He is having trouble using the elevator to get to our office.  He asked about finding another psychiatrist who has an office on the first floor and we discussed options.  Normal card review recently.  02/21/21 appt noted: W involved in call. Very sick with covid pneumonia and nearly died in January 10, 2023 and still recovering. Doing breathing exercises and some walking.  Weak in am. A lot of depression in [facility] thinking he would die.  Now dep 7/10.   No change in mood off selegiline per pt or [name] but she's not sure. Concerta didn't help and they agree.  He stopped it and went back on selegiline. Memory is bad. Taking melatonin 10 comes and goes with benefit.  Takes 1 and 1/2 hours to go to sleep. To bed 1110 and up at 9-930  Sleep study negative for OSA noted on chart.  Never tried the Adderall bc they were afraid of the possible SE of mood swings.    Past Psychiatric Medication Trials: He has had multiple psych med failures as well .   Past psychiatric medications used include pramipexole NR, sertraline, Trintellix, 1.5 mg MWF jittery,  selegiline max 30mg  daily, Paxil, Wellbutrin, lamotrigine, carbamazepine, buspirone,  lithium with a tremor,  Depakote was side effects of feeling heavy and increased ammonia,   Seroquel 800 mg a day, Latuda 120 mg a day,  Rexulti,  Perphenazine,  Vraylar 3mg  akathisia,He did not tolerate Vraylar even at 1.5 mg 3 days a week.   risperidone, olanzapine,  modafinil,  Adderall with selegiline with BP problems Light therapy failure. ECT twice,  failed 2nd time. Affectively better  after adding pramipexole to 0.5 mg twice daily and increasing Seroquel to 600 mg daily but lost response apparently soon thereafter.  Review of Systems:  Review of Systems  Constitutional: Positive for fatigue.  Respiratory: Positive for shortness of breath.   Cardiovascular: Negative for chest pain and palpitations.  Musculoskeletal: Positive for back pain.  Neurological: Positive for weakness. Negative for tremors and headaches.  Psychiatric/Behavioral: Positive for depression and dysphoric mood. Negative for agitation, behavioral problems, confusion, decreased concentration, hallucinations, self-injury, sleep disturbance and suicidal ideas. The patient is nervous/anxious. The patient is not hyperactive.   Occurs 2-3 times/weekre: leg weakness.  Not orthostatic.  Medications: I have reviewed the patient's current medications.  Current Outpatient Medications  Medication Sig Dispense Refill   aspirin EC 81 MG tablet Take 81 mg by mouth daily.     Cholecalciferol (VITAMIN D3 PO) Take 15,000 Units by mouth daily.      L-THEANINE PO Take by mouth.     lamoTRIgine (LAMICTAL) 100 MG tablet Take 2  tablets (200 mg total) by mouth 2 (two) times daily. 360 tablet 1   levothyroxine (SYNTHROID, LEVOTHROID) 150 MCG tablet Take 150 mcg by mouth daily before breakfast.     lovastatin (MEVACOR) 40 MG tablet Take 40 mg by mouth daily.      MAGNESIUM CITRATE PO Take 1-2 tablets by mouth 2 (two) times daily. 400 mg, 2 tabs in the morning, 1 tab at bedtime     Methylcobalamin 1 MG CHEW Chew 1 tablet by mouth daily.     Omega-3 Fatty Acids (SUPER OMEGA 3 PO) Take 1 capsule by mouth 2 (two) times daily.      omeprazole (PRILOSEC) 20 MG capsule Take 20 mg by mouth daily.     QUEtiapine (SEROQUEL) 400 MG tablet TAKE 1 AND 1/2 TABLETS(600 MG) BY MOUTH AT BEDTIME 45 tablet 1   risperiDONE (RISPERDAL) 1 MG tablet TAKE 1 TABLET(1 MG) BY MOUTH TWICE DAILY 180 tablet 0    methylphenidate (CONCERTA) 54 MG PO CR tablet Take 1 tablet (54 mg total) by mouth daily. (Patient not taking: Reported on 02/21/2021) 30 tablet 0   ofloxacin (OCUFLOX) 0.3 % ophthalmic solution Instill 1 drop into the right eye every 3 hours- (Patient not taking: Reported on 02/21/2021)  0   OVER THE COUNTER MEDICATION Place 1 Squirt under the tongue at bedtime. Hemp oil  500 mg (Patient not taking: Reported on 02/21/2021)     selegiline (ELDEPRYL) 5 MG tablet TAKE 4 TABLETS BY MOUTH TWICE DAILY WITH A MEAL. AM DOSE AND SECOND DOSE 4 HOURS LATER (Patient not taking: Reported on 02/21/2021) 720 tablet 0   No current facility-administered medications for this visit.    Medication Side Effects:got nervous and irritable with 1.5 mg pramipexole in morning, ok with it split up and no change in mood.  Allergies:  Allergies  Allergen Reactions   Sulfa Antibiotics Other (See Comments)    UNSPECIFIED REACTION OF CHILDHOOD   Sulfamethoxazole Other (See Comments)    UNSPECIFIED REACTION OF CHILDHOOD   Zolpidem Tartrate Anxiety      Nervous, uncontrollable    Past Medical History:  Diagnosis Date   Acquired hallux rigidus of right foot 04/26/2017   AKI (acute kidney injury) ([place]) 06/11/2017   Anxiety    Bipolar 1 disorder (HCC)    BMI 37.0-37.9, adult    BPH (benign prostatic hyperplasia)    Cataract    Cataract    L eye   CKD (chronic kidney disease), stage III (HCC)    Colon polyps    COPD GOLD II with restrictive component  01/13/2016   Spirometry 01/13/2016  FEV1 1.84 (47%)  Ratio 62  - 01/13/2016  extensive coaching HFA effectiveness =    90% > try stiolto respimat 2 pffs each am > did not benefit so stopped when sample out - 01/13/2016  Walked RA x 3 laps @ 185 ft each stopped due to  End of study, nl pace, no desat  / min sob  - PFT's  03/16/2016  FEV1 2.28 (59 % ) ratio 67  p 12 % improvement from saba p no prior to study with DLCO  66 % corrects to 86 % for alv volume       Depression    Dysrhythmia    Essential hypertension 01/19/2015   Family history of coronary arteriosclerosis 01/19/2015   Father with MI    Fatty liver    GERD (gastroesophageal reflux disease) 08/11/2014   History of colon polyps 06/18/2017  Hyperlipidemia    Hypertension    Hypothyroidism 08/11/2014   Hypothyroidism    Insomnia 06/18/2017   Insomnia    Memory change    Mixed hyperlipidemia 06/18/2017   Morbid obesity ([place]) 07/14/1659   Complicated by HBP/ Low erv on pfts 03/16/2016 (31%)     Morbid obesity due to excess calories (HCC)    Prediabetes    Sleep apnea    SOB (shortness of breath)    Thyroid disease    Tinnitus of both ears 06/18/2017   Tobacco use 06/18/2017   Unsteadiness on feet    Vitamin D deficiency 06/18/2017   Vitamin D deficiency     Family History  Problem Relation Age of Onset   Cancer Mother    Hyperlipidemia Father    Hypertension Father    CAD Father    Stroke Father    Aortic aneurysm Father    Prostate cancer Father    Hyperlipidemia Brother    Appendicitis Maternal Grandfather     Social History   Socioeconomic History   Marital status: Married    Spouse name: Not on file   Number of children: Not on file   Years of education: Not on file   Highest education level: Associate degree: occupational, Hotel manager, or vocational program  Occupational History   Occupation: act.  assist  Tobacco Use   Smoking status: Former Smoker    Packs/day: 1.00    Years: 25.00    Pack years: 25.00    Types: Cigarettes    Quit date: 12/10/2000    Years since quitting: 20.2   Smokeless tobacco: Never Used   Tobacco comment: heavy vape user- quit vaping 06/04/2017  Vaping Use   Vaping Use: Former  Substance and Sexual Activity   Alcohol use: No    Alcohol/week: 0.0 standard drinks   Drug use: No   Sexual activity: Not on file  Other Topics Concern   Not on file  Social History Narrative   Admitted to [facility] 06/14/17- discharged   Lives at home with his wife   Married - [name]   Former smoker - stopped 2002   Alcohol none   Full code   Right handed   Drinks 2 cups of caffeine daily   Social Determinants of Radio broadcast assistant Strain: Not on file  Food Insecurity: Not on file  Transportation Needs: Not on file  Physical Activity: Not on file  Stress: Not on file  Social Connections: Not on file  Intimate Partner Violence: Not on file    Past Medical History, Surgical history, Social history, and Family history were reviewed and updated as appropriate.   Please see review of systems for further details on the patient's review from today.   Objective:   Physical Exam:  There were no vitals taken for this visit.  Physical Exam Neurological:     Mental Status: He is alert and oriented to person, place, and time.     Cranial Nerves: No dysarthria.  Psychiatric:        Attention and Perception: Attention and perception normal.        Mood and Affect: Mood is anxious and depressed. Affect is blunt. Affect is not tearful.        Speech: Speech normal.        Behavior: Behavior is cooperative.        Thought Content: Thought content normal. Thought content is not paranoid or delusional. Thought content does not include  homicidal or suicidal ideation. Thought content does not include homicidal or suicidal plan.        Cognition and Memory: Cognition and memory normal.        Judgment: Judgment normal.     Comments: Insight intact Admits irritable with demands.  Depression and Anxiety unchanged    Lab Review:     Component Value Date/Time   NA 140 08/13/2018 1020   K 4.3 08/13/2018 1020   CL 107 08/13/2018 1020   CO2 26 08/13/2018 1020   GLUCOSE 99 08/13/2018 1020   BUN 13 08/13/2018 1020   CREATININE 1.14 08/13/2018 1020   CALCIUM 9.4 08/13/2018 1020   PROT 6.9 02/11/2018 0623   ALBUMIN 4.2 02/11/2018 0623   AST 25 02/11/2018 0623   ALT 33 02/11/2018 0623    ALKPHOS 89 02/11/2018 0623   BILITOT 0.9 02/11/2018 0623   GFRNONAA >60 08/13/2018 1020   GFRAA >60 08/13/2018 1020       Component Value Date/Time   WBC 6.8 08/13/2018 1020   RBC 5.29 08/13/2018 1020   HGB 16.5 08/13/2018 1020   HCT 50.5 08/13/2018 1020   PLT 192 08/13/2018 1020   MCV 95.5 08/13/2018 1020   MCH 31.2 08/13/2018 1020   MCHC 32.7 08/13/2018 1020   RDW 12.0 08/13/2018 1020   LYMPHSABS 1.3 08/13/2018 1020   MONOABS 0.7 08/13/2018 1020   EOSABS 0.1 08/13/2018 1020   BASOSABS 0.1 08/13/2018 1020    No results found for: POCLITH, LITHIUM   No results found for: PHENYTOIN, PHENOBARB, VALPROATE, CBMZ   .res Assessment: Plan:    Bipolar 1 disorder, mixed, moderate (HCC)  Generalized anxiety disorder  Seasonal depression (HCC)  Mild cognitive impairment  Attention deficit hyperactivity disorder (ADHD), predominantly inattentive type   Severe TRD and anxiety. Was good after surgery July to December and then relapsed.   Greater than 50% of face to face time with patient was spent on counseling and coordination of care.  Marland Kitchen  He was much more spontaneously talkative and attentive.  He also displayed humor which is unusual.   However at follow-up the patient felt that the pramipexole was not really helping and it was discontinued.  Chart was reviewed immediately after his last visit and the decision was made to retry Vraylar which had not been used in combination with selegiline which might improve its effectiveness.  He had problems with tremor and stopped it.   Discussed potential metabolic side effects associated with atypical antipsychotics, as well as potential risk for movement side effects. Advised pt to contact office if movement side effects occur.   Too much time in bed and disc sleep hygiene in detail.  Disc Covid's effects.  Polypharmacy is a real potential problem but appears unavoidable at this time.  Particularly the combination of dopamine agonists and  dopamine antagonists is not ideal but given that he does not respond to any of the traditional mood stabilizers that are not atypicals this is the result.  Consider amanatadine for the same reason.  Other options Saphris.  Option trial Ritalin.  Discussed the risk of mania and irritability and he prefers not to pursue that option at this time.  DC selegiline   Wait 5 days to clear and start MPH ER 36 mg each AM.   If NR after 2 weeks, then ER increase if they call.  Encouraged him to get a second opinion.  We discussed 3 different practices where he could seek an opinion.  He  may or may not choose to change practices but may do so in part because of difficulty riding the elevator.  It is of value to consider a second opinion because of his treatment resistant bipolar depression as well.  Option still seek a second opinion.  Greater than 50% of 40 min face to face time with patient was spent on counseling and coordination of care. This appt was 30 mins.  FU 6-8  weeks  Lynder Parents, MD, DFAPA    No future appointments.  No orders of the defined types were placed in this encounter.     -------------------------------

## 2021-03-13 ENCOUNTER — Telehealth: Payer: Self-pay | Admitting: Psychiatry

## 2021-03-13 ENCOUNTER — Other Ambulatory Visit: Payer: Self-pay | Admitting: Psychiatry

## 2021-03-13 MED ORDER — QUETIAPINE FUMARATE 100 MG PO TABS: 100.0000 mg | ORAL_TABLET | Freq: Every day | ORAL | 0 refills | Status: DC

## 2021-03-13 NOTE — Telephone Encounter (Signed)
Can this be sent?I'm unable to since it is not on the medication list

## 2021-03-13 NOTE — Telephone Encounter (Signed)
Pt wife called and said that they need a refill of the quetiapine 100 mg to be sent in since they can't cut the 400 mg in quarters. Please send to the [facility] on [address] in [place]

## 2021-03-13 NOTE — Telephone Encounter (Signed)
sent 

## 2021-03-29 ENCOUNTER — Other Ambulatory Visit: Payer: Self-pay | Admitting: Psychiatry

## 2021-03-29 ENCOUNTER — Telehealth: Payer: Self-pay | Admitting: Psychiatry

## 2021-03-29 DIAGNOSIS — F3162 Bipolar disorder, current episode mixed, moderate: Secondary | ICD-10-CM

## 2021-03-29 MED ORDER — CAPLYTA 42 MG PO CAPS: 1.0000 | ORAL_CAPSULE | Freq: Every day | ORAL | 2 refills | Status: DC

## 2021-03-29 NOTE — Telephone Encounter (Signed)
FYI, I sent RX Caplyta.  Failed multiple alternatives as you can see from progress notes

## 2021-03-29 NOTE — Telephone Encounter (Signed)
Wife called and said that the caplyta samples are working and I have pulled another box. However a new script needs to be went to the pharmacy to see if a PA is needed. His anxiety is less with this medicine

## 2021-03-29 NOTE — Telephone Encounter (Signed)
Reviewed and will follow up with PA

## 2021-03-29 NOTE — Telephone Encounter (Signed)
Please review

## 2021-03-30 NOTE — Telephone Encounter (Signed)
Caplyta available without prior authorization

## 2021-04-04 ENCOUNTER — Telehealth: Payer: Self-pay | Admitting: Psychiatry

## 2021-04-04 NOTE — Telephone Encounter (Signed)
I will get samples ready,how many mg?and I LVM letting them know to come get samples

## 2021-04-04 NOTE — Telephone Encounter (Signed)
Pt's wife [name] called to report Rx for Caplyta is $482.77 with insurance. Can not afford that. Pt has 10 pills remaining. Please advise. Contact # [phone]

## 2021-04-04 NOTE — Telephone Encounter (Signed)
His wife says he has 10 left and he has an appointment on May 16 so 1 more box of 10 should be sufficient to get him to the appointment

## 2021-04-04 NOTE — Telephone Encounter (Signed)
Give him another box of 10 to get him to the appt.  I would like to see what he's like on the med.  Then we'll decide what to do

## 2021-04-04 NOTE — Telephone Encounter (Signed)
Please review

## 2021-04-04 NOTE — Telephone Encounter (Signed)
I sent the pharmaceutical rep a message we are out of Caplyta samples

## 2021-04-11 NOTE — Telephone Encounter (Signed)
Reginald Tucker said he does have enough to last until his appt so they won't come get more

## 2021-04-11 NOTE — Telephone Encounter (Signed)
We have Caplyta samples now.  Please pull 1 box for this patient and call his wife and let her know she can pick it up.

## 2021-04-24 ENCOUNTER — Encounter: Payer: Self-pay | Admitting: Psychiatry

## 2021-04-24 ENCOUNTER — Telehealth (INDEPENDENT_AMBULATORY_CARE_PROVIDER_SITE_OTHER): Payer: PPO | Admitting: Psychiatry

## 2021-04-24 DIAGNOSIS — G3184 Mild cognitive impairment, so stated: Secondary | ICD-10-CM | POA: Diagnosis not present

## 2021-04-24 DIAGNOSIS — F3162 Bipolar disorder, current episode mixed, moderate: Secondary | ICD-10-CM

## 2021-04-24 DIAGNOSIS — F338 Other recurrent depressive disorders: Secondary | ICD-10-CM

## 2021-04-24 DIAGNOSIS — F411 Generalized anxiety disorder: Secondary | ICD-10-CM | POA: Diagnosis not present

## 2021-04-24 DIAGNOSIS — F900 Attention-deficit hyperactivity disorder, predominantly inattentive type: Secondary | ICD-10-CM | POA: Diagnosis not present

## 2021-04-24 DIAGNOSIS — F314 Bipolar disorder, current episode depressed, severe, without psychotic features: Secondary | ICD-10-CM | POA: Diagnosis not present

## 2021-04-24 MED ORDER — QUETIAPINE FUMARATE 400 MG PO TABS: 400.0000 mg | ORAL_TABLET | Freq: Every day | ORAL | 1 refills | Status: DC

## 2021-04-24 NOTE — Progress Notes (Addendum)
------------------------------- [id number] [birth year] 65 y.o.  Video Visit via My Chart  I connected with pt by My Chart and verified that I am speaking with the correct person using two identifiers.   I discussed the limitations, risks, security and privacy concerns of performing an evaluation and management service by My Chart  and the availability of in person appointments. I also discussed with the patient that there may be a patient responsible charge related to this service. The patient expressed understanding and agreed to proceed.  I discussed the assessment and treatment plan with the patient. The patient was provided an opportunity to ask questions and all were answered. The patient agreed with the plan and demonstrated an understanding of the instructions.   The patient was advised to call back or seek an in-person evaluation if the symptoms worsen or if the condition fails to improve as anticipated.  I provided 30 minutes of video time during this encounter.  The patient was located at home and the provider was located office. Session started at 930 and ended at 10  Subjective:   Patient ID:  Reginald Tucker is a 65 y.o. (DOB 1956/05/07) male.  Chief Complaint:  Chief Complaint  Patient presents with  . Follow-up  . Bipolar 1 disorder, mixed, moderate ([place])  . Depression  . Anxiety    Anxiety Symptoms include nervous/anxious behavior and shortness of breath. Patient reports no chest pain, confusion, decreased concentration or suicidal ideas.    Depression        Associated symptoms include fatigue.  Associated symptoms include no decreased concentration, no headaches and no suicidal ideas.  Past medical history includes anxiety.    [name] presents to the office today for follow-up of TRD and anxiety.  When seen April 06, 2019.  He had not seen any mood benefit from low-dose pramipexole and had side effects at higher dosages.  Therefore we weaned him off of  the that medication.  visit August 07, 2019 and the following changes were made: Option retry Vraylar with selegiline which wasn't adequately done before.  Prior trial inadequate duration and SE Retry Vraylar at a lower dosage to prevent tremors: 1 capsule every Monday, Wednesday, Friday only. Made him jittery and stopped after a week.  Didn't see mood benefit.  October 2020 was last appointment and there were no med changes as the patient was going to seek a second opinion.  The following was noted: Still irritable and real tired.  Easily fatigued with normal activity.  Appt with PCP Oct 27 to evaluate. Not much tremor. Waves of depression worse in the morning.  Sleeping too much in recliner and lethargic.  Deep sleep.  Takes 60 min to fall asleep.  Cant' shut off his brain and hard to fall asleep.  Reads a lot and enjoys it especially history.  Pt reports that mood is Anxious, Depressed and Irritable rated 7/10 bad and describes anxiety as Moderate and occassional.  Less anxiety this visit than depression..  Staying in the house is making it worse too.   Anxiety symptoms include: Excessive Worry, Panic Symptoms, Social Anxiety,. Has claustrophobia.  Hard to ride elevators .  Can't go up the steps here.  Pt reports no sleep issues. Pt reports that appetite is good. Pt reports that energy is poor and anhedonia, loss of interest or pleasure in usual activities, poor motivation and withdrawn from usual activities. Concentration is down slightly. Suicidal thoughts:  denied by patient.  Still has days that  does very little.  Will clean occ.   10/17/2020 appointment with the following noted:  Wife joined Patient has requested refills from this location but has not been seen here in a year which is not appropriate given his level of instability historically. Didn't go for second opinion since here.   Still doing about the same without much change.  Some days are better than others. Wife says he does some  things and will go out sometimes.  Doesn't want to go out at dark.  Thinks winter is the worst time. Asked about [place]. Tolerating meds OK.   Chronically depressed and easily irritable, without pattern except brief. Wife says some weeks are worse than others. No walking or exercise.  Balance issues.  Was better when did PT but he quit the exercises. Patient is highly claustrophobic and states is getting worse.  He is having trouble using the elevator to get to our office.  He asked about finding another psychiatrist who has an office on the first floor and we discussed options.  Normal card review recently.  02/21/21 appt noted: W involved in call. Very sick with covid pneumonia and nearly died in 01-12-23 and still recovering. Doing breathing exercises and some walking.  Weak in am. A lot of depression in [facility]ital thinking he would die.  Now dep 7/10.   No change in mood off selegiline per pt or [name] but she's not sure. Concerta didn't help and they agree.  He stopped it and went back on selegiline. Memory is bad. Taking melatonin 10 comes and goes with benefit.  Takes 1 and 1/2 hours to go to sleep. To bed 1110 and up at 9-930 Plan: DC selegiline   Wait 5 days to clear and start [facility] ER 36 mg each AM.   If NR after 2 weeks, then ER increase if they call.  03/29/2021 phone call from wife reporting that Caplyta was working and they wanted more samples while we worked on a [place]  04/24/2021 appointment with the following noted:  Wife on session also Took [facility] and couldn't tell a difference except wife said he was more anxious.  Stopped it. Caplyta 42 mg daily and now says he's worse.  Not doing well at all.  Wonders if he's worse bc of the reduction in Seroquel.  Going through hell for 3-4 weeks with 8/10 depression.  No SI. Getting up earlier. More anxiety and hard to lay down. Came to a head with wife saying she's leaving for 24 days to help her daughter with a child.  She was gone 10 days in [facility].   Inconsistency in life.  Better if going to church but usually doesn't do it. Got saved my life from Covid  Sleep study negative for OSA noted on chart.  Past Psychiatric Medication Trials: He has had multiple psych med failures as well .   Past psychiatric medications used include pramipexole NR, sertraline,  Trintellix 1.5 mg MWF jittery,  selegiline max 30mg  daily, Paxil, Wellbutrin, lamotrigine, carbamazepine, buspirone,  lithium with a tremor,  Depakote was side effects of feeling heavy and increased ammonia,   Seroquel 800 mg a day, Latuda 120 mg a day,  Rexulti,  Perphenazine,  Vraylar 3mg  akathisia, He did not tolerate Vraylar even at 1.5 mg 3 days a week.  risperidone, olanzapine, quetiapine Caplyta 42 NR modafinil,  Adderall with selegiline with BP problems, Concerta 54 NR Light therapy failure. ECT twice, failed 2nd time. Affectively better  after adding pramipexole to  0.5 mg twice daily and increasing Seroquel to 600 mg daily but lost response apparently soon thereafter.  Review of Systems:  Review of Systems  Constitutional: Positive for fatigue.  Respiratory: Positive for shortness of breath.   Cardiovascular: Negative for chest pain.  Musculoskeletal: Positive for back pain.  Neurological: Positive for weakness. Negative for tremors and headaches.  Psychiatric/Behavioral: Positive for depression and dysphoric mood. Negative for agitation, behavioral problems, confusion, decreased concentration, hallucinations, self-injury, sleep disturbance and suicidal ideas. The patient is nervous/anxious. The patient is not hyperactive.   Occurs 2-3 times/weekre: leg weakness.  Not orthostatic.  Medications: I have reviewed the patient's current medications.  Current Outpatient Medications  Medication Sig Dispense Refill  . aspirin EC 81 MG tablet Take 81 mg by mouth daily.    . Cholecalciferol (VITAMIN D3 PO) Take 15,000 Units by mouth daily.     Marland Kitchen lamoTRIgine (LAMICTAL) 100 MG  tablet Take 2 tablets (200 mg total) by mouth 2 (two) times daily. 360 tablet 1  . levothyroxine (SYNTHROID, LEVOTHROID) 150 MCG tablet Take 150 mcg by mouth daily before breakfast.    . lovastatin (MEVACOR) 40 MG tablet Take 40 mg by mouth daily.     Marland Kitchen MAGNESIUM MALATE PO Take by mouth.    . Methylcobalamin 1 MG CHEW Chew 1 tablet by mouth daily.    . Omega-3 Fatty Acids (SUPER OMEGA 3 PO) Take 1 capsule by mouth 2 (two) times daily.     Marland Kitchen omeprazole (PRILOSEC) 20 MG capsule Take 20 mg by mouth daily.    Marland Kitchen L-THEANINE PO Take by mouth. (Patient not taking: Reported on 04/24/2021)    . MAGNESIUM CITRATE PO Take 1-2 tablets by mouth 2 (two) times daily. 400 mg, 2 tabs in the morning, 1 tab at bedtime (Patient not taking: Reported on 04/24/2021)    . ofloxacin (OCUFLOX) 0.3 % ophthalmic solution Instill 1 drop into the right eye every 3 hours- (Patient not taking: Reported on 04/24/2021)  0  . OVER THE COUNTER MEDICATION Place 1 Squirt under the tongue at bedtime. Hemp oil  500 mg (Patient not taking: No sig reported)    . QUEtiapine (SEROQUEL) 400 MG tablet Take 1 tablet (400 mg total) by mouth at bedtime. 90 tablet 1   No current facility-administered medications for this visit.    Medication Side Effects:got nervous and irritable with 1.5 mg pramipexole in morning, ok with it split up and no change in mood.  Allergies:  Allergies  Allergen Reactions  . Sulfa Antibiotics Other (See Comments)    UNSPECIFIED REACTION OF CHILDHOOD  . Sulfamethoxazole Other (See Comments)    UNSPECIFIED REACTION OF CHILDHOOD  . Zolpidem Tartrate Anxiety      Nervous, uncontrollable    Past Medical History:  Diagnosis Date  . Acquired hallux rigidus of right foot 04/26/2017  . AKI (acute kidney injury) ([place]) 06/11/2017  . Anxiety   . Bipolar 1 disorder ([place])   . BMI 37.0-37.9, adult   . BPH (benign prostatic hyperplasia)   . Cataract   . Cataract    L eye  . CKD (chronic kidney disease), stage III ([place])    . Colon polyps   . COPD GOLD II with restrictive component  01/13/2016   Spirometry 01/13/2016  FEV1 1.84 (47%)  Ratio 62  - 01/13/2016  extensive coaching HFA effectiveness =    90% > try stiolto respimat 2 pffs each am > did not benefit so stopped when sample out - 01/13/2016  Walked RA x 3 laps @ 185 ft each stopped due to  End of study, nl pace, no desat  / min sob  - PFT's  03/16/2016  FEV1 2.28 (59 % ) ratio 67  p 12 % improvement from saba p no prior to study with DLCO  66 % corrects to 86 % for alv volume     . Depression   . Dysrhythmia   . Essential hypertension 01/19/2015  . Family history of coronary arteriosclerosis 01/19/2015   Father with MI   . Fatty liver   . GERD (gastroesophageal reflux disease) 08/11/2014  . History of colon polyps 06/18/2017  . Hyperlipidemia   . Hypertension   . Hypothyroidism 08/11/2014  . Hypothyroidism   . Insomnia 06/18/2017  . Insomnia   . Memory change   . Mixed hyperlipidemia 06/18/2017  . Morbid obesity ([place]) 06/11/5328   Complicated by HBP/ Low erv on pfts 03/16/2016 (31%)    . Morbid obesity due to excess calories ([place])   . Prediabetes   . Sleep apnea   . SOB (shortness of breath)   . Thyroid disease   . Tinnitus of both ears 06/18/2017  . Tobacco use 06/18/2017  . Unsteadiness on feet   . Vitamin D deficiency 06/18/2017  . Vitamin D deficiency     Family History  Problem Relation Age of Onset  . Cancer Mother   . Hyperlipidemia Father   . Hypertension Father   . CAD Father   . Stroke Father   . Aortic aneurysm Father   . Prostate cancer Father   . Hyperlipidemia Brother   . Appendicitis Maternal Grandfather     Social History   Socioeconomic History  . Marital status: Married    Spouse name: Not on file  . Number of children: Not on file  . Years of education: Not on file  . Highest education level: Associate degree: occupational, Hotel manager, or vocational program  Occupational History  . 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  Smoking status: Former Smoker    Packs/day: 1.00    Years: 25.00    Pack years: 25.00    Types: Cigarettes    Quit date: 12/10/2000    Years since quitting: 20.3  . Smokeless tobacco: Never Used  . Tobacco comment: heavy vape user- quit vaping 06/04/2017  Vaping Use  . Vaping Use: Former  Substance and Sexual Activity  . Alcohol use: No    Alcohol/week: 0.0 standard drinks  . Drug use: No  . Sexual activity: Not on file  Other Topics Concern  . Not on file  Social History Narrative   Admitted to [facility] 06/14/17- discharged   Lives at home with his wife   Married - [name]   Former smoker - stopped 2002   Alcohol none   Full code   Right handed   Drinks 2 cups of caffeine daily   Social Determinants of Radio broadcast assistant Strain: Not on file  Food Insecurity: Not on file  Transportation Needs: Not on file  Physical Activity: Not on file  Stress: Not on file  Social Connections: Not on file  Intimate Partner Violence: Not on file    Past Medical History, Surgical history, Social history, and Family history were reviewed and updated as appropriate.   Please see review of systems for further details on the patient's review from today.   Objective:   Physical Exam:  There were no vitals taken for this  visit.  Physical Exam Neurological:     Mental Status: He is alert and oriented to person, place, and time.     Cranial Nerves: No dysarthria.  Psychiatric:        Attention and Perception: Attention and perception normal.        Mood and Affect: Mood is anxious and depressed. Affect is blunt. Affect is not tearful.        Speech: Speech normal.        Behavior: Behavior is cooperative.        Thought Content: Thought content normal. Thought content is not paranoid or delusional. Thought content does not include homicidal or suicidal ideation. Thought content does not include homicidal or suicidal plan.        Cognition and Memory: Cognition and memory normal.         Judgment: Judgment normal.     Comments: Insight intact Admits irritable with demands.  Depression is worse.  and Anxiety unchanged    Lab Review:     Component Value Date/Time   NA 140 08/13/2018 1020   K 4.3 08/13/2018 1020   CL 107 08/13/2018 1020   CO2 26 08/13/2018 1020   GLUCOSE 99 08/13/2018 1020   BUN 13 08/13/2018 1020   CREATININE 1.14 08/13/2018 1020   CALCIUM 9.4 08/13/2018 1020   PROT 6.9 02/11/2018 0623   ALBUMIN 4.2 02/11/2018 0623   AST 25 02/11/2018 0623   ALT 33 02/11/2018 0623   ALKPHOS 89 02/11/2018 0623   BILITOT 0.9 02/11/2018 0623   GFRNONAA >60 08/13/2018 1020   GFRAA >60 08/13/2018 1020       Component Value Date/Time   WBC 6.8 08/13/2018 1020   RBC 5.29 08/13/2018 1020   HGB 16.5 08/13/2018 1020   HCT 50.5 08/13/2018 1020   PLT 192 08/13/2018 1020   MCV 95.5 08/13/2018 1020   MCH 31.2 08/13/2018 1020   MCHC 32.7 08/13/2018 1020   RDW 12.0 08/13/2018 1020   LYMPHSABS 1.3 08/13/2018 1020   MONOABS 0.7 08/13/2018 1020   EOSABS 0.1 08/13/2018 1020   BASOSABS 0.1 08/13/2018 1020    No results found for: POCLITH, LITHIUM   No results found for: PHENYTOIN, PHENOBARB, VALPROATE, CBMZ   .res Assessment: Plan:    Severe bipolar I disorder with depression ([place]) - Plan: QUEtiapine (SEROQUEL) 400 MG tablet  Generalized anxiety disorder  Seasonal depression ([facility])  Attention deficit hyperactivity disorder (ADHD), predominantly inattentive type  Mild cognitive impairment  Bipolar 1 disorder, mixed, moderate ([facility])    Severe TRD and anxiety. Caplyta failure.  Greater than 50% of face to face time with patient was spent on counseling and coordination of care.  .    Discussed potential metabolic side effects associated with atypical antipsychotics, as well as potential risk for movement side effects. Advised pt to contact office if movement side effects occur.   Polypharmacy is a real potential problem but appears unavoidable at this  time.  Particularly the combination of dopamine agonists and dopamine antagonists is not ideal but given that he does not respond to any of the traditional mood stabilizers that are not atypicals this is the result.  Consider amanatadine for the same reason.  Other options S[place]ris.   DC Caplyta DT failure. Return to Seroquel 400 bc worse without it. He wants to use risperidone 1 mg BID prn anxiety.   Discussed potential metabolic side effects associated with atypical antipsychotics, as well as potential risk for movement side effects. Advised  pt to contact office if movement side effects occur.   Encouraged him to get a second opinion.  We discussed 3 different practices where he could seek an opinion.  He may or may not choose to change practices but may do so in part because of difficulty riding the elevator.  It is of value to consider a second opinion because of his treatment resistant bipolar depression as well.  Option still seek a second opinion. Has seen Dr. Barbie Banner in past for 2nd opinion November 2016 suggesting options of do not simulator, retrying lithium, weaning carbamazepine because of drug to drug interactions and continued use of Seroquel..  Greater than 50% of 40 min face to face time with patient was spent on counseling and coordination of care. This appt was 30 mins.  FU 6-8  weeks  Lynder Parents, MD, DFAPA    No future appointments.  No orders of the defined types were placed in this encounter.     -------------------------------

## 2021-05-04 ENCOUNTER — Other Ambulatory Visit: Payer: Self-pay | Admitting: Psychiatry

## 2021-05-04 DIAGNOSIS — F3162 Bipolar disorder, current episode mixed, moderate: Secondary | ICD-10-CM

## 2021-05-30 ENCOUNTER — Telehealth: Payer: Self-pay | Admitting: Psychiatry

## 2021-06-02 ENCOUNTER — Other Ambulatory Visit: Payer: Self-pay

## 2021-06-02 DIAGNOSIS — F314 Bipolar disorder, current episode depressed, severe, without psychotic features: Secondary | ICD-10-CM

## 2021-06-02 MED ORDER — QUETIAPINE FUMARATE 100 MG PO TABS: 100.0000 mg | ORAL_TABLET | Freq: Every day | ORAL | 0 refills | Status: DC

## 2021-06-02 NOTE — Telephone Encounter (Signed)
Pt called asking about a refill for Quetiapine. Pt said that he only needs the 100mg  called in. Pt said that he will be out Sunday.

## 2021-06-02 NOTE — Telephone Encounter (Signed)
Rx sent 

## 2021-06-14 DIAGNOSIS — E039 Hypothyroidism, unspecified: Secondary | ICD-10-CM | POA: Diagnosis not present

## 2021-06-14 DIAGNOSIS — K219 Gastro-esophageal reflux disease without esophagitis: Secondary | ICD-10-CM | POA: Diagnosis not present

## 2021-06-14 DIAGNOSIS — R7309 Other abnormal glucose: Secondary | ICD-10-CM | POA: Diagnosis not present

## 2021-06-14 DIAGNOSIS — E782 Mixed hyperlipidemia: Secondary | ICD-10-CM | POA: Diagnosis not present

## 2021-06-15 DIAGNOSIS — L820 Inflamed seborrheic keratosis: Secondary | ICD-10-CM | POA: Diagnosis not present

## 2021-06-15 DIAGNOSIS — Q825 Congenital non-neoplastic nevus: Secondary | ICD-10-CM | POA: Diagnosis not present

## 2021-06-15 DIAGNOSIS — L218 Other seborrheic dermatitis: Secondary | ICD-10-CM | POA: Diagnosis not present

## 2021-06-15 DIAGNOSIS — D485 Neoplasm of uncertain behavior of skin: Secondary | ICD-10-CM | POA: Diagnosis not present

## 2021-06-15 DIAGNOSIS — L570 Actinic keratosis: Secondary | ICD-10-CM | POA: Diagnosis not present

## 2021-06-21 ENCOUNTER — Encounter: Payer: Self-pay | Admitting: Psychiatry

## 2021-06-21 ENCOUNTER — Telehealth (INDEPENDENT_AMBULATORY_CARE_PROVIDER_SITE_OTHER): Payer: PPO | Admitting: Psychiatry

## 2021-06-21 DIAGNOSIS — F900 Attention-deficit hyperactivity disorder, predominantly inattentive type: Secondary | ICD-10-CM

## 2021-06-21 DIAGNOSIS — F3162 Bipolar disorder, current episode mixed, moderate: Secondary | ICD-10-CM | POA: Diagnosis not present

## 2021-06-21 DIAGNOSIS — F338 Other recurrent depressive disorders: Secondary | ICD-10-CM

## 2021-06-21 DIAGNOSIS — F314 Bipolar disorder, current episode depressed, severe, without psychotic features: Secondary | ICD-10-CM | POA: Diagnosis not present

## 2021-06-21 DIAGNOSIS — F411 Generalized anxiety disorder: Secondary | ICD-10-CM

## 2021-06-21 DIAGNOSIS — G3184 Mild cognitive impairment, so stated: Secondary | ICD-10-CM

## 2021-06-21 MED ORDER — LAMOTRIGINE 100 MG PO TABS: ORAL_TABLET | ORAL | 0 refills | Status: DC

## 2021-06-21 MED ORDER — QUETIAPINE FUMARATE 300 MG PO TABS: 300.0000 mg | ORAL_TABLET | Freq: Every day | ORAL | 1 refills | Status: DC

## 2021-06-21 NOTE — Progress Notes (Signed)
------------------------------- [id number] [birth year] 65 y.o.  Video Visit via My Chart  I connected with pt by My Chart and verified that I am speaking with the correct person using two identifiers.   I discussed the limitations, risks, security and privacy concerns of performing an evaluation and management service by My Chart  and the availability of in person appointments. I also discussed with the patient that there may be a patient responsible charge related to this service. The patient expressed understanding and agreed to proceed.  I discussed the assessment and treatment plan with the patient. The patient was provided an opportunity to ask questions and all were answered. The patient agreed with the plan and demonstrated an understanding of the instructions.   The patient was advised to call back or seek an in-person evaluation if the symptoms worsen or if the condition fails to improve as anticipated.  I provided 30 minutes of video time during this encounter.  The patient was located at home and the provider was located office. Session from 1030 until 11:00 AM  Subjective:   Patient ID:  Reginald Tucker is a 65 y.o. (DOB 12-15-1955) male.  Chief Complaint:  Chief Complaint  Patient presents with   Follow-up   Bipolar 1 disorder, mixed, moderate ([place])   Depression   Anxiety    Anxiety Symptoms include nervous/anxious behavior and shortness of breath. Patient reports no chest pain, confusion, decreased concentration or suicidal ideas.    Depression        Associated symptoms include fatigue.  Associated symptoms include no decreased concentration, no headaches and no suicidal ideas.  Past medical history includes anxiety.   Reginald Tucker presents to the office today for follow-up of TRD and anxiety.  When seen April 06, 2019.  He had not seen any mood benefit from low-dose pramipexole and had side effects at higher dosages.  Therefore we weaned him off of the that  medication.  visit August 07, 2019 and the following changes were made: Option retry Vraylar with selegiline which wasn't adequately done before.  Prior trial inadequate duration and SE Retry Vraylar at a lower dosage to prevent tremors: 1 capsule every Monday, Wednesday, Friday only. Made him jittery and stopped after a week.  Didn't see mood benefit.  October 2020 was last appointment and there were no med changes as the patient was going to seek a second opinion.  The following was noted: Still irritable and real tired.  Easily fatigued with normal activity.  Appt with PCP Oct 27 to evaluate. Not much tremor. Waves of depression worse in the morning.  Sleeping too much in recliner and lethargic.  Deep sleep.  Takes 60 min to fall asleep.  Cant' shut off his brain and hard to fall asleep.  Reads a lot and enjoys it especially history.  Pt reports that mood is Anxious, Depressed and Irritable rated 7/10 bad and describes anxiety as Moderate and occassional.  Less anxiety this visit than depression..  Staying in the house is making it worse too.   Anxiety symptoms include: Excessive Worry, Panic Symptoms, Social Anxiety,. Has claustrophobia.  Hard to ride elevators .  Can't go up the steps here.  Pt reports no sleep issues. Pt reports that appetite is good. Pt reports that energy is poor and anhedonia, loss of interest or pleasure in usual activities, poor motivation and withdrawn from usual activities. Concentration is down slightly. Suicidal thoughts:  denied by patient.  Still has days that does very little.  Will clean occ.   10/17/2020 appointment with the following noted:  Wife joined Patient has requested refills from this location but has not been seen here in a year which is not appropriate given his level of instability historically. Didn't go for second opinion since here.   Still doing about the same without much change.  Some days are better than others. Wife says he does some things  and will go out sometimes.  Doesn't want to go out at dark.  Thinks winter is the worst time. Asked about [place]. Tolerating meds OK.   Chronically depressed and easily irritable, without pattern except brief. Wife says some weeks are worse than others. No walking or exercise.  Balance issues.  Was better when did PT but he quit the exercises. Patient is highly claustrophobic and states is getting worse.  He is having trouble using the elevator to get to our office.  He asked about finding another psychiatrist who has an office on the first floor and we discussed options.  Normal card review recently.  02/21/21 appt noted: W involved in call. Very sick with covid pneumonia and nearly died in 12/31/2022 and still recovering. Doing breathing exercises and some walking.  Weak in am. A lot of depression in [facility]ital thinking he would die.  Now dep 7/10.   No change in mood off selegiline per pt or Reginald Tucker but she's not sure. Concerta didn't help and they agree.  He stopped it and went back on selegiline. Memory is bad. Taking melatonin 10 comes and goes with benefit.  Takes 1 and 1/2 hours to go to sleep. To bed 1110 and up at 9-930 Plan: DC selegiline   Wait 5 days to clear and start [facility] ER 36 mg each AM.   If NR after 2 weeks, then ER increase if they call.  03/29/2021 phone call from wife reporting that Caplyta was working and they wanted more samples while we worked on a [place]  04/24/2021 appointment with the following noted:  Wife on session also Took [facility] and couldn't tell a difference except wife said he was more anxious.  Stopped it. Caplyta 42 mg daily and now says he's worse.  Not doing well at all.  Wonders if he's worse bc of the reduction in Seroquel.  Going through hell for 3-4 weeks with 8/10 depression.  No SI. Getting up earlier. More anxiety and hard to lay down. Came to a head with wife saying she's leaving for 24 days to help her daughter with a child.  She was gone 10 days in [facility].   Inconsistency in life.  Better if going to church but usually doesn't do it. Got saved my life from [place] DT failure. Return to Seroquel 400 bc worse without it. He wants to use risperidone 1 mg BID prn anxiety.  06/21/2021 appointment with the following noted:  Wife on call Only increased Seroquel to 300 mg HS. Doing great and about 3/10 depression.  Started improving pretty quickly.  Went back to church and out with friends.  W gone 3 weeks.  Going to Bible study.  Got together with friends he hasn't seen  in 4 years. I feel good. No SE. Sleep good usually.  Not sleeping excessively as much as in the past about 8-9 hours.  Anxiety is etter not gone.   Trying to walk without a cane.  Recent PCP was unremarkable.  Lost to 218#.    Sleep study negative for OSA noted  on chart.  Past Psychiatric Medication Trials: He has had multiple psych med failures as well .   Past psychiatric medications used include pramipexole NR, sertraline,  Trintellix 1.5 mg MWF jittery,  selegiline max 30mg  daily, Paxil, Wellbutrin, lamotrigine, carbamazepine, buspirone,  lithium with a tremor,  Depakote was side effects of feeling heavy and increased ammonia,   Seroquel 800 mg a day, Latuda 120 mg a day,  Rexulti,  Perphenazine,  Vraylar 3mg  akathisia, He did not tolerate Vraylar even at 1.5 mg 3 days a week.  risperidone, olanzapine, quetiapine Caplyta 42 NR modafinil,  Adderall with selegiline with BP problems, Concerta 54 NR Light therapy failure. ECT twice, failed 2nd time. Affectively better  after adding pramipexole to 0.5 mg twice daily and increasing Seroquel to 600 mg daily but lost response apparently soon thereafter.  Review of Systems:  Review of Systems  Constitutional:  Positive for fatigue.  Respiratory:  Positive for shortness of breath.   Cardiovascular:  Negative for chest pain.  Musculoskeletal:  Positive for back pain.  Neurological:  Positive for weakness. Negative  for tremors and headaches.  Psychiatric/Behavioral:  Positive for dysphoric mood. Negative for agitation, behavioral problems, confusion, decreased concentration, hallucinations, self-injury, sleep disturbance and suicidal ideas. The patient is nervous/anxious. The patient is not hyperactive.  Occurs 2-3 times/weekre: leg weakness.  Not orthostatic.  Medications: I have reviewed the patient's current medications.  Current Outpatient Medications  Medication Sig Dispense Refill   Ascorbic Acid (VITAMIN C) 1000 MG tablet Take 1,000 mg by mouth daily.     aspirin EC 81 MG tablet Take 81 mg by mouth daily.     Cholecalciferol (VITAMIN D3 PO) Take 15,000 Units by mouth daily.      L-THEANINE PO Take by mouth.     lamoTRIgine (LAMICTAL) 100 MG tablet TAKE 2 TABLETS(200 MG) BY MOUTH TWICE DAILY 360 tablet 0   levothyroxine (SYNTHROID, LEVOTHROID) 150 MCG tablet Take 150 mcg by mouth daily before breakfast.     lovastatin (MEVACOR) 40 MG tablet Take 40 mg by mouth daily.      MAGNESIUM MALATE PO Take by mouth.     Methylcobalamin 1 MG CHEW Chew 1 tablet by mouth daily.     Omega-3 Fatty Acids (SUPER OMEGA 3 PO) Take 1 capsule by mouth 2 (two) times daily.      omeprazole (PRILOSEC) 20 MG capsule Take 20 mg by mouth daily.     QUEtiapine (SEROQUEL) 100 MG tablet Take 1 tablet (100 mg total) by mouth at bedtime. (Patient taking differently: Take 300 mg by mouth at bedtime.) 30 tablet 0   zinc gluconate 50 MG tablet Take 50 mg by mouth daily.     MAGNESIUM CITRATE PO Take 1-2 tablets by mouth 2 (two) times daily. 400 mg, 2 tabs in the morning, 1 tab at bedtime (Patient not taking: Reported on 06/21/2021)     ofloxacin (OCUFLOX) 0.3 % ophthalmic solution Instill 1 drop into the right eye every 3 hours- (Patient not taking: Reported on 06/21/2021)  0   OVER THE COUNTER MEDICATION Place 1 Squirt under the tongue at bedtime. Hemp oil  500 mg (Patient not taking: Reported on 06/21/2021)     No current  facility-administered medications for this visit.    Medication Side Effects:got nervous and irritable with 1.5 mg pramipexole in morning, ok with it split up and no change in mood.  Allergies:  Allergies  Allergen Reactions   Sulfa Antibiotics Other (See Comments)  UNSPECIFIED REACTION OF CHILDHOOD   Sulfamethoxazole Other (See Comments)    UNSPECIFIED REACTION OF CHILDHOOD   Zolpidem Tartrate Anxiety      Nervous, uncontrollable    Past Medical History:  Diagnosis Date   Acquired hallux rigidus of right foot 04/26/2017   AKI (acute kidney injury) ([place]) 06/11/2017   Anxiety    Bipolar 1 disorder (HCC)    BMI 37.0-37.9, adult    BPH (benign prostatic hyperplasia)    Cataract    Cataract    L eye   CKD (chronic kidney disease), stage III (HCC)    Colon polyps    COPD GOLD II with restrictive component  01/13/2016   Spirometry 01/13/2016  FEV1 1.84 (47%)  Ratio 62  - 01/13/2016  extensive coaching HFA effectiveness =    90% > try stiolto respimat 2 pffs each am > did not benefit so stopped when sample out - 01/13/2016  Walked RA x 3 laps @ 185 ft each stopped due to  End of study, nl pace, no desat  / min sob  - PFT's  03/16/2016  FEV1 2.28 (59 % ) ratio 67  p 12 % improvement from saba p no prior to study with DLCO  66 % corrects to 86 % for alv volume      Depression    Dysrhythmia    Essential hypertension 01/19/2015   Family history of coronary arteriosclerosis 01/19/2015   Father with MI    Fatty liver    GERD (gastroesophageal reflux disease) 08/11/2014   History of colon polyps 06/18/2017   Hyperlipidemia    Hypertension    Hypothyroidism 08/11/2014   Hypothyroidism    Insomnia 06/18/2017   Insomnia    Memory change    Mixed hyperlipidemia 06/18/2017   Morbid obesity ([place]) 12/16/107   Complicated by HBP/ Low erv on pfts 03/16/2016 (31%)     Morbid obesity due to excess calories (HCC)    Prediabetes    Sleep apnea    SOB (shortness of breath)    Thyroid disease    Tinnitus of  both ears 06/18/2017   Tobacco use 06/18/2017   Unsteadiness on feet    Vitamin D deficiency 06/18/2017   Vitamin D deficiency     Family History  Problem Relation Age of Onset   Cancer Mother    Hyperlipidemia Father    Hypertension Father    CAD Father    Stroke Father    Aortic aneurysm Father    Prostate cancer Father    Hyperlipidemia Brother    Appendicitis Maternal Grandfather     Social History   Socioeconomic History   Marital status: Married    Spouse name: Not on file   Number of children: Not on file   Years of education: Not on file   Highest education level: Associate degree: occupational, Hotel manager, or vocational program  Occupational History   Occupation: act.  assist  Tobacco Use   Smoking status: Former    Packs/day: 1.00    Years: 25.00    Pack years: 25.00    Types: Cigarettes    Quit date: 12/10/2000    Years since quitting: 20.5   Smokeless tobacco: Never   Tobacco comments:    heavy vape user- quit vaping 06/04/2017  Vaping Use   Vaping Use: Former  Substance and Sexual Activity   Alcohol use: No    Alcohol/week: 0.0 standard drinks   Drug use: No   Sexual activity: Not on file  Other Topics Concern   Not on file  Social History Narrative   [facility]- discharged   Lives at home with his wife   Married - Reginald Tucker   Former smoker - stopped 2002   Alcohol none   Full code   Right handed   Drinks 2 cups of caffeine daily   Social Determinants of Radio broadcast assistant Strain: Not on file  Food Insecurity: Not on file  Transportation Needs: Not on file  Physical Activity: Not on file  Stress: Not on file  Social Connections: Not on file  Intimate Partner Violence: Not on file    Past Medical History, Surgical history, Social history, and Family history were reviewed and updated as appropriate.   Please see review of systems for further details on the patient's review from today.   Objective:   Physical  Exam:  There were no vitals taken for this visit.  Physical Exam Neurological:     Mental Status: He is alert and oriented to person, place, and time.     Cranial Nerves: No dysarthria.  Psychiatric:        Attention and Perception: Attention and perception normal.        Mood and Affect: Mood is anxious and depressed. Affect is blunt. Affect is not tearful.        Speech: Speech normal.        Behavior: Behavior is cooperative.        Thought Content: Thought content normal. Thought content is not paranoid or delusional. Thought content does not include homicidal or suicidal ideation. Thought content does not include homicidal or suicidal plan.        Cognition and Memory: Cognition and memory normal.        Judgment: Judgment normal.     Comments: Insight intact Admits irritable with demands.  Depression is worse.  and Anxiety unchanged   Lab Review:     Component Value Date/Time   NA 140 08/13/2018 1020   K 4.3 08/13/2018 1020   CL 107 08/13/2018 1020   CO2 26 08/13/2018 1020   GLUCOSE 99 08/13/2018 1020   BUN 13 08/13/2018 1020   CREATININE 1.14 08/13/2018 1020   CALCIUM 9.4 08/13/2018 1020   PROT 6.9 02/11/2018 0623   ALBUMIN 4.2 02/11/2018 0623   AST 25 02/11/2018 0623   ALT 33 02/11/2018 0623   ALKPHOS 89 02/11/2018 0623   BILITOT 0.9 02/11/2018 0623   GFRNONAA >60 08/13/2018 1020   GFRAA >60 08/13/2018 1020       Component Value Date/Time   WBC 6.8 08/13/2018 1020   RBC 5.29 08/13/2018 1020   HGB 16.5 08/13/2018 1020   HCT 50.5 08/13/2018 1020   PLT 192 08/13/2018 1020   MCV 95.5 08/13/2018 1020   MCH 31.2 08/13/2018 1020   MCHC 32.7 08/13/2018 1020   RDW 12.0 08/13/2018 1020   LYMPHSABS 1.3 08/13/2018 1020   MONOABS 0.7 08/13/2018 1020   EOSABS 0.1 08/13/2018 1020   BASOSABS 0.1 08/13/2018 1020    No results found for: POCLITH, LITHIUM   No results found for: PHENYTOIN, PHENOBARB, VALPROATE, CBMZ   .res Assessment: Plan:    Severe bipolar I  disorder with depression ([place])  Generalized anxiety disorder  Seasonal depression (HCC)  Attention deficit hyperactivity disorder (ADHD), predominantly inattentive type  Mild cognitive impairment    Severe TRD and anxiety. Caplyta failure.  Greater than 50% of 30 min non face to face time  with patient was spent on counseling and coordination of care. Disc history of TRD.  He is surprisingly better at this point on quetiapine 300 mg nightly and lamotrigine 200 mg twice daily.  He is not needing anxiety medicines or sleep medicines.  He is tolerating the medications well. Also we discussed at length that the increased and his physical and social activity and spiritual activity is clearly having a marked positive effect on his mood as well.  It is unlikely for medication alone to maintain him with regard to his mood.  He has a history of multiple recurrences and very unstable bipolar disorder.  He agrees to try to keep continue his positive social and physical behaviors.  Polypharmacy is a real potential problem but appears unavoidable at this time.  Particularly the combination of dopamine agonists and dopamine antagonists is not ideal but given that he does not respond to any of the traditional mood stabilizers that are not atypicals this is the result.  Continue Seroquel 300 mg HS    Discussed potential metabolic side effects associated with atypical antipsychotics, as well as potential risk for movement side effects. Advised pt to contact office if movement side effects occur.   Has seen Dr. Barbie Banner in past for 2nd opinion November 2016 suggesting options of do not simulator, retrying lithium, weaning carbamazepine because of drug to drug interactions and continued use of Seroquel..  FU 3 mos  Lynder Parents, MD, DFAPA    Future Appointments  Date Time Provider [place]  07/19/2021 11:00 AM Cottle, Billey Co., MD CP-CP None    No orders of the defined types were placed in  this encounter.     -------------------------------

## 2021-06-28 ENCOUNTER — Other Ambulatory Visit: Payer: Self-pay | Admitting: Psychiatry

## 2021-06-28 DIAGNOSIS — F314 Bipolar disorder, current episode depressed, severe, without psychotic features: Secondary | ICD-10-CM

## 2021-07-19 ENCOUNTER — Telehealth: Payer: PPO | Admitting: Psychiatry

## 2021-07-31 DIAGNOSIS — H61002 Unspecified perichondritis of left external ear: Secondary | ICD-10-CM | POA: Diagnosis not present

## 2021-07-31 DIAGNOSIS — Z85828 Personal history of other malignant neoplasm of skin: Secondary | ICD-10-CM | POA: Diagnosis not present

## 2021-07-31 DIAGNOSIS — C44222 Squamous cell carcinoma of skin of right ear and external auricular canal: Secondary | ICD-10-CM | POA: Diagnosis not present

## 2021-07-31 DIAGNOSIS — C44229 Squamous cell carcinoma of skin of left ear and external auricular canal: Secondary | ICD-10-CM | POA: Diagnosis not present

## 2021-08-21 DIAGNOSIS — Z85828 Personal history of other malignant neoplasm of skin: Secondary | ICD-10-CM | POA: Diagnosis not present

## 2021-08-21 DIAGNOSIS — C44229 Squamous cell carcinoma of skin of left ear and external auricular canal: Secondary | ICD-10-CM | POA: Diagnosis not present

## 2021-08-31 DIAGNOSIS — L570 Actinic keratosis: Secondary | ICD-10-CM | POA: Diagnosis not present

## 2021-08-31 DIAGNOSIS — Z4802 Encounter for removal of sutures: Secondary | ICD-10-CM | POA: Diagnosis not present

## 2021-09-01 DIAGNOSIS — H6123 Impacted cerumen, bilateral: Secondary | ICD-10-CM | POA: Diagnosis not present

## 2021-09-26 ENCOUNTER — Telehealth (INDEPENDENT_AMBULATORY_CARE_PROVIDER_SITE_OTHER): Payer: PPO | Admitting: Psychiatry

## 2021-09-26 ENCOUNTER — Encounter: Payer: Self-pay | Admitting: Psychiatry

## 2021-09-26 DIAGNOSIS — F411 Generalized anxiety disorder: Secondary | ICD-10-CM

## 2021-09-26 DIAGNOSIS — F338 Other recurrent depressive disorders: Secondary | ICD-10-CM

## 2021-09-26 DIAGNOSIS — F3162 Bipolar disorder, current episode mixed, moderate: Secondary | ICD-10-CM | POA: Diagnosis not present

## 2021-09-26 MED ORDER — QUETIAPINE FUMARATE 100 MG PO TABS: 100.0000 mg | ORAL_TABLET | Freq: Every day | ORAL | 1 refills | Status: DC

## 2021-09-26 MED ORDER — RISPERIDONE 1 MG PO TABS: 1.0000 mg | ORAL_TABLET | Freq: Two times a day (BID) | ORAL | 0 refills | Status: DC

## 2021-09-26 MED ORDER — LAMOTRIGINE 100 MG PO TABS: ORAL_TABLET | ORAL | 0 refills | Status: DC

## 2021-09-26 NOTE — Progress Notes (Signed)
------------------------------- [id number] 1956/02/01 65 y.o.  Video Visit via My Chart  I connected with pt by My Chart and verified that I am speaking with the correct person using two identifiers.   I discussed the limitations, risks, security and privacy concerns of performing an evaluation and management service by My Chart  and the availability of in person appointments. I also discussed with the patient that there may be a patient responsible charge related to this service. The patient expressed understanding and agreed to proceed.  I discussed the assessment and treatment plan with the patient. The patient was provided an opportunity to ask questions and all were answered. The patient agreed with the plan and demonstrated an understanding of the instructions.   The patient was advised to call back or seek an in-person evaluation if the symptoms worsen or if the condition fails to improve as anticipated.  I provided 30 minutes of video time during this encounter.  The patient was located at home and the provider was located office. Session from 1 until 130  Subjective:   Patient ID:  Reginald Tucker is a 65 y.o. (DOB 08-07-1956) male.  Chief Complaint:  Chief Complaint  Patient presents with   Follow-up   Severe bipolar I disorder with depression    Anxiety    Anxiety Symptoms include nervous/anxious behavior and shortness of breath. Patient reports no chest pain, confusion, decreased concentration or suicidal ideas.    Depression        Associated symptoms include no decreased concentration, no fatigue, no headaches and no suicidal ideas.  Past medical history includes anxiety.   Reginald Tucker presents to the office today for follow-up of TRD and anxiety.  When seen April 06, 2019.  He had not seen any mood benefit from low-dose pramipexole and had side effects at higher dosages.  Therefore we weaned him off of the that medication.  visit August 07, 2019 and the  following changes were made: Option retry Vraylar with selegiline which wasn't adequately done before.  Prior trial inadequate duration and SE Retry Vraylar at a lower dosage to prevent tremors: 1 capsule every Monday, Wednesday, Friday only. Made him jittery and stopped after a week.  Didn't see mood benefit.  October 2020 was last appointment and there were no med changes as the patient was going to seek a second opinion.  The following was noted: Still irritable and real tired.  Easily fatigued with normal activity.  Appt with PCP Oct 27 to evaluate. Not much tremor. Waves of depression worse in the morning.  Sleeping too much in recliner and lethargic.  Deep sleep.  Takes 60 min to fall asleep.  Cant' shut off his brain and hard to fall asleep.  Reads a lot and enjoys it especially history.  Pt reports that mood is Anxious, Depressed and Irritable rated 7/10 bad and describes anxiety as Moderate and occassional.  Less anxiety this visit than depression..  Staying in the house is making it worse too.   Anxiety symptoms include: Excessive Worry, Panic Symptoms, Social Anxiety,. Has claustrophobia.  Hard to ride elevators .  Can't go up the steps here.  Pt reports no sleep issues. Pt reports that appetite is good. Pt reports that energy is poor and anhedonia, loss of interest or pleasure in usual activities, poor motivation and withdrawn from usual activities. Concentration is down slightly. Suicidal thoughts:  denied by patient.  Still has days that does very little.  Will clean occ.  10/17/2020 appointment with the following noted:  Wife joined Patient has requested refills from this location but has not been seen here in a year which is not appropriate given his level of instability historically. Didn't go for second opinion since here.   Still doing about the same without much change.  Some days are better than others. Wife says he does some things and will go out sometimes.  Doesn't want to go  out at dark.  Thinks winter is the worst time. Asked about [place]. Tolerating meds OK.   Chronically depressed and easily irritable, without pattern except brief. Wife says some weeks are worse than others. No walking or exercise.  Balance issues.  Was better when did PT but he quit the exercises. Patient is highly claustrophobic and states is getting worse.  He is having trouble using the elevator to get to our office.  He asked about finding another psychiatrist who has an office on the first floor and we discussed options.  Normal card review recently.  02/21/21 appt noted: W involved in call. Very sick with covid pneumonia and nearly died in 01-25-23 and still recovering. Doing breathing exercises and some walking.  Weak in am. A lot of depression in [facility]opital thinking he would die.  Now dep 7/10.   No change in mood off selegiline per pt or Reginald Tucker but she's not sure. Concerta didn't help and they agree.  He stopped it and went back on selegiline. Memory is bad. Taking melatonin 10 comes and goes with benefit.  Takes 1 and 1/2 hours to go to sleep. To bed 1110 and up at 9-930 Plan: DC selegiline   Wait 5 days to clear and start MPH ER 36 mg each AM.   If NR after 2 weeks, then ER increase if they call.  03/29/2021 phone call from wife reporting that [facility] was working and they wanted more samples while we worked on a [place]  04/24/2021 appointment with the following noted:  Wife on session also Took MPH and couldn't tell a difference except wife said he was more anxious.  Stopped it. Caplyta 42 mg daily and now says he's worse.  Not doing well at all.  Wonders if he's worse bc of the reduction in Seroquel.  Going through hell for 3-4 weeks with 8/10 depression.  No SI. Getting up earlier. More anxiety and hard to lay down. Came to a head with wife saying she's leaving for 24 days to help her daughter with a child.  She was gone 10 days in [facility].  Inconsistency in life.  Better if going to church but  usually doesn't do it. Got saved my life from [place] DT failure. Return to Seroquel 400 bc worse without it. He wants to use risperidone 1 mg BID prn anxiety.  06/21/2021 appointment with the following noted:  Wife on call Only increased Seroquel to 300 mg HS. Doing great and about 3/10 depression.  Started improving pretty quickly.  Went back to church and out with friends.  W gone 3 weeks.  Going to Bible study.  Got together with friends he hasn't seen  in 4 years. I feel good. No SE. Sleep good usually.  Not sleeping excessively as much as in the past about 8-9 hours.  Anxiety is etter not gone.   Trying to walk without a cane.  Recent PCP was unremarkable.  Lost to 218#.    09/26/21 appt noted: wife also on call Great.  Gym 5-6  days per week. Lost 25#.  Getting out more.  Involved in church.  Feels better than last time and more active and socializing.  Mostly out of depression 1-2/10. No SE.  Better with queiapine 300 vs 400 re: sedation. Wife agrees he's dramatically better all around. Needs and gets 8-9 hours nightly and 2 brief naps daily. Anxiety is manageable and risperidone helps.  Risperidone also helps keep down the level of irritability which is occasional and manageable.  Sleep study negative for OSA noted on chart.  Past Psychiatric Medication Trials: He has had multiple psych med failures as well .   Past psychiatric medications used include pramipexole NR, sertraline,  Trintellix 1.5 mg MWF jittery,  selegiline max 30mg  daily, Paxil, Wellbutrin, lamotrigine, carbamazepine, buspirone,  lithium with a tremor,  Depakote was side effects of feeling heavy and increased ammonia,   Seroquel 800 mg a day, Latuda 120 mg a day,  Rexulti,  Perphenazine,  Vraylar 3mg  akathisia, He did not tolerate Vraylar even at 1.5 mg 3 days a week.  risperidone, olanzapine, quetiapine Caplyta 42 NR modafinil,  Adderall with selegiline with BP problems, Concerta 54  NR Light therapy failure. ECT twice, failed 2nd time. Affectively better  after adding pramipexole to 0.5 mg twice daily and increasing Seroquel to 600 mg daily but lost response apparently soon thereafter.  Review of Systems:  Review of Systems  Constitutional:  Negative for fatigue.  Respiratory:  Positive for shortness of breath.   Cardiovascular:  Negative for chest pain.  Musculoskeletal:  Positive for back pain.  Neurological:  Negative for tremors, weakness and headaches.  Psychiatric/Behavioral:  Negative for agitation, behavioral problems, confusion, decreased concentration, dysphoric mood, hallucinations, self-injury, sleep disturbance and suicidal ideas. The patient is nervous/anxious. The patient is not hyperactive.  Occurs 2-3 times/weekre: leg weakness.  Not orthostatic.  Medications: I have reviewed the patient's current medications.  Current Outpatient Medications  Medication Sig Dispense Refill   Ascorbic Acid (VITAMIN C) 1000 MG tablet Take 1,000 mg by mouth daily.     aspirin EC 81 MG tablet Take 81 mg by mouth daily.     Cholecalciferol (VITAMIN D3 PO) Take 15,000 Units by mouth daily.      L-THEANINE PO Take by mouth.     levothyroxine (SYNTHROID, LEVOTHROID) 150 MCG tablet Take 150 mcg by mouth daily before breakfast.     lovastatin (MEVACOR) 40 MG tablet Take 40 mg by mouth daily.      MAGNESIUM CITRATE PO Take 1-2 tablets by mouth 2 (two) times daily. 400 mg, 2 tabs in the morning, 1 tab at bedtime     Methylcobalamin 1 MG CHEW Chew 1 tablet by mouth daily.     ofloxacin (OCUFLOX) 0.3 % ophthalmic solution Instill 1 drop into the right eye every 3 hours-  0   Omega-3 Fatty Acids (SUPER OMEGA 3 PO) Take 1 capsule by mouth 2 (two) times daily.      omeprazole (PRILOSEC) 20 MG capsule Take 20 mg by mouth daily.     QUEtiapine (SEROQUEL) 100 MG tablet Take 1 tablet (100 mg total) by mouth at bedtime. 90 tablet 1   QUEtiapine (SEROQUEL) 300 MG tablet Take 1 tablet  (300 mg total) by mouth at bedtime. 90 tablet 1   risperiDONE (RISPERDAL) 1 MG tablet Take 1 tablet (1 mg total) by mouth 2 (two) times daily. 180 tablet 0   zinc gluconate 50 MG tablet Take 50 mg by mouth daily.     lamoTRIgine (  LAMICTAL) 100 MG tablet TAKE 2 TABLETS(200 MG) BY MOUTH TWICE DAILY 360 tablet 0   MAGNESIUM MALATE PO Take by mouth. (Patient not taking: Reported on 09/26/2021)     [place] 1 Squirt under the tongue at bedtime. Hemp oil  500 mg (Patient not taking: Reported on 09/26/2021)     No current facility-administered medications for this visit.    Medication Side Effects:got nervous and irritable with 1.5 mg pramipexole in morning, ok with it split up and no change in mood.  Allergies:  Allergies  Allergen Reactions   Sulfa Antibiotics Other (See Comments)    UNSPECIFIED REACTION OF CHILDHOOD   Sulfamethoxazole Other (See Comments)    UNSPECIFIED REACTION OF CHILDHOOD   Zolpidem Tartrate Anxiety      Nervous, uncontrollable    Past Medical History:  Diagnosis Date   Acquired hallux rigidus of right foot 04/26/2017   AKI (acute kidney injury) ([place]) 06/11/2017   Anxiety    Bipolar 1 disorder (HCC)    BMI 37.0-37.9, adult    BPH (benign prostatic hyperplasia)    Cataract    Cataract    L eye   CKD (chronic kidney disease), stage III (HCC)    Colon polyps    COPD GOLD II with restrictive component  01/13/2016   Spirometry 01/13/2016  FEV1 1.84 (47%)  Ratio 62  - 01/13/2016  extensive coaching HFA effectiveness =    90% > try stiolto respimat 2 pffs each am > did not benefit so stopped when sample out - 01/13/2016  Walked RA x 3 laps @ 185 ft each stopped due to  End of study, nl pace, no desat  / min sob  - PFT's  03/16/2016  FEV1 2.28 (59 % ) ratio 67  p 12 % improvement from saba p no prior to study with DLCO  66 % corrects to 86 % for alv volume      Depression    Dysrhythmia    Essential hypertension 01/19/2015   Family history of coronary  arteriosclerosis 01/19/2015   Father with MI    Fatty liver    GERD (gastroesophageal reflux disease) 08/11/2014   History of colon polyps 06/18/2017   Hyperlipidemia    Hypertension    Hypothyroidism 08/11/2014   Hypothyroidism    Insomnia 06/18/2017   Insomnia    Memory change    Mixed hyperlipidemia 06/18/2017   Morbid obesity ([place]) 07/10/8562   Complicated by HBP/ Low erv on pfts 03/16/2016 (31%)     Morbid obesity due to excess calories (HCC)    Prediabetes    Sleep apnea    SOB (shortness of breath)    Thyroid disease    Tinnitus of both ears 06/18/2017   Tobacco use 06/18/2017   Unsteadiness on feet    Vitamin D deficiency 06/18/2017   Vitamin D deficiency     Family History  Problem Relation Age of Onset   Cancer Mother    Hyperlipidemia Father    Hypertension Father    CAD Father    Stroke Father    Aortic aneurysm Father    Prostate cancer Father    Hyperlipidemia Brother    Appendicitis Maternal Grandfather     Social History   Socioeconomic History   Marital status: Married    Spouse name: Not on file   Number of children: Not on file   Years of education: Not on file   Highest education level: Associate degree: occupational, Hotel manager,  or vocational program  Occupational History   Occupation: act.  assist  Tobacco Use   Smoking status: Former    Packs/day: 1.00    Years: 25.00    Pack years: 25.00    Types: Cigarettes    Quit date: 12/10/2000    Years since quitting: 20.8   Smokeless tobacco: Never   Tobacco comments:    heavy vape user- quit vaping 06/04/2017  Vaping Use   Vaping Use: Former  Substance and Sexual Activity   Alcohol use: No    Alcohol/week: 0.0 standard drinks   Drug use: No   Sexual activity: Not on file  Other Topics Concern   Not on file  Social History Narrative   Admitted to [facility] 06/14/17- discharged   Lives at home with his wife   Married - Reginald Tucker   Former smoker - stopped 2002   Alcohol none   Full code   Right handed    Drinks 2 cups of caffeine daily   Social Determinants of Radio broadcast assistant Strain: Not on file  Food Insecurity: Not on file  Transportation Needs: Not on file  Physical Activity: Not on file  Stress: Not on file  Social Connections: Not on file  Intimate Partner Violence: Not on file    Past Medical History, Surgical history, Social history, and Family history were reviewed and updated as appropriate.   Please see review of systems for further details on the patient's review from today.   Objective:   Physical Exam:  There were no vitals taken for this visit.  Physical Exam Neurological:     Mental Status: He is alert and oriented to person, place, and time.     Cranial Nerves: No dysarthria.  Psychiatric:        Attention and Perception: Attention and perception normal.        Mood and Affect: Mood is anxious. Mood is not depressed. Affect is not blunt or tearful.        Speech: Speech normal.        Behavior: Behavior is cooperative.        Thought Content: Thought content normal. Thought content is not paranoid or delusional. Thought content does not include homicidal or suicidal ideation. Thought content does not include homicidal or suicidal plan.        Cognition and Memory: Cognition and memory normal.        Judgment: Judgment normal.     Comments: Insight intact Markedly bettter.  Mild occ irritability   Lab Review:     Component Value Date/Time   NA 140 08/13/2018 1020   K 4.3 08/13/2018 1020   CL 107 08/13/2018 1020   CO2 26 08/13/2018 1020   GLUCOSE 99 08/13/2018 1020   BUN 13 08/13/2018 1020   CREATININE 1.14 08/13/2018 1020   CALCIUM 9.4 08/13/2018 1020   PROT 6.9 02/11/2018 0623   ALBUMIN 4.2 02/11/2018 0623   AST 25 02/11/2018 0623   ALT 33 02/11/2018 0623   ALKPHOS 89 02/11/2018 0623   BILITOT 0.9 02/11/2018 0623   GFRNONAA >60 08/13/2018 1020   GFRAA >60 08/13/2018 1020       Component Value Date/Time   WBC 6.8 08/13/2018  1020   RBC 5.29 08/13/2018 1020   HGB 16.5 08/13/2018 1020   HCT 50.5 08/13/2018 1020   PLT 192 08/13/2018 1020   MCV 95.5 08/13/2018 1020   MCH 31.2 08/13/2018 1020   MCHC 32.7 08/13/2018 1020  RDW 12.0 08/13/2018 1020   LYMPHSABS 1.3 08/13/2018 1020   MONOABS 0.7 08/13/2018 1020   EOSABS 0.1 08/13/2018 1020   BASOSABS 0.1 08/13/2018 1020    No results found for: POCLITH, LITHIUM   No results found for: PHENYTOIN, PHENOBARB, VALPROATE, CBMZ   .res Assessment: Plan:    Bipolar 1 disorder, mixed, moderate ([place]) - Plan: QUEtiapine (SEROQUEL) 100 MG tablet, risperiDONE (RISPERDAL) 1 MG tablet, lamoTRIgine (LAMICTAL) 100 MG tablet  Generalized anxiety disorder  Seasonal depression (HCC)    Severe TRD and anxiety.   Greater than 50% of 30 min non face to face time with patient was spent on counseling and coordination of care. Disc history of TRD.  He is markedly better at this point on quetiapine 300 mg nightly and lamotrigine 200 mg twice daily and risperidone 1 mg twice daily.  He is not needing anxiety medicines or sleep medicines.  He is tolerating the medications well. Also we discussed at length that the increased and his physical and social activity and spiritual activity is clearly having a marked positive effect on his mood as well.  It is unlikely for medication alone to maintain him with regard to his mood.  He has a history of multiple recurrences and very unstable bipolar disorder.  He agrees to try to keep continue his positive social and physical behaviors.  He is also added gym 5 days a week which is clearly helping.   Continue Seroquel 300 mg HS Continue risperidone 1 mg twice daily as he is feels it is helping Continue lamotrigine 200 mg twice daily   Discussed potential metabolic side effects associated with atypical antipsychotics, as well as potential risk for movement side effects. Advised pt to contact office if movement side effects occur.   FU 4   mos  Lynder Parents, MD, DFAPA    No future appointments.   No orders of the defined types were placed in this encounter.     -------------------------------

## 2021-11-08 DIAGNOSIS — Z86010 Personal history of colonic polyps: Secondary | ICD-10-CM | POA: Diagnosis not present

## 2021-11-08 DIAGNOSIS — K5900 Constipation, unspecified: Secondary | ICD-10-CM | POA: Diagnosis not present

## 2021-12-18 DIAGNOSIS — E039 Hypothyroidism, unspecified: Secondary | ICD-10-CM | POA: Diagnosis not present

## 2021-12-18 DIAGNOSIS — E559 Vitamin D deficiency, unspecified: Secondary | ICD-10-CM | POA: Diagnosis not present

## 2021-12-18 DIAGNOSIS — E782 Mixed hyperlipidemia: Secondary | ICD-10-CM | POA: Diagnosis not present

## 2021-12-18 DIAGNOSIS — Z125 Encounter for screening for malignant neoplasm of prostate: Secondary | ICD-10-CM | POA: Diagnosis not present

## 2021-12-18 DIAGNOSIS — I10 Essential (primary) hypertension: Secondary | ICD-10-CM | POA: Diagnosis not present

## 2021-12-18 DIAGNOSIS — R7303 Prediabetes: Secondary | ICD-10-CM | POA: Diagnosis not present

## 2021-12-24 ENCOUNTER — Other Ambulatory Visit: Payer: Self-pay | Admitting: Psychiatry

## 2021-12-24 DIAGNOSIS — F3162 Bipolar disorder, current episode mixed, moderate: Secondary | ICD-10-CM

## 2021-12-27 ENCOUNTER — Other Ambulatory Visit: Payer: Self-pay | Admitting: Psychiatry

## 2021-12-27 DIAGNOSIS — F3162 Bipolar disorder, current episode mixed, moderate: Secondary | ICD-10-CM

## 2022-01-04 DIAGNOSIS — D121 Benign neoplasm of appendix: Secondary | ICD-10-CM | POA: Diagnosis not present

## 2022-01-04 DIAGNOSIS — K5730 Diverticulosis of large intestine without perforation or abscess without bleeding: Secondary | ICD-10-CM | POA: Diagnosis not present

## 2022-01-04 DIAGNOSIS — K648 Other hemorrhoids: Secondary | ICD-10-CM | POA: Diagnosis not present

## 2022-01-04 DIAGNOSIS — Z86010 Personal history of colonic polyps: Secondary | ICD-10-CM | POA: Diagnosis not present

## 2022-01-04 DIAGNOSIS — D123 Benign neoplasm of transverse colon: Secondary | ICD-10-CM | POA: Diagnosis not present

## 2022-01-09 DIAGNOSIS — D121 Benign neoplasm of appendix: Secondary | ICD-10-CM | POA: Diagnosis not present

## 2022-01-09 DIAGNOSIS — D123 Benign neoplasm of transverse colon: Secondary | ICD-10-CM | POA: Diagnosis not present

## 2022-01-20 ENCOUNTER — Other Ambulatory Visit: Payer: Self-pay | Admitting: Psychiatry

## 2022-01-20 DIAGNOSIS — F3162 Bipolar disorder, current episode mixed, moderate: Secondary | ICD-10-CM

## 2022-01-22 NOTE — Telephone Encounter (Signed)
Appt tomorrow.

## 2022-01-23 ENCOUNTER — Ambulatory Visit (INDEPENDENT_AMBULATORY_CARE_PROVIDER_SITE_OTHER): Payer: Medicare HMO | Admitting: Psychiatry

## 2022-01-23 ENCOUNTER — Encounter: Payer: Self-pay | Admitting: Psychiatry

## 2022-01-23 DIAGNOSIS — F411 Generalized anxiety disorder: Secondary | ICD-10-CM | POA: Diagnosis not present

## 2022-01-23 DIAGNOSIS — F314 Bipolar disorder, current episode depressed, severe, without psychotic features: Secondary | ICD-10-CM

## 2022-01-23 DIAGNOSIS — F338 Other recurrent depressive disorders: Secondary | ICD-10-CM | POA: Diagnosis not present

## 2022-01-23 DIAGNOSIS — G3184 Mild cognitive impairment, so stated: Secondary | ICD-10-CM | POA: Diagnosis not present

## 2022-01-23 DIAGNOSIS — F3162 Bipolar disorder, current episode mixed, moderate: Secondary | ICD-10-CM | POA: Diagnosis not present

## 2022-01-23 DIAGNOSIS — F900 Attention-deficit hyperactivity disorder, predominantly inattentive type: Secondary | ICD-10-CM

## 2022-01-23 MED ORDER — QUETIAPINE FUMARATE 300 MG PO TABS: 300.0000 mg | ORAL_TABLET | Freq: Every day | ORAL | 1 refills | Status: DC

## 2022-01-23 MED ORDER — LAMOTRIGINE 100 MG PO TABS: ORAL_TABLET | ORAL | 1 refills | Status: DC

## 2022-01-23 MED ORDER — RISPERIDONE 1 MG PO TABS: 1.0000 mg | ORAL_TABLET | Freq: Two times a day (BID) | ORAL | 0 refills | Status: DC

## 2022-01-23 NOTE — Progress Notes (Signed)
------------------------------- [id number] 10/18/1956 66 y.o.  Video Visit via My Chart  I connected with pt by My Chart and verified that I am speaking with the correct person using two identifiers.   I discussed the limitations, risks, security and privacy concerns of performing an evaluation and management service by My Chart  and the availability of in person appointments. I also discussed with the patient that there may be a patient responsible charge related to this service. The patient expressed understanding and agreed to proceed.  I discussed the assessment and treatment plan with the patient. The patient was provided an opportunity to ask questions and all were answered. The patient agreed with the plan and demonstrated an understanding of the instructions.   The patient was advised to call back or seek an in-person evaluation if the symptoms worsen or if the condition fails to improve as anticipated.  I provided 20 minutes of video time during this encounter.  The patient was located at home and the provider was located office. Session from 1 until 120  Subjective:   Patient ID:  Reginald Tucker is a 66 y.o. (DOB [birth year]) male.  Chief Complaint:  Chief Complaint  Patient presents with   Follow-up    Anxiety Symptoms include shortness of breath. Patient reports no chest pain, confusion, decreased concentration, nervous/anxious behavior or suicidal ideas.    Depression        Associated symptoms include no decreased concentration, no fatigue, no headaches and no suicidal ideas.  Past medical history includes anxiety.   Reginald Tucker presents to the office today for follow-up of TRD and anxiety.  When seen April 06, 2019.  He had not seen any mood benefit from low-dose pramipexole and had side effects at higher dosages.  Therefore we weaned him off of the that medication.  visit August 07, 2019 and the following changes were made: Option retry Vraylar with selegiline  which wasn't adequately done before.  Prior trial inadequate duration and SE Retry Vraylar at a lower dosage to prevent tremors: 1 capsule every Monday, Wednesday, Friday only. Made him jittery and stopped after a week.  Didn't see mood benefit.  October 2020 was last appointment and there were no med changes as the patient was going to seek a second opinion.  The following was noted: Still irritable and real tired.  Easily fatigued with normal activity.  Appt with PCP Oct 27 to evaluate. Not much tremor. Waves of depression worse in the morning.  Sleeping too much in recliner and lethargic.  Deep sleep.  Takes 60 min to fall asleep.  Cant' shut off his brain and hard to fall asleep.  Reads a lot and enjoys it especially history.  Pt reports that mood is Anxious, Depressed and Irritable rated 7/10 bad and describes anxiety as Moderate and occassional.  Less anxiety this visit than depression..  Staying in the house is making it worse too.   Anxiety symptoms include: Excessive Worry, Panic Symptoms, Social Anxiety,. Has claustrophobia.  Hard to ride elevators .  Can't go up the steps here.  Pt reports no sleep issues. Pt reports that appetite is good. Pt reports that energy is poor and anhedonia, loss of interest or pleasure in usual activities, poor motivation and withdrawn from usual activities. Concentration is down slightly. Suicidal thoughts:  denied by patient.  Still has days that does very little.  Will clean occ.   10/17/2020 appointment with the following noted:  Wife joined Patient has requested refills  from this location but has not been seen here in a year which is not appropriate given his level of instability historically. Didn't go for second opinion since here.   Still doing about the same without much change.  Some days are better than others. Wife says he does some things and will go out sometimes.  Doesn't want to go out at dark.  Thinks winter is the worst time. Asked about  [place]. Tolerating meds OK.   Chronically depressed and easily irritable, without pattern except brief. Wife says some weeks are worse than others. No walking or exercise.  Balance issues.  Was better when did PT but he quit the exercises. Patient is highly claustrophobic and states is getting worse.  He is having trouble using the elevator to get to our office.  He asked about finding another psychiatrist who has an office on the first floor and we discussed options.  Normal card review recently.  02/21/21 appt noted: W involved in call. Very sick with covid pneumonia and nearly died in 2023-01-13 and still recovering. Doing breathing exercises and some walking.  Weak in am. A lot of depression in [facility]ital thinking he would die.  Now dep 7/10.   No change in mood off selegiline per pt or Reginald Tucker but she's not sure. Concerta didn't help and they agree.  He stopped it and went back on selegiline. Memory is bad. Taking melatonin 10 comes and goes with benefit.  Takes 1 and 1/2 hours to go to sleep. To bed 1110 and up at 9-930 Plan: DC selegiline   Wait 5 days to clear and start MPH ER 36 mg each AM.   If NR after 2 weeks, then ER increase if they call.  03/29/2021 phone call from wife reporting that Caplyta was working and they wanted more samples while we worked on a [place]  04/24/2021 appointment with the following noted:  Wife on session also Took MPH and couldn't tell a difference except wife said he was more anxious.  Stopped it. Caplyta 42 mg daily and now says he's worse.  Not doing well at all.  Wonders if he's worse bc of the reduction in Seroquel.  Going through hell for 3-4 weeks with 8/10 depression.  No SI. Getting up earlier. More anxiety and hard to lay down. Came to a head with wife saying she's leaving for 24 days to help her daughter with a child.  She was gone 10 days in [facility].  Inconsistency in life.  Better if going to church but usually doesn't do it. Got saved my life from [place] DT failure. Return to Seroquel 400 bc worse without it. He wants to use risperidone 1 mg BID prn anxiety.  06/21/2021 appointment with the following noted:  Wife on call Only increased Seroquel to 300 mg HS. Doing great and about 3/10 depression.  Started improving pretty quickly.  Went back to church and out with friends.  W gone 3 weeks.  Going to Bible study.  Got together with friends he hasn't seen  in 4 years. I feel good. No SE. Sleep good usually.  Not sleeping excessively as much as in the past about 8-9 hours.  Anxiety is etter not gone.   Trying to walk without a cane.  Recent PCP was unremarkable.  Lost to 218#.    09/26/21 appt noted: wife also on call Great.  Gym 5-6 days per week. Lost 25#.  Getting out more.  Involved in church.  Feels better than last time and more active and socializing.  Mostly out of depression 1-2/10. No SE.  Better with queiapine 300 vs 400 re: sedation. Wife agrees he's dramatically better all around. Needs and gets 8-9 hours nightly and 2 brief naps daily. Anxiety is manageable and risperidone helps.  Risperidone also helps keep down the level of irritability which is occasional and manageable. Plan: Continue Seroquel 300 mg HS Continue risperidone 1 mg twice daily as he is feels it is helping Continue lamotrigine 200 mg twice daily   01/23/22 appt noted: Still doing fine with depression with some seasonal blues.  Involved in couple of Bible studies.  Walking and reading books.  Getting out and doing things.  Men's and couples conferences. [employer]. Does chores.  Good health. Depression 2/10.  Sleep pretty good with occ initial insomnia.  Restorative.   No SE problems  Sleep study negative for OSA noted on chart.  Past Psychiatric Medication Trials: He has had multiple psych med failures as well .   Past psychiatric medications used include pramipexole NR, sertraline,  Trintellix 1.5 mg MWF jittery,  selegiline max  30mg  daily, Paxil, Wellbutrin, lamotrigine, carbamazepine, buspirone,  lithium with a tremor,  Depakote was side effects of feeling heavy and increased ammonia,   Seroquel 800 mg a day, Latuda 120 mg a day,  Rexulti,  Perphenazine,  Vraylar 3mg  akathisia, He did not tolerate Vraylar even at 1.5 mg 3 days a week.  risperidone, olanzapine, quetiapine Caplyta 42 NR modafinil,  Adderall with selegiline with BP problems, Concerta 54 NR Light therapy failure. ECT twice, failed 2nd time. Affectively better  after adding pramipexole to 0.5 mg twice daily and increasing Seroquel to 600 mg daily but lost response apparently soon thereafter.  Review of Systems:  Review of Systems  Constitutional:  Negative for fatigue.  Respiratory:  Positive for shortness of breath.   Cardiovascular:  Negative for chest pain.  Musculoskeletal:  Positive for back pain.  Neurological:  Negative for tremors, weakness and headaches.  Psychiatric/Behavioral:  Negative for agitation, behavioral problems, confusion, decreased concentration, dysphoric mood, hallucinations, self-injury, sleep disturbance and suicidal ideas. The patient is not nervous/anxious and is not hyperactive.  Occurs 2-3 times/weekre: leg weakness.  Not orthostatic.  Medications: I have reviewed the patient's current medications.  Current Outpatient Medications  Medication Sig Dispense Refill   Ascorbic Acid (VITAMIN C) 1000 MG tablet Take 1,000 mg by mouth daily.     aspirin EC 81 MG tablet Take 81 mg by mouth daily.     Cholecalciferol (VITAMIN D3 PO) Take 15,000 Units by mouth daily.      L-THEANINE PO Take by mouth.     levothyroxine (SYNTHROID, LEVOTHROID) 150 MCG tablet Take 150 mcg by mouth daily before breakfast.     lovastatin (MEVACOR) 40 MG tablet Take 40 mg by mouth daily.      MAGNESIUM CITRATE PO Take 1-2 tablets by mouth 2 (two) times daily. 400 mg, 2 tabs in the morning, 1 tab at bedtime     MAGNESIUM MALATE PO Take by mouth.      Methylcobalamin 1 MG CHEW Chew 1 tablet by mouth daily.     ofloxacin (OCUFLOX) 0.3 % ophthalmic solution Instill 1 drop into the right eye every 3 hours-  0   Omega-3 Fatty Acids (SUPER OMEGA 3 PO) Take 1 capsule by mouth 2 (two) times daily.      omeprazole (PRILOSEC) 20 MG capsule Take 20 mg by mouth  daily.     OVER THE COUNTER MEDICATION Place 1 Squirt under the tongue at bedtime. Hemp oil  500 mg     zinc gluconate 50 MG tablet Take 50 mg by mouth daily.     lamoTRIgine (LAMICTAL) 100 MG tablet TAKE 2 TABLETS(200 MG) BY MOUTH TWICE DAILY 360 tablet 1   QUEtiapine (SEROQUEL) 100 MG tablet TAKE 1 TABLET(100 MG) BY MOUTH AT BEDTIME (Patient not taking: Reported on 01/23/2022) 90 tablet 0   QUEtiapine (SEROQUEL) 300 MG tablet Take 1 tablet (300 mg total) by mouth at bedtime. 90 tablet 1   risperiDONE (RISPERDAL) 1 MG tablet Take 1 tablet (1 mg total) by mouth 2 (two) times daily. 180 tablet 0   No current facility-administered medications for this visit.    Medication Side Effects:got nervous and irritable with 1.5 mg pramipexole in morning, ok with it split up and no change in mood.  Allergies:  Allergies  Allergen Reactions   Sulfa Antibiotics Other (See Comments)    UNSPECIFIED REACTION OF CHILDHOOD   Sulfamethoxazole Other (See Comments)    UNSPECIFIED REACTION OF CHILDHOOD   Zolpidem Tartrate Anxiety      Nervous, uncontrollable    Past Medical History:  Diagnosis Date   Acquired hallux rigidus of right foot 04/26/2017   AKI (acute kidney injury) ([place]) 06/11/2017   Anxiety    Bipolar 1 disorder (HCC)    BMI 37.0-37.9, adult    BPH (benign prostatic hyperplasia)    Cataract    Cataract    L eye   CKD (chronic kidney disease), stage III (HCC)    Colon polyps    COPD GOLD II with restrictive component  01/13/2016   Spirometry 01/13/2016  FEV1 1.84 (47%)  Ratio 62  - 01/13/2016  extensive coaching HFA effectiveness =    90% > try stiolto respimat 2 pffs each am > did not benefit  so stopped when sample out - 01/13/2016  Walked RA x 3 laps @ 185 ft each stopped due to  End of study, nl pace, no desat  / min sob  - PFT's  03/16/2016  FEV1 2.28 (59 % ) ratio 67  p 12 % improvement from saba p no prior to study with DLCO  66 % corrects to 86 % for alv volume      Depression    Dysrhythmia    Essential hypertension 01/19/2015   Family history of coronary arteriosclerosis 01/19/2015   Father with MI    Fatty liver    GERD (gastroesophageal reflux disease) 08/11/2014   History of colon polyps 06/18/2017   Hyperlipidemia    Hypertension    Hypothyroidism 08/11/2014   Hypothyroidism    Insomnia 06/18/2017   Insomnia    Memory change    Mixed hyperlipidemia 06/18/2017   Morbid obesity ([place]) 0/08/3234   Complicated by HBP/ Low erv on pfts 03/16/2016 (31%)     Morbid obesity due to excess calories (HCC)    Prediabetes    Sleep apnea    SOB (shortness of breath)    Thyroid disease    Tinnitus of both ears 06/18/2017   Tobacco use 06/18/2017   Unsteadiness on feet    Vitamin D deficiency 06/18/2017   Vitamin D deficiency     Family History  Problem Relation Age of Onset   Cancer Mother    Hyperlipidemia Father    Hypertension Father    CAD Father    Stroke Father    Aortic aneurysm Father  Prostate cancer Father    Hyperlipidemia Brother    Appendicitis Maternal Grandfather     Social History   Socioeconomic History   Marital status: Married    Spouse name: Not on file   Number of children: Not on file   Years of education: Not on file   Highest education level: Associate degree: occupational, Hotel manager, or vocational program  Occupational History   Occupation: act.  assist  Tobacco Use   Smoking status: Former    Packs/day: 1.00    Years: 25.00    Pack years: 25.00    Types: Cigarettes    Quit date: 12/10/2000    Years since quitting: 21.1   Smokeless tobacco: Never   Tobacco comments:    heavy vape user- quit vaping 06/04/2017  Vaping Use   Vaping Use:  Former  Substance and Sexual Activity   Alcohol use: No    Alcohol/week: 0.0 standard drinks   Drug use: No   Sexual activity: Not on file  Other Topics Concern   Not on file  Social History Narrative   Admitted to [facility] 06/14/17- discharged   Lives at home with his wife   Married - Reginald Tucker   Former smoker - stopped 2002   Alcohol none   Full code   Right handed   Drinks 2 cups of caffeine daily   Social Determinants of Radio broadcast assistant Strain: Not on file  Food Insecurity: Not on file  Transportation Needs: Not on file  Physical Activity: Not on file  Stress: Not on file  Social Connections: Not on file  Intimate Partner Violence: Not on file    Past Medical History, Surgical history, Social history, and Family history were reviewed and updated as appropriate.   Please see review of systems for further details on the patient's review from today.   Objective:   Physical Exam:  There were no vitals taken for this visit.  Physical Exam Neurological:     Mental Status: He is alert and oriented to person, place, and time.     Cranial Nerves: No dysarthria.  Psychiatric:        Attention and Perception: Attention and perception normal.        Mood and Affect: Mood is not anxious or depressed. Affect is not blunt or tearful.        Speech: Speech normal.        Behavior: Behavior is cooperative.        Thought Content: Thought content normal. Thought content is not paranoid or delusional. Thought content does not include homicidal or suicidal ideation. Thought content does not include suicidal plan.        Cognition and Memory: Cognition and memory normal.        Judgment: Judgment normal.     Comments: Insight intact Markedly bettter.  Mild occ irritability   Lab Review:     Component Value Date/Time   NA 140 08/13/2018 1020   K 4.3 08/13/2018 1020   CL 107 08/13/2018 1020   CO2 26 08/13/2018 1020   GLUCOSE 99 08/13/2018 1020   BUN 13 08/13/2018  1020   CREATININE 1.14 08/13/2018 1020   CALCIUM 9.4 08/13/2018 1020   PROT 6.9 02/11/2018 0623   ALBUMIN 4.2 02/11/2018 0623   AST 25 02/11/2018 0623   ALT 33 02/11/2018 0623   ALKPHOS 89 02/11/2018 0623   BILITOT 0.9 02/11/2018 0623   GFRNONAA >60 08/13/2018 1020   GFRAA >60 08/13/2018  1020       Component Value Date/Time   WBC 6.8 08/13/2018 1020   RBC 5.29 08/13/2018 1020   HGB 16.5 08/13/2018 1020   HCT 50.5 08/13/2018 1020   PLT 192 08/13/2018 1020   MCV 95.5 08/13/2018 1020   MCH 31.2 08/13/2018 1020   MCHC 32.7 08/13/2018 1020   RDW 12.0 08/13/2018 1020   LYMPHSABS 1.3 08/13/2018 1020   MONOABS 0.7 08/13/2018 1020   EOSABS 0.1 08/13/2018 1020   BASOSABS 0.1 08/13/2018 1020    No results found for: POCLITH, LITHIUM   No results found for: PHENYTOIN, PHENOBARB, VALPROATE, CBMZ   .res Assessment: Plan:    Bipolar 1 disorder, mixed, moderate ([place]) - Plan: risperiDONE (RISPERDAL) 1 MG tablet, lamoTRIgine (LAMICTAL) 100 MG tablet  Seasonal depression (HCC)  Generalized anxiety disorder  Attention deficit hyperactivity disorder (ADHD), predominantly inattentive type  Mild cognitive impairment  Severe bipolar I disorder with depression ([place]) - Plan: QUEtiapine (SEROQUEL) 300 MG tablet    Severe TRD and anxiety.   Greater than 50% of 30 min non face to face time with patient was spent on counseling and coordination of care. Disc history of TRD.  He is markedly better at this point on quetiapine 300 mg nightly and lamotrigine 200 mg twice daily and risperidone 1 mg twice daily.  He is not needing anxiety medicines or sleep medicines.  He is tolerating the medications well. Also we discussed at length that the increased and his physical and social activity and spiritual activity is clearly having a marked positive effect on his mood as well.  It is unlikely for medication alone to maintain him with regard to his mood.  He has a history of multiple recurrences and  very unstable bipolar disorder.  He agrees to try to keep continue his positive social and physical behaviors.  He is also added gym 5 days a week which is clearly helping.  No change indicated.   Continue Seroquel 300 mg HS Continue risperidone 1 mg twice daily as he is feels it is helping Continue lamotrigine 200 mg twice daily   Discussed potential metabolic side effects associated with atypical antipsychotics, as well as potential risk for movement side effects. Advised pt to contact office if movement side effects occur.   FU 5  mos  Lynder Parents, MD, DFAPA    No future appointments.   No orders of the defined types were placed in this encounter.      -------------------------------

## 2022-02-05 ENCOUNTER — Ambulatory Visit: Payer: Self-pay | Admitting: Surgery

## 2022-02-05 DIAGNOSIS — K635 Polyp of colon: Secondary | ICD-10-CM | POA: Diagnosis not present

## 2022-02-05 DIAGNOSIS — K5909 Other constipation: Secondary | ICD-10-CM | POA: Diagnosis not present

## 2022-02-05 DIAGNOSIS — Z86010 Personal history of colonic polyps: Secondary | ICD-10-CM | POA: Diagnosis not present

## 2022-02-05 DIAGNOSIS — R739 Hyperglycemia, unspecified: Secondary | ICD-10-CM

## 2022-02-05 DIAGNOSIS — Z8719 Personal history of other diseases of the digestive system: Secondary | ICD-10-CM | POA: Diagnosis not present

## 2022-02-05 DIAGNOSIS — Z9889 Other specified postprocedural states: Secondary | ICD-10-CM | POA: Diagnosis not present

## 2022-02-20 NOTE — Progress Notes (Addendum)
COVID Vaccine Completed: no ?Date COVID Vaccine completed: ?Has received booster: ?COVID vaccine manufacturer: [employer]  ? ?Date of COVID positive in last 90 days: no ? ?PCP - [name], MD ?Cardiologist - n/a ? ?Chest x-ray - n/a ?EKG - 02/21/22 Epic/chart ?Stress Test - 2016 ?ECHO - n/a ?Cardiac Cath - n/a ?Pacemaker/ICD device last checked: n/a ?Spinal Cord Stimulator: n/a ? ?Bowel Prep - Miralax, neomycin, Flagyl, liquids day before ? ?Sleep Study - yes, negative  ?CPAP -  ? ?Fasting Blood Sugar - pre no checks at home, diet controlled  ?Checks Blood Sugar _____ times a day ? ?Blood Thinner Instructions: ?Aspirin Instructions: ASA 81, pt has instructions, can't remember time ?Last Dose: ? ?Activity level: Can go up a flight of stairs and perform activities of daily living without stopping and without symptoms of chest pain or shortness of breath. ?   ?Anesthesia review: HTN, COPD, CKD, fatty liver, pre DM, SOB ? ?Patient denies shortness of breath, fever, cough and chest pain at PAT appointment ? ? ?Patient verbalized understanding of instructions that were given to them at the PAT appointment. Patient was also instructed that they will need to review over the PAT instructions again at home before surgery.  ?

## 2022-02-20 NOTE — Patient Instructions (Addendum)
DUE TO COVID-19 ONLY ONE VISITOR  (aged 68 and older)  IS ALLOWED TO COME WITH YOU AND STAY IN THE WAITING ROOM ONLY DURING PRE OP AND PROCEDURE.   ?**NO VISITORS ARE ALLOWED IN THE SHORT STAY AREA OR RECOVERY ROOM!!** ? ?IF YOU WILL BE ADMITTED INTO THE HOSPITAL YOU ARE ALLOWED ONLY TWO SUPPORT PEOPLE DURING VISITATION HOURS ONLY (7 AM -8PM)   ?The support person(s) must pass our screening, gel in and out, and wear a mask at all times, including in the patient?s room. ?Patients must also wear a mask when staff or their support person are in the room. ?Visitors GUEST BADGE MUST BE WORN VISIBLY  ?One adult visitor may remain with you overnight and MUST be in the room by 8 P.M. ?     ? Your procedure is scheduled on: 03/02/22 ? ? Report to [facility] Main Entrance ? ?  Report to admitting at 12:45 PM ? ? Call this number if you have problems the morning of surgery [phone] ? ? Follow a clear liquid diet the day before surgery ? ? After Midnight you may have the following liquids until 12:00 PM DAY OF SURGERY ? ?Water ?Black Coffee (sugar ok, NO MILK/CREAM OR CREAMERS)  ?Tea (sugar ok, NO MILK/CREAM OR CREAMERS) regular and decaf                             ?Plain Jell-O (NO RED)                                           ?Fruit ices (not with fruit pulp, NO RED)                                     ?Popsicles (NO RED)                                                                  ?Juice: apple, WHITE grape, WHITE cranberry ?Sports drinks like Gatorade (NO RED) ?Clear broth(vegetable,chicken,beef) ? ?             ?Drink 2 Ensure/G2 drinks AT 10:00 PM the night before surgery.   ? ?  ?  ?The day of surgery:  ?Drink ONE (1) Pre-Surgery G2 at 12:00 PM the morning of surgery. Drink in one sitting. Do not sip.  ?This drink was given to you during your hospital  ?pre-op appointment visit. ?Nothing else to drink after completing the  ?Pre-Surgery G2. ?  ?       If you have questions, please contact your surgeon?s  office. ? ? ?FOLLOW BOWEL PREP AND ANY ADDITIONAL PRE OP INSTRUCTIONS YOU RECEIVED FROM YOUR SURGEON'S OFFICE!!! ?  ?  ?Oral Hygiene is also important to reduce your risk of infection.                                    ?Remember - BRUSH YOUR TEETH THE MORNING OF SURGERY  WITH YOUR REGULAR TOOTHPASTE ? ? Take these medicines the morning of surgery with A SIP OF WATER: Lamictal, Levothyroxine, Omeprazole, Risperidone  ?                  ?           You may not have any metal on your body including jewelry, and body piercing ? ?           Do not wear lotions, powders, cologne, or deodorant ? ?            Men may shave face and neck. ? ? Do not bring valuables to the hospital. [facility] NOT ?            RESPONSIBLE   FOR VALUABLES. ? ? Contacts, dentures or bridgework may not be worn into surgery. ? ? Bring small overnight bag day of surgery. ? ? Special Instructions: Bring a copy of your healthcare power of attorney and living will documents         the day of surgery if you haven't scanned them before. ? ?            Please read over the following fact sheets you were given: [place] [phone]- Apolonio Schneiders ? ?   [facility] - Preparing for Surgery ?Before surgery, you can play an important role.  Because skin is not sterile, your skin needs to be as free of germs as possible.  You can reduce the number of germs on your skin by washing with CHG (chlorahexidine gluconate) soap before surgery.  CHG is an antiseptic cleaner which kills germs and bonds with the skin to continue killing germs even after washing. ?Please DO NOT use if you have an allergy to CHG or antibacterial soaps.  If your skin becomes reddened/irritated stop using the CHG and inform your nurse when you arrive at Short Stay. ?Do not shave (including legs and underarms) for at least 48 hours prior to the first CHG shower.  You may shave your face/neck. ? ?Please follow these instructions  carefully: ? 1.  Shower with CHG Soap the night before surgery and the  morning of surgery. ? 2.  If you choose to wash your hair, wash your hair first as usual with your normal  shampoo. ? 3.  After you shampoo, rinse your hair and body thoroughly to remove the shampoo.                            ? 4.  Use CHG as you would any other liquid soap.  You can apply chg directly to the skin and wash.  Gently with a scrungie or clean washcloth. ? 5.  Apply the CHG Soap to your body ONLY FROM THE NECK DOWN.   Do   not use on face/ open      ?                     Wound or open sores. Avoid contact with eyes, ears mouth and   genitals (private parts).  ?                     Production manager,  Genitals (private parts) with your normal soap. ?            6.  Wash thoroughly, paying special attention to the area  where your    surgery  will be performed. ? 7.  Thoroughly rinse your body with warm water from the neck down. ? 8.  DO NOT shower/wash with your normal soap after using and rinsing off the CHG Soap. ?               9.  Pat yourself dry with a clean towel. ?           10.  Wear clean pajamas. ?           11.  Place clean sheets on your bed the night of your first shower and do not  sleep with pets. ?Day of Surgery : ?Do not apply any lotions/deodorants the morning of surgery.  Please wear clean clothes to the hospital/surgery center. ? ?FAILURE TO FOLLOW THESE INSTRUCTIONS MAY RESULT IN THE CANCELLATION OF YOUR SURGERY ? ?PATIENT SIGNATURE_________________________________ ? ?NURSE SIGNATURE__________________________________ ? ?________________________________________________________________________  ? ?Incentive Spirometer ? ?An incentive spirometer is a tool that can help keep your lungs clear and active. This tool measures how well you are filling your lungs with each breath. Taking long deep breaths may help reverse or decrease the chance of developing breathing (pulmonary) problems (especially infection) following: ?A long  period of time when you are unable to move or be active. ?BEFORE THE PROCEDURE  ?If the spirometer includes an indicator to show your best effort, your nurse or respiratory therapist will set it to a desired goal. ?If possible, sit up straight or lean slightly forward. Try not to slouch. ?Hold the incentive spirometer in an upright position. ?INSTRUCTIONS FOR USE  ?Sit on the edge of your bed if possible, or sit up as far as you can in bed or on a chair. ?Hold the incentive spirometer in an upright position. ?Breathe out normally. ?Place the mouthpiece in your mouth and seal your lips tightly around it. ?Breathe in slowly and as deeply as possible, raising the piston or the ball toward the top of the column. ?Hold your breath for 3-5 seconds or for as long as possible. Allow the piston or ball to fall to the bottom of the column. ?Remove the mouthpiece from your mouth and breathe out normally. ?Rest for a few seconds and repeat Steps 1 through 7 at least 10 times every 1-2 hours when you are awake. Take your time and take a few normal breaths between deep breaths. ?The spirometer may include an indicator to show your best effort. Use the indicator as a goal to work toward during each repetition. ?After each set of 10 deep breaths, practice coughing to be sure your lungs are clear. If you have an incision (the cut made at the time of surgery), support your incision when coughing by placing a pillow or rolled up towels firmly against it. ?Once you are able to get out of bed, walk around indoors and cough well. You may stop using the incentive spirometer when instructed by your caregiver.  ?RISKS AND COMPLICATIONS ?Take your time so you do not get dizzy or light-headed. ?If you are in pain, you may need to take or ask for pain medication before doing incentive spirometry. It is harder to take a deep breath if you are having pain. ?AFTER USE ?Rest and breathe slowly and easily. ?It can be helpful to keep track of a log  of your progress. Your caregiver can provide you with a simple table to help with this. ?If you are using the spirometer at home, follow these  instructions: ?SEEK MEDICAL CARE IF:  ?You are having difficultl

## 2022-02-21 ENCOUNTER — Other Ambulatory Visit: Payer: Self-pay

## 2022-02-21 ENCOUNTER — Encounter (HOSPITAL_COMMUNITY): Payer: Self-pay

## 2022-02-21 ENCOUNTER — Encounter (HOSPITAL_COMMUNITY)
Admission: RE | Admit: 2022-02-21 | Discharge: 2022-02-21 | Disposition: A | Discharge status: Home / Self Care | Payer: Medicare HMO | Source: Ambulatory Visit | Attending: Surgery | Admitting: Surgery

## 2022-02-21 VITALS — BP 139/91 | HR 88 | Temp 98.4°F | Resp 16 | Ht 71.0 in | Wt 188.0 lb

## 2022-02-21 DIAGNOSIS — R739 Hyperglycemia, unspecified: Secondary | ICD-10-CM | POA: Diagnosis not present

## 2022-02-21 DIAGNOSIS — Z01818 Encounter for other preprocedural examination: Secondary | ICD-10-CM | POA: Insufficient documentation

## 2022-02-21 DIAGNOSIS — I10 Essential (primary) hypertension: Secondary | ICD-10-CM | POA: Insufficient documentation

## 2022-02-21 LAB — CBC
HCT: 50.7 % (ref 39.0–52.0)
Hemoglobin: 16.7 g/dL (ref 13.0–17.0)
MCH: 31.3 pg (ref 26.0–34.0)
MCHC: 32.9 g/dL (ref 30.0–36.0)
MCV: 94.9 fL (ref 80.0–100.0)
Platelets: 204 10*3/uL (ref 150–400)
RBC: 5.34 MIL/uL (ref 4.22–5.81)
RDW: 12.4 % (ref 11.5–15.5)
WBC: 5.5 10*3/uL (ref 4.0–10.5)
nRBC: 0 % (ref 0.0–0.2)

## 2022-02-21 LAB — BASIC METABOLIC PANEL
Anion gap: 6 (ref 5–15)
BUN: 17 mg/dL (ref 8–23)
CO2: 28 mmol/L (ref 22–32)
Calcium: 9.5 mg/dL (ref 8.9–10.3)
Chloride: 104 mmol/L (ref 98–111)
Creatinine, Ser: 1.07 mg/dL (ref 0.61–1.24)
GFR, Estimated: >60 mL/min (ref >60)
Glucose, Bld: 118 mg/dL — ABNORMAL HIGH (ref 70–99)
Potassium: 4.8 mmol/L (ref 3.5–5.1)
Sodium: 138 mmol/L (ref 135–145)

## 2022-02-21 LAB — GLUCOSE, CAPILLARY: Glucose-Capillary: 117 mg/dL — ABNORMAL HIGH (ref 70–99)

## 2022-02-22 LAB — HEMOGLOBIN A1C
Hgb A1c MFr Bld: 5.1 % (ref 4.8–5.6)
Mean Plasma Glucose: 100 mg/dL

## 2022-03-02 ENCOUNTER — Encounter (HOSPITAL_COMMUNITY)
Admission: RE | Disposition: A | Discharge status: Home / Self Care | Payer: Self-pay | Source: Home / Self Care | Attending: Surgery

## 2022-03-02 ENCOUNTER — Encounter (HOSPITAL_COMMUNITY): Payer: Self-pay | Admitting: Surgery

## 2022-03-02 ENCOUNTER — Inpatient Hospital Stay (HOSPITAL_COMMUNITY): Payer: Medicare HMO | Admitting: Anesthesiology

## 2022-03-02 ENCOUNTER — Ambulatory Visit (HOSPITAL_COMMUNITY)
Admission: RE | Admit: 2022-03-02 | Discharge: 2022-03-02 | Disposition: A | Discharge status: Home / Self Care | Payer: Medicare HMO | Attending: Anesthesiology | Admitting: Anesthesiology

## 2022-03-02 ENCOUNTER — Inpatient Hospital Stay (HOSPITAL_COMMUNITY): Payer: Medicare HMO | Admitting: Physician Assistant

## 2022-03-02 ENCOUNTER — Other Ambulatory Visit: Payer: Self-pay

## 2022-03-02 DIAGNOSIS — F909 Attention-deficit hyperactivity disorder, unspecified type: Secondary | ICD-10-CM | POA: Insufficient documentation

## 2022-03-02 DIAGNOSIS — Z86010 Personal history of colonic polyps: Secondary | ICD-10-CM | POA: Diagnosis not present

## 2022-03-02 DIAGNOSIS — K5909 Other constipation: Secondary | ICD-10-CM | POA: Diagnosis not present

## 2022-03-02 DIAGNOSIS — Z87891 Personal history of nicotine dependence: Secondary | ICD-10-CM | POA: Diagnosis not present

## 2022-03-02 DIAGNOSIS — I10 Essential (primary) hypertension: Secondary | ICD-10-CM | POA: Diagnosis not present

## 2022-03-02 DIAGNOSIS — F319 Bipolar disorder, unspecified: Secondary | ICD-10-CM | POA: Diagnosis not present

## 2022-03-02 DIAGNOSIS — K635 Polyp of colon: Secondary | ICD-10-CM | POA: Diagnosis not present

## 2022-03-02 DIAGNOSIS — D121 Benign neoplasm of appendix: Secondary | ICD-10-CM | POA: Diagnosis not present

## 2022-03-02 DIAGNOSIS — G4730 Sleep apnea, unspecified: Secondary | ICD-10-CM | POA: Insufficient documentation

## 2022-03-02 DIAGNOSIS — E039 Hypothyroidism, unspecified: Secondary | ICD-10-CM | POA: Diagnosis not present

## 2022-03-02 DIAGNOSIS — K388 Other specified diseases of appendix: Secondary | ICD-10-CM | POA: Diagnosis not present

## 2022-03-02 DIAGNOSIS — F419 Anxiety disorder, unspecified: Secondary | ICD-10-CM | POA: Diagnosis not present

## 2022-03-02 DIAGNOSIS — Z9889 Other specified postprocedural states: Secondary | ICD-10-CM | POA: Insufficient documentation

## 2022-03-02 DIAGNOSIS — F418 Other specified anxiety disorders: Secondary | ICD-10-CM

## 2022-03-02 DIAGNOSIS — K219 Gastro-esophageal reflux disease without esophagitis: Secondary | ICD-10-CM | POA: Insufficient documentation

## 2022-03-02 HISTORY — PX: LAPAROSCOPIC APPENDECTOMY: SHX408

## 2022-03-02 LAB — GLUCOSE, CAPILLARY: Glucose-Capillary: 118 mg/dL — ABNORMAL HIGH (ref 70–99)

## 2022-03-02 LAB — TYPE AND SCREEN
ABO/RH(D): O POS
Antibody Screen: NEGATIVE

## 2022-03-02 LAB — ABO/RH: ABO/RH(D): O POS

## 2022-03-02 SURGERY — APPENDECTOMY, LAPAROSCOPIC
Anesthesia: General

## 2022-03-02 MED ORDER — SUGAMMADEX SODIUM 200 MG/2ML IV SOLN
INTRAVENOUS | Status: DC | PRN
  Administered 2022-03-02: 200 mg via INTRAVENOUS

## 2022-03-02 MED ORDER — GABAPENTIN 300 MG PO CAPS
ORAL_CAPSULE | ORAL | Status: AC
  Administered 2022-03-02: 300 mg via ORAL
  Filled 2022-03-02: qty 1

## 2022-03-02 MED ORDER — ENOXAPARIN SODIUM 40 MG/0.4ML IJ SOSY
40.0000 mg | PREFILLED_SYRINGE | Freq: Once | INTRAMUSCULAR | Status: AC
  Administered 2022-03-02: 40 mg via SUBCUTANEOUS
  Filled 2022-03-02: qty 0.4

## 2022-03-02 MED ORDER — BISACODYL 5 MG PO TBEC
20.0000 mg | DELAYED_RELEASE_TABLET | Freq: Once | ORAL | Status: AC
  Administered 2022-03-02: 20 mg via ORAL

## 2022-03-02 MED ORDER — ONDANSETRON HCL 4 MG/2ML IJ SOLN: 4.0000 mg | Freq: Once | INTRAMUSCULAR | Status: DC | PRN

## 2022-03-02 MED ORDER — ALVIMOPAN 12 MG PO CAPS
ORAL_CAPSULE | ORAL | Status: AC
  Administered 2022-03-02: 12 mg via ORAL
  Filled 2022-03-02: qty 1

## 2022-03-02 MED ORDER — LIDOCAINE HCL (PF) 2 % IJ SOLN
INTRAMUSCULAR | Status: AC
  Filled 2022-03-02: qty 5

## 2022-03-02 MED ORDER — ONDANSETRON HCL 4 MG/2ML IJ SOLN
INTRAMUSCULAR | Status: AC
  Filled 2022-03-02: qty 2

## 2022-03-02 MED ORDER — FENTANYL CITRATE (PF) 100 MCG/2ML IJ SOLN
INTRAMUSCULAR | Status: DC | PRN
  Administered 2022-03-02: 100 ug via INTRAVENOUS
  Administered 2022-03-02: 50 ug via INTRAVENOUS

## 2022-03-02 MED ORDER — FENTANYL CITRATE PF 50 MCG/ML IJ SOSY
PREFILLED_SYRINGE | INTRAMUSCULAR | Status: AC
  Filled 2022-03-02: qty 3

## 2022-03-02 MED ORDER — ORAL CARE MOUTH RINSE: 15.0000 mL | Freq: Once | OROMUCOSAL | Status: AC

## 2022-03-02 MED ORDER — ACETAMINOPHEN 325 MG PO TABS: 325.0000 mg | ORAL_TABLET | ORAL | Status: DC | PRN

## 2022-03-02 MED ORDER — ONDANSETRON HCL 4 MG/2ML IJ SOLN
INTRAMUSCULAR | Status: DC | PRN
  Administered 2022-03-02: 4 mg via INTRAVENOUS

## 2022-03-02 MED ORDER — PROPOFOL 10 MG/ML IV BOLUS
INTRAVENOUS | Status: AC
  Filled 2022-03-02: qty 20

## 2022-03-02 MED ORDER — FENTANYL CITRATE (PF) 250 MCG/5ML IJ SOLN
INTRAMUSCULAR | Status: AC
  Filled 2022-03-02: qty 5

## 2022-03-02 MED ORDER — BUPIVACAINE-EPINEPHRINE (PF) 0.25% -1:200000 IJ SOLN
INTRAMUSCULAR | Status: AC
  Filled 2022-03-02: qty 30

## 2022-03-02 MED ORDER — BUPIVACAINE LIPOSOME 1.3 % IJ SUSP: 20.0000 mL | Freq: Once | INTRAMUSCULAR | Status: DC

## 2022-03-02 MED ORDER — OXYCODONE HCL 5 MG/5ML PO SOLN: 5.0000 mg | Freq: Once | ORAL | Status: DC | PRN

## 2022-03-02 MED ORDER — ENSURE PRE-SURGERY PO LIQD: 296.0000 mL | Freq: Once | ORAL | Status: DC

## 2022-03-02 MED ORDER — BUPIVACAINE-EPINEPHRINE 0.25% -1:200000 IJ SOLN
INTRAMUSCULAR | Status: DC | PRN
  Administered 2022-03-02: 30 mL

## 2022-03-02 MED ORDER — ACETAMINOPHEN 160 MG/5ML PO SOLN: 325.0000 mg | ORAL | Status: DC | PRN

## 2022-03-02 MED ORDER — BUPIVACAINE LIPOSOME 1.3 % IJ SUSP
INTRAMUSCULAR | Status: DC | PRN
Start: 2022-03-02 — End: 2022-03-03
  Administered 2022-03-02: 20 mL

## 2022-03-02 MED ORDER — FENTANYL CITRATE PF 50 MCG/ML IJ SOSY: 25.0000 ug | PREFILLED_SYRINGE | INTRAMUSCULAR | Status: DC | PRN

## 2022-03-02 MED ORDER — SODIUM CHLORIDE 0.9 % IV SOLN
2.0000 g | INTRAVENOUS | Status: AC
  Administered 2022-03-02: 2 g via INTRAVENOUS
  Filled 2022-03-02: qty 2

## 2022-03-02 MED ORDER — CHLORHEXIDINE GLUCONATE 0.12 % MT SOLN
15.0000 mL | Freq: Once | OROMUCOSAL | Status: AC
  Administered 2022-03-02: 15 mL via OROMUCOSAL

## 2022-03-02 MED ORDER — OXYCODONE HCL 5 MG PO TABS: 5.0000 mg | ORAL_TABLET | Freq: Once | ORAL | Status: DC | PRN

## 2022-03-02 MED ORDER — LACTATED RINGERS IR SOLN
Status: DC | PRN
  Administered 2022-03-02: 1000 mL

## 2022-03-02 MED ORDER — DEXAMETHASONE SODIUM PHOSPHATE 4 MG/ML IJ SOLN
INTRAMUSCULAR | Status: DC | PRN
  Administered 2022-03-02: 5 mg via INTRAVENOUS

## 2022-03-02 MED ORDER — ENSURE PRE-SURGERY PO LIQD
592.0000 mL | Freq: Once | ORAL | Status: DC
  Administered 2022-03-02: 592 mL via ORAL

## 2022-03-02 MED ORDER — NEOMYCIN SULFATE 500 MG PO TABS: 1000.0000 mg | ORAL_TABLET | ORAL | Status: DC

## 2022-03-02 MED ORDER — LIDOCAINE 2% (20 MG/ML) 5 ML SYRINGE
INTRAMUSCULAR | Status: DC | PRN
  Administered 2022-03-02: 40 mg via INTRAVENOUS

## 2022-03-02 MED ORDER — PHENYLEPHRINE 40 MCG/ML (10ML) SYRINGE FOR IV PUSH (FOR BLOOD PRESSURE SUPPORT)
PREFILLED_SYRINGE | INTRAVENOUS | Status: DC | PRN
  Administered 2022-03-02 (×2): 120 ug via INTRAVENOUS

## 2022-03-02 MED ORDER — PROPOFOL 10 MG/ML IV BOLUS
INTRAVENOUS | Status: DC | PRN
  Administered 2022-03-02: 150 mg via INTRAVENOUS

## 2022-03-02 MED ORDER — ACETAMINOPHEN 500 MG PO TABS
ORAL_TABLET | ORAL | Status: AC
  Administered 2022-03-02: 1000 mg via ORAL
  Filled 2022-03-02: qty 2

## 2022-03-02 MED ORDER — TRAMADOL HCL 50 MG PO TABS: 50.0000 mg | ORAL_TABLET | Freq: Four times a day (QID) | ORAL | 0 refills | Status: DC | PRN

## 2022-03-02 MED ORDER — ACETAMINOPHEN 500 MG PO TABS: 1000.0000 mg | ORAL_TABLET | Freq: Once | ORAL | Status: AC

## 2022-03-02 MED ORDER — METRONIDAZOLE 500 MG PO TABS: 1000.0000 mg | ORAL_TABLET | ORAL | Status: DC

## 2022-03-02 MED ORDER — BUPIVACAINE LIPOSOME 1.3 % IJ SUSP
INTRAMUSCULAR | Status: AC
  Filled 2022-03-02: qty 20

## 2022-03-02 MED ORDER — PHENYLEPHRINE 40 MCG/ML (10ML) SYRINGE FOR IV PUSH (FOR BLOOD PRESSURE SUPPORT)
PREFILLED_SYRINGE | INTRAVENOUS | Status: AC
  Filled 2022-03-02: qty 10

## 2022-03-02 MED ORDER — ACETAMINOPHEN 500 MG PO TABS: 1000.0000 mg | ORAL_TABLET | ORAL | Status: DC

## 2022-03-02 MED ORDER — ROCURONIUM BROMIDE 10 MG/ML (PF) SYRINGE
PREFILLED_SYRINGE | INTRAVENOUS | Status: DC | PRN
  Administered 2022-03-02: 70 mg via INTRAVENOUS

## 2022-03-02 MED ORDER — MEPERIDINE HCL 50 MG/ML IJ SOLN: 6.2500 mg | INTRAMUSCULAR | Status: DC | PRN

## 2022-03-02 MED ORDER — ROCURONIUM BROMIDE 10 MG/ML (PF) SYRINGE
PREFILLED_SYRINGE | INTRAVENOUS | Status: AC
  Filled 2022-03-02: qty 10

## 2022-03-02 MED ORDER — GABAPENTIN 300 MG PO CAPS: 300.0000 mg | ORAL_CAPSULE | ORAL | Status: AC

## 2022-03-02 MED ORDER — LACTATED RINGERS IV SOLN: INTRAVENOUS | Status: DC

## 2022-03-02 MED ORDER — POLYETHYLENE GLYCOL 3350 17 GM/SCOOP PO POWD: 1.0000 | Freq: Once | ORAL | Status: DC

## 2022-03-02 MED ORDER — DEXAMETHASONE SODIUM PHOSPHATE 10 MG/ML IJ SOLN
INTRAMUSCULAR | Status: AC
  Filled 2022-03-02: qty 1

## 2022-03-02 MED ORDER — ALVIMOPAN 12 MG PO CAPS: 12.0000 mg | ORAL_CAPSULE | ORAL | Status: AC

## 2022-03-02 SURGICAL SUPPLY — 92 items
APPLIER CLIP 5 13 M/L LIGAMAX5 (MISCELLANEOUS)
APPLIER CLIP ROT 10 11.4 M/L (STAPLE)
BAG COUNTER SPONGE SURGICOUNT (BAG) ×1 IMPLANT
CABLE HIGH FREQUENCY MONO STRZ (ELECTRODE) ×2 IMPLANT
CELLS DAT CNTRL 66122 CELL SVR (MISCELLANEOUS) IMPLANT
CHLORAPREP W/TINT 26 (MISCELLANEOUS) ×2 IMPLANT
CLIP APPLIE 5 13 M/L LIGAMAX5 (MISCELLANEOUS) IMPLANT
CLIP APPLIE ROT 10 11.4 M/L (STAPLE) IMPLANT
COUNTER NEEDLE 20 DBL MAG RED (NEEDLE) ×2 IMPLANT
COVER MAYO STAND STRL (DRAPES) ×6 IMPLANT
COVER SURGICAL LIGHT HANDLE (MISCELLANEOUS) ×2 IMPLANT
DEVICE TROCAR PUNCTURE CLOSURE (ENDOMECHANICALS) IMPLANT
DRAIN CHANNEL 19F RND (DRAIN) IMPLANT
DRAPE LAPAROSCOPIC ABDOMINAL (DRAPES) ×2 IMPLANT
DRAPE SURG IRRIG POUCH 19X23 (DRAPES) ×2 IMPLANT
DRAPE WARM FLUID 44X44 (DRAPES) ×2 IMPLANT
DRSG OPSITE POSTOP 4X10 (GAUZE/BANDAGES/DRESSINGS) IMPLANT
DRSG OPSITE POSTOP 4X6 (GAUZE/BANDAGES/DRESSINGS) IMPLANT
DRSG OPSITE POSTOP 4X8 (GAUZE/BANDAGES/DRESSINGS) IMPLANT
DRSG TEGADERM 2-3/8X2-3/4 SM (GAUZE/BANDAGES/DRESSINGS) ×4 IMPLANT
DRSG TEGADERM 4X4.75 (GAUZE/BANDAGES/DRESSINGS) ×2 IMPLANT
ELECT REM PT RETURN 15FT ADLT (MISCELLANEOUS) ×2 IMPLANT
ENDOLOOP SUT PDS II  0 18 (SUTURE)
ENDOLOOP SUT PDS II 0 18 (SUTURE) IMPLANT
EVACUATOR SILICONE 100CC (DRAIN) IMPLANT
GAUZE SPONGE 2X2 8PLY STRL LF (GAUZE/BANDAGES/DRESSINGS) ×1 IMPLANT
GAUZE SPONGE 4X4 12PLY STRL (GAUZE/BANDAGES/DRESSINGS) ×2 IMPLANT
GLOVE SURG NEOPR MICRO LF SZ8 (GLOVE) ×4 IMPLANT
GLOVE SURG UNDER LTX SZ8 (GLOVE) ×4 IMPLANT
GOWN STRL REUS W/ TWL XL LVL3 (GOWN DISPOSABLE) ×5 IMPLANT
GOWN STRL REUS W/TWL XL LVL3 (GOWN DISPOSABLE) ×5
IRRIG SUCT STRYKERFLOW 2 WTIP (MISCELLANEOUS) ×2
IRRIGATION SUCT STRKRFLW 2 WTP (MISCELLANEOUS) ×1 IMPLANT
KIT BASIN OR (CUSTOM PROCEDURE TRAY) ×2 IMPLANT
KIT TURNOVER KIT A (KITS) IMPLANT
LEGGING LITHOTOMY PAIR STRL (DRAPES) IMPLANT
PACK COLON (CUSTOM PROCEDURE TRAY) ×2 IMPLANT
PAD POSITIONING PINK XL (MISCELLANEOUS) ×2 IMPLANT
PENCIL SMOKE EVACUATOR (MISCELLANEOUS) IMPLANT
POUCH RETRIEVAL ECOSAC 10 (ENDOMECHANICALS) ×1 IMPLANT
POUCH RETRIEVAL ECOSAC 10MM (ENDOMECHANICALS) ×1
PROTECTOR NERVE ULNAR (MISCELLANEOUS) IMPLANT
RELOAD STAPLE 60 3.6 BLU REG (STAPLE) IMPLANT
RELOAD STAPLE 60 4.1 GRN THCK (STAPLE) IMPLANT
RELOAD STAPLER BLUE 60MM (STAPLE) ×2 IMPLANT
RELOAD STAPLER GREEN 60MM (STAPLE) IMPLANT
RETRACTOR WND ALEXIS 18 MED (MISCELLANEOUS) IMPLANT
RTRCTR WOUND ALEXIS 18CM MED (MISCELLANEOUS)
SCISSORS LAP 5X35 DISP (ENDOMECHANICALS) ×2 IMPLANT
SEALER TISSUE G2 STRG ARTC 35C (ENDOMECHANICALS) IMPLANT
SET TUBE SMOKE EVAC HIGH FLOW (TUBING) ×2 IMPLANT
SHEARS HARMONIC ACE PLUS 36CM (ENDOMECHANICALS) IMPLANT
SLEEVE ADV FIXATION 5X100MM (TROCAR) IMPLANT
SLEEVE XCEL OPT CAN 5 100 (ENDOMECHANICALS) ×2 IMPLANT
SPIKE FLUID TRANSFER (MISCELLANEOUS) ×2 IMPLANT
SPONGE GAUZE 2X2 STER 10/PKG (GAUZE/BANDAGES/DRESSINGS) ×1
SPONGE T-LAP 18X18 ~~LOC~~+RFID (SPONGE) IMPLANT
STAPLE ECHEON FLEX 60 POW ENDO (STAPLE) ×1 IMPLANT
STAPLER RELOAD BLUE 60MM (STAPLE) ×4
STAPLER RELOAD GREEN 60MM (STAPLE)
STAPLER VISISTAT 35W (STAPLE) IMPLANT
SURGILUBE 2OZ TUBE FLIPTOP (MISCELLANEOUS) IMPLANT
SUT MNCRL AB 4-0 PS2 18 (SUTURE) ×2 IMPLANT
SUT PDS AB 0 CT1 36 (SUTURE) IMPLANT
SUT PDS AB 1 CT1 27 (SUTURE) IMPLANT
SUT PDS AB 1 CTX 36 (SUTURE) ×4 IMPLANT
SUT PDS AB 1 TP1 96 (SUTURE) IMPLANT
SUT PROLENE 0 CT 2 (SUTURE) IMPLANT
SUT PROLENE 2 0 SH DA (SUTURE) IMPLANT
SUT SILK 2 0 (SUTURE) ×1
SUT SILK 2 0 SH (SUTURE) IMPLANT
SUT SILK 2 0 SH CR/8 (SUTURE) ×2 IMPLANT
SUT SILK 2-0 18XBRD TIE 12 (SUTURE) ×1 IMPLANT
SUT SILK 3 0 (SUTURE) ×1
SUT SILK 3 0 SH CR/8 (SUTURE) ×2 IMPLANT
SUT SILK 3-0 18XBRD TIE 12 (SUTURE) ×1 IMPLANT
SUT VICRYL 0 UR6 27IN ABS (SUTURE) ×2 IMPLANT
SYS LAPSCP GELPORT 120MM (MISCELLANEOUS)
SYS WOUND ALEXIS 18CM MED (MISCELLANEOUS)
SYSTEM LAPSCP GELPORT 120MM (MISCELLANEOUS) IMPLANT
SYSTEM WOUND ALEXIS 18CM MED (MISCELLANEOUS) IMPLANT
TAPE UMBILICAL 1/8 X36 TWILL (MISCELLANEOUS) ×2 IMPLANT
TOWEL OR 17X26 10 PK STRL BLUE (TOWEL DISPOSABLE) ×2 IMPLANT
TOWEL OR NON WOVEN STRL DISP B (DISPOSABLE) ×2 IMPLANT
TRAY FOLEY MTR SLVR 14FR STAT (SET/KITS/TRAYS/PACK) ×2 IMPLANT
TRAY FOLEY MTR SLVR 16FR STAT (SET/KITS/TRAYS/PACK) IMPLANT
TRAY LAPAROSCOPIC (CUSTOM PROCEDURE TRAY) ×2 IMPLANT
TROCAR ADV FIXATION 5X100MM (TROCAR) IMPLANT
TROCAR BLADELESS OPT 5 100 (ENDOMECHANICALS) ×2 IMPLANT
TROCAR XCEL 12X100 BLDLESS (ENDOMECHANICALS) ×2 IMPLANT
TROCAR XCEL NON-BLD 11X100MML (ENDOMECHANICALS) IMPLANT
TUBING CONNECTING 10 (TUBING) IMPLANT

## 2022-03-02 NOTE — Op Note (Signed)
?PATIENT:  Reginald Tucker  66 y.o. male ? ?Patient Care Team: ?[name], MD as PCP - General (Family Medicine) ?Michael Boston, MD as Consulting Physician (General Surgery) ?Otis Brace, MD as Consulting Physician (Gastroenterology) ?Cottle, Billey Co., MD as Attending Physician (Psychiatry) ?Otis Brace, MD as Consulting Physician (Gastroenterology) ? ?PRE-OPERATIVE DIAGNOSIS:  POLYP OF APPENDICEAL ORIFACE OF COLON ? ?POST-OPERATIVE DIAGNOSIS:  POLYP OF APPENDICEAL ORIFACE OF COLON ? ?PROCEDURE:   ?LAPAROSCOPIC APPENDECTOMY with PARTIAL RESECTION OF CECIAL WALL ?TRANSVERSUS ABDOMINIS PLANE (TAP) BLOCK - BILATERAL ? ?SURGEON:  Adin Hector, MD ? ?ANESTHESIA:   local and general ? ?EBL:  Total I/O ?In: 100 [IV Piggyback:100] ?Out: 160 [Urine:150; Blood:10] ? ?Delay start of Pharmacological VTE agent (>24hrs) due to surgical blood loss or risk of bleeding:  no ? ?DRAINS: none  ? ?SPECIMEN:   APPENDIX WITH CECAL WALL ? ?DISPOSITION OF SPECIMEN:  PATHOLOGY ? ?COUNTS:  YES ? ?PLAN OF CARE: Discharge to home after PACU ? ?PATIENT DISPOSITION:  PACU - hemodynamically stable. ? ? ?INDICATIONS: ?Patient with concerning symptoms & work up suspicious for polyp at the appendiceal orifice of the cecum.  Not amenable to endoscopic resection.  Dr. Alessandra Bevels requested surgical evaluation.  Surgery was recommended: ? ?The anatomy & physiology of the digestive tract was discussed.  The pathophysiology of appendicitis was discussed.  Natural history risks without surgery was discussed.   I feel the risks of no intervention will lead to serious problems that outweigh the operative risks; therefore, I recommended diagnostic laparoscopy with removal of appendix and part of the cecal colon wall to remove the pathology.  Possible need for ileocolonic resection.  Laparoscopic & open techniques were discussed.   I noted a good likelihood this will help address the problem.   ? ?Risks such as bleeding, infection,  abscess, leak, reoperation, possible ostomy, hernia, heart attack, death, and other risks were discussed.  Goals of post-operative recovery were discussed as well.  We will work to minimize complications.  Questions were answered.  The patient expresses understanding & wishes to proceed with surgery. ? ?OR FINDINGS: Patient had a very long but narrow appendix.  Patient had a polypoid mass up inside the appendix about 5 cm from the appendiceal orifice within the appendix itself.  Margins negative.  No evidence of any appendicitis, perforation, lymphadenopathy, other abnormalities. ? ?CASE DATA: ? ?Type of patient?: Elective [facility] Private Case ? ?Status of Case? Elective Scheduled ? ?Infection Present At Time Of Surgery (PATOS)?  NO ? ?DESCRIPTION:  ? ?The patient was identified & brought into the operating room. The patient was positioned supine with arms tucked. SCDs were active during the entire case. The patient underwent general anesthesia without any difficulty.  The abdomen was prepped and draped in a sterile fashion. A Surgical Timeout confirmed our plan. ? ?I made a transverse incision through the superior umbilical fold.  I made a small transverse nick through the supraumbilical fascia and confirmed peritoneal entry.  I placed a 12m port.  We induced carbon dioxide insufflation.  Camera inspection revealed no injury.  I placed additional ports under direct laparoscopic visualization.   ? ?I freed some omental adhesions around prior infraumbilical hernia repair.  Also for adhesions of the ileocecal region of the anterior abdominal wall until it came down towards the natural right paracolic gutter.  I mobilized the terminal ileum to proximal ascending colon in a lateral to medial fashion.  I took care to avoid injuring any retroperitoneal structures.  I freed the appendix off its attachments to the ascending colon and cecal mesentery.  I elevated the appendix. I skeletonized the mesoappendix. I was able to free  off the base of the appendix and several centimeters of cecal wall.  I stapled the appendix off the cecum using a laparoscopic stapler x 2 firings. I took a healthy cuff of viable cecum. I ligated the mesoappendix and assured hemostasis in the mesentery.  I placed the appendix inside an EcoSac bag and removed out the 76m stapler port. ? ?I opened the appendix on the back table along the staple line and confirmed I had over a centimeter margin around the appendiceal orifice.  I could intussuscept the mucosa of the appendix to confirm that there was a polyp up within the appendix itself.  Margins negative.  No perforation or other abnormalities.  Therefore, I do not feel more aggressive resection was needed ? ?I did copious irrigation. Hemostasis was good in the mesoappendix, colon mesentery, and retroperitoneum. Staple line was intact on the cecum with no bleeding. I washed out the pelvis, retrohepatic space and right paracolic gutter. I washed out the left side as well.  Hemostasis is good. There was no perforation or injury.  I aspirated the carbon dioxide. I removed the ports.  I closed the 12 mm stapler port site with a #1 PDS stitch.  I closed skin using 4-0 monocryl stitch.  Sterile dressings applied. ? ?Patient was extubated and sent to the recovery room.  I discussed the operative findings with the patient's spouse, LLinzy Darling  Questions answered. She expressed understanding and appreciation. ? ?SAdin Hector M.D., F.A.C.S. ?Gastrointestinal and Minimally Invasive Surgery ?[facility] ?[address] ?[place] [id number]?[phone] / Paging ? ?03/02/2022 ?4:49 PM ? ?

## 2022-03-02 NOTE — Anesthesia Preprocedure Evaluation (Deleted)
Anesthesia Evaluation  ? ? ?Airway ? ? ? ? ? ? ? Dental ?  ?Pulmonary ?former smoker,  ?  ? ? ? ? ? ? ? Cardiovascular ?hypertension,  ? ? ?  ?Neuro/Psych ?  ? GI/Hepatic ?  ?Endo/Other  ? ? Renal/GU ?  ? ?  ?Musculoskeletal ? ? Abdominal ?  ?Peds ? Hematology ?  ?Anesthesia Other Findings ? ? Reproductive/Obstetrics ? ?  ? ? ? ? ? ? ? ? ? ? ? ? ? ?  ?  ? ? ? ? ? ? ? ? ?Anesthesia Physical ?Anesthesia Plan ?Anesthesia Quick Evaluation                                  Anesthesia Evaluation  ?Patient identified by MRN, date of birth, ID band ?Patient awake ? ? ? ?Reviewed: ?Allergy & Precautions, NPO status , Patient's Chart, lab work & pertinent test results ? ?Airway ?Mallampati: II ? ?TM Distance: >3 FB ?Neck ROM: Full ? ? ? Dental ?no notable dental hx. ?(+) Poor Dentition,  ?  ?Pulmonary ?sleep apnea , former smoker,  ?  ?Pulmonary exam normal ?breath sounds clear to auscultation ? ? ? ? ? ? Cardiovascular ?hypertension, Normal cardiovascular exam ?Rhythm:Regular Rate:Normal ? ?Cardiac eval June 2019 by Dr. Wynonia Lawman. Myoview showed no evidence of ischemia or infarction, normal wall motion. TTE showed moderate concentric LVH, normal wall motion, EF 60%, trace MR, trace TR, mild aortic root dilation. ?  ?Neuro/Psych ?Bipolar Disorder   ? GI/Hepatic ?GERD  ,  ?Endo/Other  ?Hypothyroidism  ? Renal/GU ?  ? ?  ?Musculoskeletal ? ? Abdominal ?  ?Peds ? Hematology ?  ?Anesthesia Other Findings ? ? Reproductive/Obstetrics ? ?  ? ? ? ? ? ? ? ? ? ? ? ? ? ?  ?  ? ? ? ? ? ? ? ?Lab Results  ?Component Value Date  ? WBC 6.8 08/13/2018  ? HGB 16.5 08/13/2018  ? HCT 50.5 08/13/2018  ? MCV 95.5 08/13/2018  ? PLT 192 08/13/2018  ? ? ?Anesthesia Physical ?Anesthesia Plan ? ?ASA: III ? ?Anesthesia Plan: General  ? ?Post-op Pain Management:   ? ?Induction: Intravenous ? ?PONV Risk Score and Plan: Treatment may vary due to age or medical condition, Ondansetron and Dexamethasone ? ?Airway Management  Planned: Oral ETT ? ?Additional Equipment:  ? ?Intra-op Plan:  ? ?Post-operative Plan: Extubation in OR ? ?Informed Consent: I have reviewed the patients History and Physical, chart, labs and discussed the procedure including the risks, benefits and alternatives for the proposed anesthesia with the patient or authorized representative who has indicated his/her understanding and acceptance.  ? ?Dental advisory given ? ?Plan Discussed with: CRNA ? ?Anesthesia Plan Comments:   ? ? ? ? ? ? ?Anesthesia Quick Evaluation ? ? ?

## 2022-03-02 NOTE — Discharge Instructions (Signed)
SURGERY: POST OP INSTRUCTIONS ?(Surgery for small bowel obstruction, colon resection, etc) ? ? ?###################################################################### ? ?EAT ?Gradually transition to a high fiber diet with a fiber supplement over the next few days after discharge ? ?WALK ?Walk an hour a day.  Control your pain to do that.   ? ?CONTROL PAIN ?Control pain so that you can walk, sleep, tolerate sneezing/coughing, go up/down stairs. ? ?HAVE A BOWEL MOVEMENT DAILY ?Keep your bowels regular to avoid problems.  OK to try a laxative to override constipation.  OK to use an antidairrheal to slow down diarrhea.  Call if not better after 2 tries ? ?CALL IF YOU HAVE PROBLEMS/CONCERNS ?Call if you are still struggling despite following these instructions. ?Call if you have concerns not answered by these instructions ? ?###################################################################### ? ? ?DIET ?Follow a light diet the first few days at home.  Start with a bland diet such as soups, liquids, starchy foods, low fat foods, etc.  If you feel full, bloated, or constipated, stay on a ful liquid or pureed/blenderized diet for a few days until you feel better and no longer constipated. ?Be sure to drink plenty of fluids every day to avoid getting dehydrated (feeling dizzy, not urinating, etc.). ?Gradually add a fiber supplement to your diet over the next week.  Gradually get back to a regular solid diet.  Avoid fast food or heavy meals the first week as you are more likely to get nauseated. ?It is expected for your digestive tract to need a few months to get back to normal.  It is common for your bowel movements and stools to be irregular.  You will have occasional bloating and cramping that should eventually fade away.  Until you are eating solid food normally, off all pain medications, and back to regular activities; your bowels will not be normal. ?Focus on eating a low-fat, high fiber diet the rest of your life  (See Getting to [facility]igma, below). ? ?CARE of your INCISION or WOUND ?It is good for closed incision and even open wounds to be washed every day.  Shower every day.  Short baths are fine.  Wash the incisions and wounds clean with soap & water.    ? ?If you have a closed incision(s), wash the incision with soap & water every day.  You may leave closed incisions open to air if it is dry.   You may cover the incision with clean gauze & replace it after your daily shower for comfort. ? ?It is good for closed incisions and even open wounds to be washed every day.  Shower every day.  Short baths are fine.  Wash the incisions and wounds clean with soap & water.    ?You may leave closed incisions open to air if it is dry.   You may cover the incision with clean gauze & replace it after your daily shower for comfort. ? ?TEGADERM:  You have clear gauze band-aid dressings over your closed incision(s).  Remove the dressings 3 days after surgery. ? ? ?If you have an open wound with a wound vac, see wound vac care instructions. ? ? ? ? ?ACTIVITIES as tolerated ?Start light daily activities --- self-care, walking, climbing stairs-- beginning the day after surgery.  Gradually increase activities as tolerated.  Control your pain to be active.  Stop when you are tired.  Ideally, walk several times a day, eventually an hour a day.   ?Most people are back to most day-to-day activities in  a few weeks.  It takes 4-8 weeks to get back to unrestricted, intense activity. ?If you can walk 30 minutes without difficulty, it is safe to try more intense activity such as jogging, treadmill, bicycling, low-impact aerobics, swimming, etc. ?Save the most intensive and strenuous activity for last (Usually 4-8 weeks after surgery) such as sit-ups, heavy lifting, contact sports, etc.  Refrain from any intense heavy lifting or straining until you are off narcotics for pain control.  You will have off days, but things should improve  week-by-week. ?DO NOT PUSH THROUGH PAIN.  Let pain be your guide: If it hurts to do something, don't do it.  Pain is your body warning you to avoid that activity for another week until the pain goes down. ?You may drive when you are no longer taking narcotic prescription pain medication, you can comfortably wear a seatbelt, and you can safely make sudden turns/stops to protect yourself without hesitating due to pain. ?You may have sexual intercourse when it is comfortable. If it hurts to do something, stop. ? ?MEDICATIONS ?Take your usually prescribed home medications unless otherwise directed.   ?Blood thinners:  ?Usually you can restart any strong blood thinners after the second postoperative day.  It is OK to take aspirin right away.    ? If you are on strong blood thinners (warfarin/Coumadin, Plavix, Xerelto, Eliquis, Pradaxa, etc), discuss with your surgeon, medicine PCP, and/or cardiologist for instructions on when to restart the blood thinner & if blood monitoring is needed (PT/INR blood check, etc).   ? ? ?PAIN CONTROL ?Pain after surgery or related to activity is often due to strain/injury to muscle, tendon, nerves and/or incisions.  This pain is usually short-term and will improve in a few months.  ?To help speed the process of healing and to get back to regular activity more quickly, DO THE FOLLOWING THINGS TOGETHER: ?Increase activity gradually.  DO NOT PUSH THROUGH PAIN ?Use Ice and/or Heat ?Try Gentle Massage and/or Stretching ?Take over the counter pain medication ?Take Narcotic prescription pain medication for more severe pain ? ?Good pain control = faster recovery.  It is better to take more medicine to be more active than to stay in bed all day to avoid medications. ? Increase activity gradually ?Avoid heavy lifting at first, then increase to lifting as tolerated over the next 6 weeks. ?Do not ?push through? the pain.  Listen to your body and avoid positions and maneuvers than reproduce the pain.   Wait a few days before trying something more intense ?Walking an hour a day is encouraged to help your body recover faster and more safely.  Start slowly and stop when getting sore.  If you can walk 30 minutes without stopping or pain, you can try more intense activity (running, jogging, aerobics, cycling, swimming, treadmill, sex, sports, weightlifting, etc.) ?Remember: If it hurts to do it, then don?t do it! ?Use Ice and/or Heat ?You will have swelling and bruising around the incisions.  This will take several weeks to resolve. ?Ice packs or heating pads (6-8 times a day, 30-60 minutes at a time) will help sooth soreness & bruising. ?Some people prefer to use ice alone, heat alone, or alternate between ice & heat.  Experiment and see what works best for you.  Consider trying ice for the first few days to help decrease swelling and bruising; then, switch to heat to help relax sore spots and speed recovery. ?Shower every day.  Short baths are fine.  It feels good!  Keep the incisions and wounds clean with soap & water.   ?Try Gentle Massage and/or Stretching ?Massage at the area of pain many times a day ?Stop if you feel pain - do not overdo it ?Take over the counter pain medication ?This helps the muscle and nerve tissues become less irritable and calm down faster ?Choose ONE of the following over-the-counter anti-inflammatory medications: ?Acetaminophen '500mg'$  tabs (Tylenol) 1-2 pills with every meal and just before bedtime (avoid if you have liver problems or if you have acetaminophen in you narcotic prescription) ?Naproxen '220mg'$  tabs (ex. Aleve, Naprosyn) 1-2 pills twice a day (avoid if you have kidney, stomach, IBD, or bleeding problems) ?Ibuprofen '200mg'$  tabs (ex. Advil, Motrin) 3-4 pills with every meal and just before bedtime (avoid if you have kidney, stomach, IBD, or bleeding problems) ?Take with food/snack several times a day as directed for at least 2 weeks to help keep pain / soreness down & more  manageable. ?Take Narcotic prescription pain medication for more severe pain ?A prescription for strong pain control is often given to you upon discharge (for example: oxycodone/Percocet, hydrocodone/Norco/Vicodin, or tramad

## 2022-03-02 NOTE — H&P (Signed)
?03/02/2022 ? ? ?REFERRING PHYSICIAN: Otis Brace, MD ? ?Patient Care Team: ?Ernestine Conrad, MD as PCP - General (Family Medicine) ? ?PROVIDER: Hollace Kinnier, MD ? ?[facility] MRN: [id number] ?DOB: Jun 29, 1956 ?DATE OF ENCOUNTER: 02/05/2022 ? ?SUBJECTIVE  ? ?Chief Complaint: Appendix ? ? ?History of Present Illness: ?Reginald Tucker is a 66 y.o. male who is seen today  ?as an office consultation at the request of Dr. Alessandra Bevels  ?for evaluation of Appendix ?.  ? ?Pleasant patient with bipolar disorder. History of morbid obesity with dyspnea on exertion and COPD. He has had intentional weight loss and is more active. No longer smokes. Here with his wife. History of colon polyps. Underwent colonoscopy for screening purposes by Dr. Alessandra Bevels. Polyps removed. However polyp at the appendiceal orifice of the cecum. Not felt to be safely removed endoscopically. Biopsy consistent with serrated polyp. Surgical consultation requested. ? ?Patient claims he can walk about three quarters of a mile without difficulty. Again no longer smokes not on oxygen. No sleep apnea. No diabetes. He had a primary periumbilical hernia repair by Dr. Hulen Skains in 2017 -no mesh. Suture only. No other abdominal surgeries. He tends to have some issues with constipation but with the help of MiraLAX and occasionally Dulcolax moves his bowels at least every other day ? ?Medical History: ? ?Past Medical History:  ?Diagnosis Date  ? Anxiety  ? COPD (chronic obstructive pulmonary disease) (CMS-HCC)  ? Hyperlipidemia  ? Thyroid disease  ? ?Patient Active Problem List  ?Diagnosis  ? Serrated polyp of colon  ? Hx of umbilical hernia repair  ? History of colonic polyps  ? Chronic constipation  ? ?Past Surgical History:  ?Procedure Laterality Date  ? HERNIA REPAIR  ? ? ?Allergies  ?Allergen Reactions  ? Lithium Analogues Other (See Comments) and Unknown  ?palpatations ? ? Sulfa (Sulfonamide Antibiotics) Other (See Comments), Rash and Unknown  ?Child  allergy ?UNSPECIFIED REACTION OF CHILDHOOD ?Childhood allergy ?Childhood allergy ? ? Zolpidem Anxiety and Other (See Comments)  ?twitches ? ?Nervous, uncontrollable ?Nervous, uncontrollable ? ? ?Current Outpatient Medications on File Prior to Visit  ?Medication Sig Dispense Refill  ? ascorbic acid, vitamin C, (VITAMIN C) 1000 MG tablet Take by mouth  ? aspirin 81 MG chewable tablet 1 tablet  ? cholecalciferol (VITAMIN D3) 1000 unit capsule 1 capsule  ? lamoTRIgine (LAMICTAL) 100 MG tablet  ? levothyroxine (SYNTHROID) 175 MCG tablet Take 1 tablet by mouth once daily  ? lovastatin (MEVACOR) 40 MG tablet TAKE 1 TABLET BY MOUTH EVERY DAY WITH A MEAL  ? omeprazole (PRILOSEC) 20 MG DR capsule Take 20 mg by mouth once daily  ? QUEtiapine (SEROQUEL) 100 MG tablet TAKE 1 TABLET(100 MG) BY MOUTH AT BEDTIME  ? risperiDONE (RISPERDAL) 1 MG tablet 1 tablet  ? zinc gluconate 50 mg tablet Take 50 mg by mouth once daily  ? ?No current facility-administered medications on file prior to visit.  ? ?Family History  ?Problem Relation Age of Onset  ? Ovarian cancer Mother  ? Stroke Father  ? High blood pressure (Hypertension) Father  ? Hyperlipidemia (Elevated cholesterol) Father  ? Coronary Artery Disease (Blocked arteries around heart) Father  ? Hyperlipidemia (Elevated cholesterol) Brother  ? ? ?Social History  ? ?Tobacco Use  ?Smoking Status Former  ? Types: Cigarettes  ? Quit date: 2018  ? Years since quitting: 5.1  ?Smokeless Tobacco Never  ? ? ?Social History  ? ?Socioeconomic History  ? Marital status: Married  ?Tobacco Use  ?  Smoking status: Former  ?Types: Cigarettes  ?Quit date: 2018  ?Years since quitting: 5.1  ? Smokeless tobacco: Never  ?Substance and Sexual Activity  ? Alcohol use: Not Currently  ? Drug use: Not Currently  ? ?############################################################ ? ?Review of Systems: ?A complete review of systems (ROS) was obtained from the patient. I have reviewed this information and discussed as  appropriate with the patient. See HPI as well for other pertinent ROS. ? ?Constitutional: No fevers, chills, sweats. Weight stable ?Eyes: No vision changes, No discharge ?HENT: No sore throats, nasal drainage ?Lymph: No neck swelling, No bruising easily ?Pulmonary: No cough, productive sputum ?CV: No orthopnea, PND Patient walks 20 without difficulty. No exertional chest/neck/shoulder/arm pain. ? ?GI: No personal nor family history of GI/colon cancer, inflammatory bowel disease, irritable bowel syndrome, allergy such as Celiac Sprue, dietary/dairy problems, colitis, ulcers nor gastritis. No recent sick contacts/gastroenteritis. No travel outside the country. No changes in diet. ? ?Renal: No UTIs, No hematuria ?Genital: No drainage, bleeding, masses ?Musculoskeletal: No severe joint pain. Good ROM major joints ?Skin: No sores or lesions ?Heme/Lymph: No easy bleeding. No swollen lymph nodes ? ?OBJECTIVE  ? ?Vitals:  ?02/05/22 1348  ?BP: 110/70  ?Pulse: (!) 140  ?Weight: 87.1 kg (192 lb)  ?Height: 180.3 cm ('5\' 11"'$ )  ? ? ?Body mass index is 26.78 kg/m?. ? ?PHYSICAL EXAM: ? ?Constitutional: Not cachectic. Hygeine adequate. Vitals signs as above.  ?Eyes: Pupils reactive, normal extraocular movements. Sclera nonicteric ?Neuro: CN II-XII intact. No major focal sensory defects. No major motor deficits. ?Lymph: No head/neck/groin lymphadenopathy ?Psych: No severe agitation. No severe anxiety. Judgment & insight Adequate, Oriented x4, ?HENT: Normocephalic, Mucus membranes moist. No thrush. Hearing: adequate ?Neck: Supple, No tracheal deviation. No obvious thyromegaly ?Chest: No pain to chest wall compression. Good respiratory excursion. No audible wheezing ?CV: Pulses intact. Regular rhythm. No major extremity edema ?Ext: No obvious deformity or contracture. Edema: Not present. No cyanosis ?Skin: No major subcutaneous nodules. Warm and dry ?Musculoskeletal: Severe joint rigidity not present. No obvious clubbing. No digital  petechiae. Mobility: no assist device moving easily without restrictions ? ?Abdomen: Flat  Soft. Thin wall with lateral eventration/doughy-ness Nondistended. Nontender. Hernia: Not present. Diastasis recti: Large supraumbilical midline incision. No hepatomegaly. No splenomegaly. ? ?Genital/Pelvic: Inguinal hernia: Not present. Inguinal lymph nodes: without lymphadenopathy nor hidradenitis.  ? ?Rectal: (Deferred) ? ? ? ?################################################################### ? ?Labs, Imaging and Diagnostic Testing: ? ?Located in [place]' section of Epic EMR chart ? ?[facility] NOTES: ? ?Not applicable ? ?SURGERY NOTES: ? ?Not applicable ? ?PATHOLOGY: ? ?Located in [place]' section of Epic EMR chart ? ?Assessment and Plan:  ?DIAGNOSES: ? ?Diagnoses and all orders for this visit: ? ?Serrated polyp of colon ? ?Hx of umbilical hernia repair ? ?History of colonic polyps ? ?Chronic constipation ? ? ? ?ASSESSMENT/PLAN ? ?Polyp at appendiceal orifice of cecum. Biopsy consistent with serrated adenoma. ? ?Looking the pictures, this does not appear to be particularly giant. I think it is reasonable to offer laparoscopic appendectomy with partial cecal wall resection. Try to avoid extraction at umbilicus given prior hernia repair. Open the specimen. If margins are clear and does not look particularly suspicious, stop there. If I cannot get clear margins over there is atypical nodularity or other concerns, then most likely a proximal colectomy. Intracorporeal anastomosis. Just appendectomy, outpatient surgery. If requires a more significant colon resection needs to stay at least a few days until he meets discharge goals. Possibly longer. We will see.  His wife is concerned that he does not have amazing exercise intolerance but it seems like he can walk relatively well. He has intentionally lost weight and no longer smokes. This is not a severely high risk surgery. I do not feel strongly we need  to do more aggressive clearance. She seems mostly reassured. ? ?The anatomy & physiology of the digestive tract was discussed. The pathophysiology of appendiceal polyps and other appendiceal disorders wer

## 2022-03-02 NOTE — Transfer of Care (Signed)
Immediate Anesthesia Transfer of Care Note ? ?Patient: Reginald Tucker ? ?Procedure(s) Performed: LAPAROSCOPIC APPENDECTOMY, PARTIAL RESECTION OF CECIAL WALL ? ?Patient Location: PACU ? ?Anesthesia Type:General ? ?Level of Consciousness: awake, alert  and oriented ? ?Airway & Oxygen Therapy: Patient Spontanous Breathing and Patient connected to face mask oxygen ? ?Post-op Assessment: Report given to RN and Post -op Vital signs reviewed and stable ? ?Post vital signs: Reviewed and stable ? ?Last Vitals:  ?Vitals Value Taken Time  ?BP 156/85 03/02/22 1654  ?Temp    ?Pulse 91 03/02/22 1655  ?Resp 19 03/02/22 1656  ?SpO2 100 % 03/02/22 1655  ?Vitals shown include unvalidated device data. ? ?Last Pain:  ?Vitals:  ? 03/02/22 1348  ?TempSrc:   ?PainSc: 0-No pain  ?   ? ?  ? ?Complications: No notable events documented. ?

## 2022-03-02 NOTE — Anesthesia Procedure Notes (Signed)
Procedure Name: Intubation ?Date/Time: 03/02/2022 3:59 PM ?Performed by: Claudia Desanctis, CRNA ?Pre-anesthesia Checklist: Patient identified, Emergency Drugs available, Suction available and Patient being monitored ?Patient Re-evaluated:Patient Re-evaluated prior to induction ?Oxygen Delivery Method: Circle system utilized ?Preoxygenation: Pre-oxygenation with 100% oxygen ?Induction Type: IV induction ?Ventilation: Mask ventilation without difficulty ?Laryngoscope Size: 2 and Miller ?Grade View: Grade I ?Tube type: Oral ?Tube size: 8.0 mm ?Number of attempts: 1 ?Airway Equipment and Method: Stylet ?Placement Confirmation: ETT inserted through vocal cords under direct vision, positive ETCO2 and breath sounds checked- equal and bilateral ?Secured at: 21 cm ?Tube secured with: Tape ?Dental Injury: Teeth and Oropharynx as per pre-operative assessment  ? ? ? ? ?

## 2022-03-02 NOTE — Anesthesia Preprocedure Evaluation (Addendum)
Anesthesia Evaluation  ?Patient identified by MRN, date of birth, ID band ?Patient awake ? ? ? ?Reviewed: ?Allergy & Precautions, NPO status , Patient's Chart, lab work & pertinent test results ? ?Airway ?Mallampati: II ? ?TM Distance: >3 FB ?Neck ROM: Full ? ? ? Dental ?no notable dental hx. ?(+) Poor Dentition, Partial Upper, Partial Lower,  ?  ?Pulmonary ?sleep apnea , former smoker,  ?Spirometry 01/13/2016  FEV1 1.84 (47%)  Ratio 62  - 01/13/2016  extensive coaching HFA effectiveness =    90% > try stiolto respimat 2 pffs each am > did not benefit so stopped when sample out - 01/13/2016  Walked RA x 3 laps @ 185 ft each stopped due to  End of study, nl pace, no desat  / min sob  - PFT's  03/16/2016  FEV1 2.28 (59 % ) ratio 67  p 12 % improvement from saba p no prior to study with DLCO  66 % corrects to 86 % for alv volume    ?  ?Pulmonary exam normal ?breath sounds clear to auscultation ? ? ? ? ? ? Cardiovascular ?hypertension, Pt. on medications ?Normal cardiovascular exam+ dysrhythmias  ?Rhythm:Regular Rate:Normal ? ?Cardiac eval June 2019 by Dr. Wynonia Lawman. Myoview showed no evidence of ischemia or infarction, normal wall motion. TTE showed moderate concentric LVH, normal wall motion, EF 60%, trace MR, trace TR, mild aortic root dilation. ?  ?Neuro/Psych ?PSYCHIATRIC DISORDERS Anxiety Depression Bipolar Disorder   ? GI/Hepatic ?GERD  ,  ?Endo/Other  ?Hypothyroidism  ? Renal/GU ?  ? ?  ?Musculoskeletal ? ? Abdominal ?  ?Peds ? Hematology ?  ?Anesthesia Other Findings ? ? Reproductive/Obstetrics ? ?  ? ? ? ? ? ? ? ? ? ? ? ? ? ?  ?  ? ? ? ? ? ? ? ?Lab Results  ?Component Value Date  ? WBC 5.5 02/21/2022  ? HGB 16.7 02/21/2022  ? HCT 50.7 02/21/2022  ? MCV 94.9 02/21/2022  ? PLT 204 02/21/2022  ? ? ?Anesthesia Physical ? ?Anesthesia Plan ? ?ASA: 3 ? ?Anesthesia Plan: General  ? ?Post-op Pain Management: Tylenol PO (pre-op)* and Celebrex PO (pre-op)*  ? ?Induction: Intravenous ? ?PONV Risk  Score and Plan: Treatment may vary due to age or medical condition, Ondansetron and Dexamethasone ? ?Airway Management Planned: Oral ETT ? ?Additional Equipment:  ? ?Intra-op Plan:  ? ?Post-operative Plan: Extubation in OR ? ?Informed Consent: I have reviewed the patients History and Physical, chart, labs and discussed the procedure including the risks, benefits and alternatives for the proposed anesthesia with the patient or authorized representative who has indicated his/her understanding and acceptance.  ? ? ? ?Dental advisory given ? ?Plan Discussed with: CRNA and Anesthesiologist ? ?Anesthesia Plan Comments:   ? ? ? ? ? ?Anesthesia Quick Evaluation ? ?

## 2022-03-02 NOTE — Anesthesia Postprocedure Evaluation (Signed)
Anesthesia Post Note ? ?Patient: Reginald Tucker ? ?Procedure(s) Performed: LAPAROSCOPIC APPENDECTOMY, PARTIAL RESECTION OF CECIAL WALL ? ?  ? ?Patient location during evaluation: PACU ?Anesthesia Type: General ?Level of consciousness: awake and alert ?Pain management: pain level controlled ?Vital Signs Assessment: post-procedure vital signs reviewed and stable ?Respiratory status: spontaneous breathing, nonlabored ventilation, respiratory function stable and patient connected to nasal cannula oxygen ?Cardiovascular status: blood pressure returned to baseline and stable ?Postop Assessment: no apparent nausea or vomiting ?Anesthetic complications: no ? ? ?No notable events documented. ? ?Last Vitals:  ?Vitals:  ? 03/02/22 1700 03/02/22 1706  ?BP: (!) 130/117   ?Pulse:  96  ?Resp: 17 (!) 21  ?Temp:    ?SpO2: 100% 97%  ?  ?Last Pain:  ?Vitals:  ? 03/02/22 1700  ?TempSrc:   ?PainSc: 2   ? ? ?  ?  ?  ?  ?  ?  ? ?[name] ? ? ? ? ?

## 2022-03-03 ENCOUNTER — Encounter (HOSPITAL_COMMUNITY): Payer: Self-pay | Admitting: Surgery

## 2022-03-07 LAB — SURGICAL PATHOLOGY

## 2022-05-29 ENCOUNTER — Ambulatory Visit: Payer: Self-pay | Admitting: Surgery

## 2022-05-29 DIAGNOSIS — K4090 Unilateral inguinal hernia, without obstruction or gangrene, not specified as recurrent: Secondary | ICD-10-CM | POA: Diagnosis not present

## 2022-06-15 ENCOUNTER — Other Ambulatory Visit: Payer: Self-pay | Admitting: Psychiatry

## 2022-06-15 DIAGNOSIS — F3162 Bipolar disorder, current episode mixed, moderate: Secondary | ICD-10-CM

## 2022-06-26 ENCOUNTER — Encounter: Payer: Self-pay | Admitting: Psychiatry

## 2022-06-26 ENCOUNTER — Ambulatory Visit (INDEPENDENT_AMBULATORY_CARE_PROVIDER_SITE_OTHER): Payer: Medicare HMO | Admitting: Psychiatry

## 2022-06-26 DIAGNOSIS — G3184 Mild cognitive impairment, so stated: Secondary | ICD-10-CM | POA: Diagnosis not present

## 2022-06-26 DIAGNOSIS — F411 Generalized anxiety disorder: Secondary | ICD-10-CM

## 2022-06-26 DIAGNOSIS — F900 Attention-deficit hyperactivity disorder, predominantly inattentive type: Secondary | ICD-10-CM | POA: Diagnosis not present

## 2022-06-26 DIAGNOSIS — F338 Other recurrent depressive disorders: Secondary | ICD-10-CM

## 2022-06-26 DIAGNOSIS — F3162 Bipolar disorder, current episode mixed, moderate: Secondary | ICD-10-CM | POA: Diagnosis not present

## 2022-06-26 NOTE — Progress Notes (Addendum)
------------------------------- [id number] [birth year] 66 y.o.  Video Visit via My Chart  I connected with pt by My Chart and verified that I am speaking with the correct person using two identifiers.   I discussed the limitations, risks, security and privacy concerns of performing an evaluation and management service by My Chart  and the availability of in person appointments. I also discussed with the patient that there may be a patient responsible charge related to this service. The patient expressed understanding and agreed to proceed.  I discussed the assessment and treatment plan with the patient. The patient was provided an opportunity to ask questions and all were answered. The patient agreed with the plan and demonstrated an understanding of the instructions.   The patient was advised to call back or seek an in-person evaluation if the symptoms worsen or if the condition fails to improve as anticipated.  I provided 20 minutes of video time during this encounter.  The patient was located at home and the provider was located office. Session from 1 until 130  Subjective:   Patient ID:  Reginald Tucker is a 66 y.o. (DOB December 08, 1956) male.  Chief Complaint:  Chief Complaint  Patient presents with   Follow-up    Bipolar 1 disorder, mixed, moderate (HCC)   Anxiety   ADHD   Depression    Anxiety Symptoms include shortness of breath. Patient reports no chest pain, confusion, decreased concentration, nervous/anxious behavior or suicidal ideas.    Depression        Associated symptoms include no decreased concentration, no fatigue, no headaches and no suicidal ideas.  Past medical history includes anxiety.    Reginald Tucker presents to the office today for follow-up of TRD and anxiety.  When seen April 06, 2019.  He had not seen any mood benefit from low-dose pramipexole and had side effects at higher dosages.  Therefore we weaned him off of the that medication.  visit August 07, 2019 and the following changes were made: Option retry Vraylar with selegiline which wasn't adequately done before.  Prior trial inadequate duration and SE Retry Vraylar at a lower dosage to prevent tremors: 1 capsule every Monday, Wednesday, Friday only. Made him jittery and stopped after a week.  Didn't see mood benefit.  October 2020 was last appointment and there were no med changes as the patient was going to seek a second opinion.  The following was noted: Still irritable and real tired.  Easily fatigued with normal activity.  Appt with PCP Oct 27 to evaluate. Not much tremor. Waves of depression worse in the morning.  Sleeping too much in recliner and lethargic.  Deep sleep.  Takes 60 min to fall asleep.  Cant' shut off his brain and hard to fall asleep.  Reads a lot and enjoys it especially history.  Pt reports that mood is Anxious, Depressed and Irritable rated 7/10 bad and describes anxiety as Moderate and occassional.  Less anxiety this visit than depression..  Staying in the house is making it worse too.   Anxiety symptoms include: Excessive Worry, Panic Symptoms, Social Anxiety,. Has claustrophobia.  Hard to ride elevators .  Can't go up the steps here.  Pt reports no sleep issues. Pt reports that appetite is good. Pt reports that energy is poor and anhedonia, loss of interest or pleasure in usual activities, poor motivation and withdrawn from usual activities. Concentration is down slightly. Suicidal thoughts:  denied by patient.  Still has days that does very little.  Will clean occ.   10/17/2020 appointment with the following noted:  Wife joined Patient has requested refills from this location but has not been seen here in a year which is not appropriate given his level of instability historically. Didn't go for second opinion since here.   Still doing about the same without much change.  Some days are better than others. Wife says he does some things and will go out sometimes.   Doesn't want to go out at dark.  Thinks winter is the worst time. Asked about [place]. Tolerating meds OK.   Chronically depressed and easily irritable, without pattern except brief. Wife says some weeks are worse than others. No walking or exercise.  Balance issues.  Was better when did PT but he quit the exercises. Patient is highly claustrophobic and states is getting worse.  He is having trouble using the elevator to get to our office.  He asked about finding another psychiatrist who has an office on the first floor and we discussed options.  Normal card review recently.  02/21/21 appt noted: W involved in call. Very sick with covid pneumonia and nearly died in 01/07/23 and still recovering. Doing breathing exercises and some walking.  Weak in am. A lot of depression in hsopital thinking he would die.  Now dep 7/10.   No change in mood off selegiline per pt or Reginald Tucker but she's not sure. Concerta didn't help and they agree.  He stopped it and went back on selegiline. Memory is bad. Taking melatonin 10 comes and goes with benefit.  Takes 1 and 1/2 hours to go to sleep. To bed 1110 and up at 9-930 Plan: DC selegiline   Wait 5 days to clear and start [facility]PH ER 36 mg each AM.   If NR after 2 weeks, then ER increase if they call.  03/29/2021 phone call from wife reporting that Caplyta was working and they wanted more samples while we worked on a [place]  04/24/2021 appointment with the following noted:  Wife on session also Took MPH and couldn't tell a difference except wife said he was more anxious.  Stopped it. Caplyta 42 mg daily and now says he's worse.  Not doing well at all.  Wonders if he's worse bc of the reduction in Seroquel.  Going through hell for 3-4 weeks with 8/10 depression.  No SI. Getting up earlier. More anxiety and hard to lay down. Came to a head with wife saying she's leaving for 24 days to help her daughter with a child.  She was gone 10 days in [facility].  Inconsistency in life.  Better if  going to church but usually doesn't do it. Got saved my life from [place] DT failure. Return to Seroquel 400 bc worse without it. He wants to use risperidone 1 mg BID prn anxiety.  06/21/2021 appointment with the following noted:  Wife on call Only increased Seroquel to 300 mg HS. Doing great and about 3/10 depression.  Started improving pretty quickly.  Went back to church and out with friends.  W gone 3 weeks.  Going to Bible study.  Got together with friends he hasn't seen  in 4 years. I feel good. No SE. Sleep good usually.  Not sleeping excessively as much as in the past about 8-9 hours.  Anxiety is etter not gone.   Trying to walk without a cane.  Recent PCP was unremarkable.  Lost to 218#.    09/26/21 appt noted: wife also on  call Great.  Gym 5-6 days per week. Lost 25#.  Getting out more.  Involved in church.  Feels better than last time and more active and socializing.  Mostly out of depression 1-2/10. No SE.  Better with queiapine 300 vs 400 re: sedation. Wife agrees he's dramatically better all around. Needs and gets 8-9 hours nightly and 2 brief naps daily. Anxiety is manageable and risperidone helps.  Risperidone also helps keep down the level of irritability which is occasional and manageable. Plan: Continue Seroquel 300 mg HS Continue risperidone 1 mg twice daily as he is feels it is helping Continue lamotrigine 200 mg twice daily   01/23/22 appt noted: Still doing fine with depression with some seasonal blues.  Involved in couple of Bible studies.  Walking and reading books.  Getting out and doing things.  Men's and couples conferences. [employer]. Does chores.  Good health. Depression 2/10.  Sleep pretty good with occ initial insomnia.  Restorative.   No SE problems Plan: No change indicated.   Continue Seroquel 300 mg HS Continue risperidone 1 mg twice daily as he is feels it is helping Continue lamotrigine 200 mg twice daily  06/26/22  appt noted: I con't have any depression and anxiety No complaints.  Active at church.   Slowed DT hernia.  Surg 8/4 bc bothers him.  Curtailed physcial activity. No SE. Hard to shut off brain at night.  Reads bible.  Takes awhile to go to sleep.  Chronic problem.  Sleep from 1-8 but would like to sleep more.  Sleep study negative for OSA noted on chart.  Past Psychiatric Medication Trials: He has had multiple psych med failures as well .   Past psychiatric medications used include pramipexole NR, sertraline,  Trintellix 1.5 mg MWF jittery,  selegiline max '30mg'$  daily, Paxil, Wellbutrin, lamotrigine, carbamazepine, buspirone,  lithium with a tremor,  Depakote was side effects of feeling heavy and increased ammonia,   Seroquel 800 mg a day, Latuda 120 mg a day,  Rexulti,  Perphenazine,  Vraylar '3mg'$  akathisia, He did not tolerate Vraylar even at 1.5 mg 3 days a week.  risperidone, olanzapine, quetiapine Caplyta 42 NR modafinil,  Adderall with selegiline with BP problems, Concerta 54 NR Light therapy failure. ECT twice, failed 2nd time. Affectively better  after adding pramipexole to 0.5 mg twice daily and increasing Seroquel to 600 mg daily but lost response apparently soon thereafter.  Review of Systems:  Review of Systems  Constitutional:  Negative for fatigue.  Respiratory:  Positive for shortness of breath.   Cardiovascular:  Negative for chest pain.  Gastrointestinal:  Positive for abdominal pain.  Musculoskeletal:  Positive for back pain.  Neurological:  Negative for tremors, weakness and headaches.  Psychiatric/Behavioral:  Positive for depression. Negative for agitation, behavioral problems, confusion, decreased concentration, dysphoric mood, hallucinations, self-injury, sleep disturbance and suicidal ideas. The patient is not nervous/anxious and is not hyperactive.   Occurs 2-3 times/weekre: leg weakness.  Not orthostatic.  Medications: I have reviewed the patient's current  medications.  Current Outpatient Medications  Medication Sig Dispense Refill   aspirin EC 81 MG tablet Take 81 mg by mouth daily.     Cholecalciferol (VITAMIN D3) 25 MCG (1000 UT) CAPS Take 1,000 Units by mouth daily.     L-THEANINE PO Take by mouth.     lamoTRIgine (LAMICTAL) 100 MG tablet TAKE 2 TABLETS(200 MG) BY MOUTH TWICE DAILY 360 tablet 1   levothyroxine (SYNTHROID) 175 MCG tablet Take 175 mcg  by mouth daily before breakfast.     lovastatin (MEVACOR) 40 MG tablet Take 40 mg by mouth daily.      MAGNESIUM PO Take 3 tablets by mouth daily. 3750 mg total     Omega-3 Fatty Acids (SUPER OMEGA 3 PO) Take 1 capsule by mouth daily.     omeprazole (PRILOSEC) 20 MG capsule Take 20 mg by mouth daily.     QUEtiapine (SEROQUEL) 300 MG tablet Take 1 tablet (300 mg total) by mouth at bedtime. 90 tablet 1   risperiDONE (RISPERDAL) 1 MG tablet TAKE 1 TABLET(1 MG) BY MOUTH TWICE DAILY 180 tablet 0   traMADol (ULTRAM) 50 MG tablet Take 1-2 tablets (50-100 mg total) by mouth every 6 (six) hours as needed for moderate pain or severe pain. 20 tablet 0   vitamin B-12 (CYANOCOBALAMIN) 50 MCG tablet Take 50 mcg by mouth daily.     zinc gluconate 50 MG tablet Take 50 mg by mouth daily.     No current facility-administered medications for this visit.    Medication Side Effects:got nervous and irritable with 1.5 mg pramipexole in morning, ok with it split up and no change in mood.  Allergies:  Allergies  Allergen Reactions   Lithium     Makes pt jittery/causes anxiety    Ambien [Zolpidem Tartrate] Anxiety    Nervous, uncontrollable   Sulfa Antibiotics Other (See Comments)    UNSPECIFIED REACTION OF CHILDHOOD   Sulfamethoxazole Other (See Comments)    UNSPECIFIED REACTION OF CHILDHOOD    Past Medical History:  Diagnosis Date   Acquired hallux rigidus of right foot 04/26/2017   AKI (acute kidney injury) ([place]) 06/11/2017   Anxiety    Bipolar 1 disorder (HCC)    BMI 37.0-37.9, adult    BPH (benign  prostatic hyperplasia)    Cataract    Cataract    L eye   CKD (chronic kidney disease), stage III (HCC)    Colon polyps    COPD GOLD II with restrictive component  01/13/2016   Spirometry 01/13/2016  FEV1 1.84 (47%)  Ratio 62  - 01/13/2016  extensive coaching HFA effectiveness =    90% > try stiolto respimat 2 pffs each am > did not benefit so stopped when sample out - 01/13/2016  Walked RA x 3 laps @ 185 ft each stopped due to  End of study, nl pace, no desat  / min sob  - PFT's  03/16/2016  FEV1 2.28 (59 % ) ratio 67  p 12 % improvement from saba p no prior to study with DLCO  66 % corrects to 86 % for alv volume      Depression    Dysrhythmia    Essential hypertension 01/19/2015   Family history of coronary arteriosclerosis 01/19/2015   Father with MI    Fatty liver    GERD (gastroesophageal reflux disease) 08/11/2014   History of colon polyps 06/18/2017   Hyperlipidemia    Hypertension    Hypothyroidism 08/11/2014   Hypothyroidism    Insomnia 06/18/2017   Insomnia    Memory change    Mixed hyperlipidemia 06/18/2017   Morbid obesity ([place]) 04/16/3219   Complicated by HBP/ Low erv on pfts 03/16/2016 (31%)     Morbid obesity due to excess calories (HCC)    Prediabetes    Sleep apnea    SOB (shortness of breath)    Thyroid disease    Tinnitus of both ears 06/18/2017   Tobacco use 06/18/2017  Unsteadiness on feet    Vitamin D deficiency 06/18/2017   Vitamin D deficiency     Family History  Problem Relation Age of Onset   Cancer Mother    Hyperlipidemia Father    Hypertension Father    CAD Father    Stroke Father    Aortic aneurysm Father    Prostate cancer Father    Hyperlipidemia Brother    Appendicitis Maternal Grandfather     Social History   Socioeconomic History   Marital status: Married    Spouse name: Not on file   Number of children: Not on file   Years of education: Not on file   Highest education level: Associate degree: occupational, Hotel manager, or vocational program   Occupational History   Occupation: act.  assist  Tobacco Use   Smoking status: Former    Packs/day: 1.00    Years: 25.00    Total pack years: 25.00    Types: Cigarettes    Quit date: 12/10/2000    Years since quitting: 21.5   Smokeless tobacco: Never   Tobacco comments:    heavy vape user- quit vaping 06/04/2017  Vaping Use   Vaping Use: Former  Substance and Sexual Activity   Alcohol use: No    Alcohol/week: 0.0 standard drinks of alcohol   Drug use: No   Sexual activity: Not on file  Other Topics Concern   Not on file  Social History Narrative   Admitted to [facility] 06/14/17- discharged   Lives at home with his wife   Married - Reginald Tucker   Former smoker - stopped 2002   Alcohol none   Full code   Right handed   Drinks 2 cups of caffeine daily   Social Determinants of Radio broadcast assistant Strain: Not on file  Food Insecurity: Not on file  Transportation Needs: Not on file  Physical Activity: Not on file  Stress: Not on file  Social Connections: Not on file  Intimate Partner Violence: Not on file    Past Medical History, Surgical history, Social history, and Family history were reviewed and updated as appropriate.   Please see review of systems for further details on the patient's review from today.   Objective:   Physical Exam:  There were no vitals taken for this visit.  Physical Exam Neurological:     Mental Status: He is alert and oriented to person, place, and time.     Cranial Nerves: No dysarthria.  Psychiatric:        Attention and Perception: Attention and perception normal.        Mood and Affect: Mood is not anxious or depressed. Affect is not blunt or tearful.        Speech: Speech normal. Speech is not rapid and pressured.        Behavior: Behavior is cooperative.        Thought Content: Thought content normal. Thought content is not paranoid or delusional. Thought content does not include homicidal or suicidal ideation. Thought content  does not include suicidal plan.        Cognition and Memory: Cognition and memory normal.        Judgment: Judgment normal.     Comments: Insight intact Markedly bettter.  Mild occ irritability    Lab Review:     Component Value Date/Time   NA 138 02/21/2022 1054   K 4.8 02/21/2022 1054   CL 104 02/21/2022 1054   CO2 28 02/21/2022 1054  GLUCOSE 118 (H) 02/21/2022 1054   BUN 17 02/21/2022 1054   CREATININE 1.07 02/21/2022 1054   CALCIUM 9.5 02/21/2022 1054   PROT 6.9 02/11/2018 0623   ALBUMIN 4.2 02/11/2018 0623   AST 25 02/11/2018 0623   ALT 33 02/11/2018 0623   ALKPHOS 89 02/11/2018 0623   BILITOT 0.9 02/11/2018 0623   GFRNONAA >60 02/21/2022 1054   GFRAA >60 08/13/2018 1020       Component Value Date/Time   WBC 5.5 02/21/2022 1054   RBC 5.34 02/21/2022 1054   HGB 16.7 02/21/2022 1054   HCT 50.7 02/21/2022 1054   PLT 204 02/21/2022 1054   MCV 94.9 02/21/2022 1054   MCH 31.3 02/21/2022 1054   MCHC 32.9 02/21/2022 1054   RDW 12.4 02/21/2022 1054   LYMPHSABS 1.3 08/13/2018 1020   MONOABS 0.7 08/13/2018 1020   EOSABS 0.1 08/13/2018 1020   BASOSABS 0.1 08/13/2018 1020    No results found for: "POCLITH", "LITHIUM"   No results found for: "PHENYTOIN", "PHENOBARB", "VALPROATE", "CBMZ"   .res Assessment: Plan:    Bipolar 1 disorder, mixed, moderate (HCC)  Seasonal depression ([place])  Generalized anxiety disorder  Attention deficit hyperactivity disorder (ADHD), predominantly inattentive type  Mild cognitive impairment    Severe TRD and anxiety.   Greater than 50% of 30 min non face to face time with patient was spent on counseling and coordination of care. Disc history of TRD.  He is markedly better at this point on quetiapine 300 mg nightly and lamotrigine 200 mg twice daily and risperidone 1 mg twice daily.  He is not needing anxiety medicines or sleep medicines.  He is tolerating the medications well. Also we discussed at length that the increased and his  physical and social activity and spiritual activity is clearly having a marked positive effect on his mood as well.  It is unlikely for medication alone to maintain him with regard to his mood.  He has a history of multiple recurrences and very unstable bipolar disorder.  He agrees to try to keep continue his positive social and physical behaviors.  He is also added gym 5 days a week which is clearly helping.  No change indicated.   Continue Seroquel 300 mg HS Continue risperidone 1 mg twice daily as he is feels it is helping Continue lamotrigine 200 mg twice daily   Discussed potential metabolic side effects associated with atypical antipsychotics, as well as potential risk for movement side effects. Advised pt to contact office if movement side effects occur.   Discussed relapse prevention techniques in view of upcoming hernia surgery.  That we will certainly reduce his ability to stay active for a while.  FU 6  mos DT stability  Lynder Parents, MD, DFAPA    No future appointments.   No orders of the defined types were placed in this encounter.      -------------------------------

## 2022-06-26 NOTE — Addendum Note (Signed)
Addended by: Reatha Armour on: 06/26/2022 01:29 PM   Modules accepted: Level of Service

## 2022-07-13 DIAGNOSIS — K4190 Unilateral femoral hernia, without obstruction or gangrene, not specified as recurrent: Secondary | ICD-10-CM | POA: Diagnosis not present

## 2022-07-13 DIAGNOSIS — K4090 Unilateral inguinal hernia, without obstruction or gangrene, not specified as recurrent: Secondary | ICD-10-CM | POA: Diagnosis not present

## 2022-07-14 ENCOUNTER — Other Ambulatory Visit: Payer: Self-pay | Admitting: Psychiatry

## 2022-07-14 DIAGNOSIS — F314 Bipolar disorder, current episode depressed, severe, without psychotic features: Secondary | ICD-10-CM

## 2022-07-16 ENCOUNTER — Other Ambulatory Visit: Payer: Self-pay | Admitting: Psychiatry

## 2022-07-16 DIAGNOSIS — F314 Bipolar disorder, current episode depressed, severe, without psychotic features: Secondary | ICD-10-CM

## 2022-07-17 ENCOUNTER — Other Ambulatory Visit: Payer: Self-pay | Admitting: Psychiatry

## 2022-07-17 DIAGNOSIS — F3162 Bipolar disorder, current episode mixed, moderate: Secondary | ICD-10-CM

## 2022-08-01 ENCOUNTER — Telehealth: Payer: Self-pay | Admitting: Psychiatry

## 2022-08-01 DIAGNOSIS — F411 Generalized anxiety disorder: Secondary | ICD-10-CM

## 2022-08-01 MED ORDER — LORAZEPAM 0.5 MG PO TABS: ORAL_TABLET | ORAL | 0 refills | Status: DC

## 2022-08-01 NOTE — Telephone Encounter (Signed)
Returned call to patient. Pt reports that his anxiety started about 3-4 weeks ago and is increasing. Rates anxiety a 9/10. He denies having anxiety this extreme since he was 66 yo. He reports that he has been having difficulty sitting still- "I feel like I am on edge." No having to pace. He reports that he is constantly shaking his leg and twisting his ankle. He reports difficulty falling asleep "but once I get to sleep I do ok." Sleeping 12:30 am until 7:30- 8 am. He describes his mood as "impatient, moody, sometimes I am nice, sometimes I am not."   He reports that he has not been wanting to be alone and has not been able to drive.   Denies SI.   Wife has said he is not wanting her to go to work. She said that he has not wanted her to leave home. Wife reports that he has not been able to tolerate benzodiazepines in the past and this caused some forgetfulness.   Propranolol was not effective for anxiety in the past.   Discussed potential benefits, risk, and side effects of benzodiazepines to include potential risk of tolerance and dependence, as well as possible drowsiness.  Advised patient not to drive if experiencing drowsiness and to take lowest possible effective dose to minimize risk of dependence and tolerance. Discussed potential benefits, risks, and side effects of ativan. Pt agrees to trial of Ativan. Will start Ativan 0.5 mg 1/2-1 tab po TID prn anxiety. Patient advised to contact office with any questions, adverse effects, or acute worsening in signs and symptoms.

## 2022-08-01 NOTE — Telephone Encounter (Signed)
Patient lvm stating his anxiety is off the charts. He stated it's at a 9 on a scale of 10. Patient is requesting help as soon as possible.  Contact information # [phone]

## 2022-08-01 NOTE — Telephone Encounter (Signed)
Reginald Tucker received a call and has since sent in medication. See other phone message.

## 2022-08-03 ENCOUNTER — Other Ambulatory Visit: Payer: Self-pay | Admitting: Psychiatry

## 2022-08-03 DIAGNOSIS — F3162 Bipolar disorder, current episode mixed, moderate: Secondary | ICD-10-CM

## 2022-08-09 DIAGNOSIS — D485 Neoplasm of uncertain behavior of skin: Secondary | ICD-10-CM | POA: Diagnosis not present

## 2022-08-09 DIAGNOSIS — L218 Other seborrheic dermatitis: Secondary | ICD-10-CM | POA: Diagnosis not present

## 2022-08-09 DIAGNOSIS — L738 Other specified follicular disorders: Secondary | ICD-10-CM | POA: Diagnosis not present

## 2022-08-09 DIAGNOSIS — C4442 Squamous cell carcinoma of skin of scalp and neck: Secondary | ICD-10-CM | POA: Diagnosis not present

## 2022-08-09 DIAGNOSIS — L821 Other seborrheic keratosis: Secondary | ICD-10-CM | POA: Diagnosis not present

## 2022-08-09 DIAGNOSIS — Z85828 Personal history of other malignant neoplasm of skin: Secondary | ICD-10-CM | POA: Diagnosis not present

## 2022-08-14 ENCOUNTER — Telehealth: Payer: Self-pay | Admitting: Psychiatry

## 2022-08-14 ENCOUNTER — Other Ambulatory Visit: Payer: Self-pay | Admitting: Psychiatry

## 2022-08-14 DIAGNOSIS — F411 Generalized anxiety disorder: Secondary | ICD-10-CM

## 2022-08-14 MED ORDER — LORAZEPAM 0.5 MG PO TABS: 0.5000 mg | ORAL_TABLET | Freq: Three times a day (TID) | ORAL | 0 refills | Status: DC | PRN

## 2022-08-14 NOTE — Telephone Encounter (Signed)
Sent prescription for lorazepam.  Please make sure that he has been compliant with his other psychiatric medications consistently.  Also have him move his appointment up.

## 2022-08-14 NOTE — Telephone Encounter (Signed)
Addendum to attached message. Reginald Tucker just called and said that he needs to have his Lorazepam filled and he doesn't have any left. Since he got a 10 day supply of Ativan on 8/23 could he got 20 more refilled? He said he really needs it. Pharmacy is:  [facility] [place], [place] - [address]  Phone:  [phone]  Fax:  [phone]    His number is [phone].

## 2022-08-14 NOTE — Telephone Encounter (Signed)
Please also advise if it should be a 30 day supply

## 2022-08-14 NOTE — Telephone Encounter (Signed)
Pt informed

## 2022-08-14 NOTE — Telephone Encounter (Signed)
Pt called and left message at 9:54a.  He would like Lorazepam 0.'5mg'$  sent to  [facility], [address]  [place], [place] [address]  Phone:  [phone]  Fax:  [phone]   Next appt 1/18

## 2022-08-14 NOTE — Telephone Encounter (Signed)
Jessica sent 10 day supply of ativan on 8/23.Ok to pend?

## 2022-08-20 NOTE — Telephone Encounter (Signed)
Pls check all psych meds and record if he's compliant before sending the message.  Is he also still taking the lamotrigine 200 mg BID?  If so , then increase the Seroquel to 1 and 1/2 of the 300 mg tablets for depression and anxiety.  If not let me know.

## 2022-08-20 NOTE — Telephone Encounter (Signed)
Patient lvm at 11:46 stating that last week he was prescribed Lorazepam for anxiety " and seems to be working fine. Now however he is experiencing a little depression. He is currently taking Quetipine '300mg'$  before bed and Risperdone '1mg'$  2x a day. He is feeling a little uncomfortable and would like to know what he should do. Ph: [phone]

## 2022-08-20 NOTE — Telephone Encounter (Signed)
He is I'm sorry I did not put that in the message but I did indeed ask him about the Lamictal.He will increase Seroquel and call us back if he has any new side effects

## 2022-08-20 NOTE — Telephone Encounter (Signed)
Pt stated he does not have motivation and his brain feels like "he is in a bind". He said he is just uncomfortable and now the ativan is not working as well as it was either and he thinks it's due to the depression.

## 2022-08-23 DIAGNOSIS — Z85828 Personal history of other malignant neoplasm of skin: Secondary | ICD-10-CM | POA: Diagnosis not present

## 2022-08-23 DIAGNOSIS — C4442 Squamous cell carcinoma of skin of scalp and neck: Secondary | ICD-10-CM | POA: Diagnosis not present

## 2022-08-30 DIAGNOSIS — E559 Vitamin D deficiency, unspecified: Secondary | ICD-10-CM | POA: Diagnosis not present

## 2022-08-30 DIAGNOSIS — E039 Hypothyroidism, unspecified: Secondary | ICD-10-CM | POA: Diagnosis not present

## 2022-08-30 DIAGNOSIS — F319 Bipolar disorder, unspecified: Secondary | ICD-10-CM | POA: Diagnosis not present

## 2022-08-30 DIAGNOSIS — Z0000 Encounter for general adult medical examination without abnormal findings: Secondary | ICD-10-CM | POA: Diagnosis not present

## 2022-08-30 DIAGNOSIS — J449 Chronic obstructive pulmonary disease, unspecified: Secondary | ICD-10-CM | POA: Diagnosis not present

## 2022-08-30 DIAGNOSIS — I10 Essential (primary) hypertension: Secondary | ICD-10-CM | POA: Diagnosis not present

## 2022-08-30 DIAGNOSIS — E782 Mixed hyperlipidemia: Secondary | ICD-10-CM | POA: Diagnosis not present

## 2022-08-30 DIAGNOSIS — R7303 Prediabetes: Secondary | ICD-10-CM | POA: Diagnosis not present

## 2022-09-10 ENCOUNTER — Other Ambulatory Visit: Payer: Self-pay | Admitting: Psychiatry

## 2022-09-10 DIAGNOSIS — F411 Generalized anxiety disorder: Secondary | ICD-10-CM

## 2022-09-13 ENCOUNTER — Other Ambulatory Visit: Payer: Self-pay | Admitting: Psychiatry

## 2022-09-13 DIAGNOSIS — F3162 Bipolar disorder, current episode mixed, moderate: Secondary | ICD-10-CM

## 2022-10-01 ENCOUNTER — Telehealth: Payer: Self-pay | Admitting: Psychiatry

## 2022-10-01 DIAGNOSIS — F314 Bipolar disorder, current episode depressed, severe, without psychotic features: Secondary | ICD-10-CM

## 2022-10-01 MED ORDER — QUETIAPINE FUMARATE 300 MG PO TABS: 450.0000 mg | ORAL_TABLET | Freq: Every day | ORAL | 0 refills | Status: DC

## 2022-10-01 NOTE — Telephone Encounter (Signed)
It was increased to 1.5 tablets of 300 mg for a total of 450 mg. Patient said this is working well for him but he has run out.

## 2022-10-01 NOTE — Telephone Encounter (Signed)
Rx sent 

## 2022-10-01 NOTE — Telephone Encounter (Signed)
Pt requesting new Rx Quetiapine 300 mg. Last visit CC increased to 300 mg. Ran out too soon. Apt 1/18 Pt # [phone]

## 2022-10-01 NOTE — Telephone Encounter (Signed)
Per 9/5 note patient is to increase quetiapine 300 mg to 1-1/2 tablets. He is taking as prescribed.

## 2022-10-01 NOTE — Telephone Encounter (Signed)
Patient has been upped to 450 mg of quetiapine - 1-1/2 300 mg tablets. He said his insurance will not cover. CMM has timed out. I don't know if a PA request has been sent.

## 2022-10-01 NOTE — Telephone Encounter (Signed)
Pt LVM @ 4:12p.  Sounded very frustrated. Said his insurance will not cover the med and he guess he was going to just have to keep taking the 400 mgs.  Pls call him back.  Next appt 1/18

## 2022-10-03 NOTE — Telephone Encounter (Signed)
FYI, PA unable to be processed through Cover my meds since its only a quantity issue. [place] [employer] at [phone] and spoke with a respresenative whom initiated another PA for the quantity request. They needed additional information as to why pt is taking both Quetiapine 300 mg 1.5 tabs daily plus Rispderidone 1 mg bid since they are in the same class. Explained pt has severe bipolar and can become unstable easily. Informed them he has been on higher doses of Quetiapine up to 800 mg daily in the past as well and now at a lower dose. He also does not need additional medication for anxiety or sleep. They request office notes be sent as well to review at, [phone]. PA # [id number]. Once received the determination can take up to 72 hours and will be received via fax.  If needing to call back to check status use [phone] or can also log onto [email or web address]   Will fax over office notes.

## 2022-10-03 NOTE — Telephone Encounter (Signed)
No PA received but I initiated one.

## 2022-10-03 NOTE — Telephone Encounter (Signed)
Called patient to let him know this information and he said that the pharmacy told him Rx was ready to pick up.

## 2022-10-04 ENCOUNTER — Other Ambulatory Visit: Payer: Self-pay | Admitting: Psychiatry

## 2022-10-04 DIAGNOSIS — F314 Bipolar disorder, current episode depressed, severe, without psychotic features: Secondary | ICD-10-CM

## 2022-10-04 NOTE — Telephone Encounter (Signed)
Prior Approval received for QUETIAPINE FUMARATE 300 MG TABS effective through 12/10/2023.

## 2022-10-12 ENCOUNTER — Other Ambulatory Visit: Payer: Self-pay | Admitting: Psychiatry

## 2022-10-12 DIAGNOSIS — F3162 Bipolar disorder, current episode mixed, moderate: Secondary | ICD-10-CM

## 2022-10-17 DIAGNOSIS — L0889 Other specified local infections of the skin and subcutaneous tissue: Secondary | ICD-10-CM | POA: Diagnosis not present

## 2022-11-09 ENCOUNTER — Other Ambulatory Visit: Payer: Self-pay | Admitting: Psychiatry

## 2022-11-09 DIAGNOSIS — F3162 Bipolar disorder, current episode mixed, moderate: Secondary | ICD-10-CM

## 2022-12-27 ENCOUNTER — Encounter: Payer: Self-pay | Admitting: Psychiatry

## 2022-12-27 ENCOUNTER — Other Ambulatory Visit: Payer: Self-pay | Admitting: Psychiatry

## 2022-12-27 ENCOUNTER — Telehealth: Payer: Self-pay | Admitting: Psychiatry

## 2022-12-27 ENCOUNTER — Ambulatory Visit (INDEPENDENT_AMBULATORY_CARE_PROVIDER_SITE_OTHER): Payer: Medicare HMO | Admitting: Psychiatry

## 2022-12-27 DIAGNOSIS — G3184 Mild cognitive impairment, so stated: Secondary | ICD-10-CM

## 2022-12-27 DIAGNOSIS — R2689 Other abnormalities of gait and mobility: Secondary | ICD-10-CM | POA: Diagnosis not present

## 2022-12-27 DIAGNOSIS — F900 Attention-deficit hyperactivity disorder, predominantly inattentive type: Secondary | ICD-10-CM | POA: Diagnosis not present

## 2022-12-27 DIAGNOSIS — F314 Bipolar disorder, current episode depressed, severe, without psychotic features: Secondary | ICD-10-CM | POA: Diagnosis not present

## 2022-12-27 DIAGNOSIS — F411 Generalized anxiety disorder: Secondary | ICD-10-CM | POA: Diagnosis not present

## 2022-12-27 DIAGNOSIS — F338 Other recurrent depressive disorders: Secondary | ICD-10-CM

## 2022-12-27 MED ORDER — LORAZEPAM 0.5 MG PO TABS: 0.5000 mg | ORAL_TABLET | Freq: Four times a day (QID) | ORAL | 1 refills | Status: DC | PRN

## 2022-12-27 NOTE — Telephone Encounter (Signed)
Verify if patient is taking 1 or 1.5 tablets

## 2022-12-27 NOTE — Telephone Encounter (Signed)
Please advise 

## 2022-12-27 NOTE — Progress Notes (Addendum)
------------------------------- [id number] 1956-07-27 67 y.o.  Virtual Visit via Telephone Note  I connected with pt by telephone and verified that I am speaking with the correct person using two identifiers.   I discussed the limitations, risks, security and privacy concerns of performing an evaluation and management service by telephone and the availability of in person appointments. I also discussed with the patient that there may be a patient responsible charge related to this service. The patient expressed understanding and agreed to proceed.  I discussed the assessment and treatment plan with the patient. The patient was provided an opportunity to ask questions and all were answered. The patient agreed with the plan and demonstrated an understanding of the instructions.   The patient was advised to call back or seek an in-person evaluation if the symptoms worsen or if the condition fails to improve as anticipated.  I provided 30 minutes of non-face-to-face time during this encounter. The call started at 215 and ended at 36. The patient was located at home and the provider was located office.   Subjective:   Patient ID:  Reginald Tucker is a 67 y.o. (DOB 03/20/1956) male.  Chief Complaint:  Chief Complaint  Patient presents with   Follow-up    Bipolar 1 disorder, mixed, moderate ([place])   Depression   Anxiety    Anxiety Symptoms include nervous/anxious behavior and shortness of breath. Patient reports no chest pain, confusion, decreased concentration or suicidal ideas.    Depression        Associated symptoms include no decreased concentration, no fatigue and no suicidal ideas.  Past medical history includes anxiety.    Reginald Tucker presents to the office today for follow-up of TRD and anxiety.  When seen April 06, 2019.  He had not seen any mood benefit from low-dose pramipexole and had side effects at higher dosages.  Therefore we weaned him off of the that  medication.  visit August 07, 2019 and the following changes were made: Option retry Vraylar with selegiline which wasn't adequately done before.  Prior trial inadequate duration and SE Retry Vraylar at a lower dosage to prevent tremors: 1 capsule every Monday, Wednesday, Friday only. Made him jittery and stopped after a week.  Didn't see mood benefit.  October 2020 was last appointment and there were no med changes as the patient was going to seek a second opinion.  The following was noted: Still irritable and real tired.  Easily fatigued with normal activity.  Appt with PCP Oct 27 to evaluate. Not much tremor. Waves of depression worse in the morning.  Sleeping too much in recliner and lethargic.  Deep sleep.  Takes 60 min to fall asleep.  Cant' shut off his brain and hard to fall asleep.  Reads a lot and enjoys it especially history.  Pt reports that mood is Anxious, Depressed and Irritable rated 7/10 bad and describes anxiety as Moderate and occassional.  Less anxiety this visit than depression..  Staying in the house is making it worse too.   Anxiety symptoms include: Excessive Worry, Panic Symptoms, Social Anxiety,. Has claustrophobia.  Hard to ride elevators .  Can't go up the steps here.  Pt reports no sleep issues. Pt reports that appetite is good. Pt reports that energy is poor and anhedonia, loss of interest or pleasure in usual activities, poor motivation and withdrawn from usual activities. Concentration is down slightly. Suicidal thoughts:  denied by patient.  Still has days that does very little.  Will clean  occ.   10/17/2020 appointment with the following noted:  Wife joined Patient has requested refills from this location but has not been seen here in a year which is not appropriate given his level of instability historically. Didn't go for second opinion since here.   Still doing about the same without much change.  Some days are better than others. Wife says he does some things  and will go out sometimes.  Doesn't want to go out at dark.  Thinks winter is the worst time. Asked about [place]. Tolerating meds OK.   Chronically depressed and easily irritable, without pattern except brief. Wife says some weeks are worse than others. No walking or exercise.  Balance issues.  Was better when did PT but he quit the exercises. Patient is highly claustrophobic and states is getting worse.  He is having trouble using the elevator to get to our office.  He asked about finding another psychiatrist who has an office on the first floor and we discussed options.  Normal card review recently.  02/21/21 appt noted: W involved in call. Very sick with covid pneumonia and nearly died in 10-Jan-2023 and still recovering. Doing breathing exercises and some walking.  Weak in am. A lot of depression in hsopital thinking he would die.  Now dep 7/10.   No change in mood off selegiline per pt or Reginald Tucker but she's not sure. Concerta didn't help and they agree.  He stopped it and went back on selegiline. Memory is bad. Taking melatonin 10 comes and goes with benefit.  Takes 1 and 1/2 hours to go to sleep. To bed 1110 and up at 9-930 Plan: DC selegiline   Wait 5 days to clear and start MPH ER 36 mg each AM.   If NR after 2 weeks, then ER increase if they call.  03/29/2021 phone call from wife reporting that [place] was working and they wanted more samples while we worked on a [place]  04/24/2021 appointment with the following noted:  Wife on session also Took MPH and couldn't tell a difference except wife said he was more anxious.  Stopped it. Caplyta 42 mg daily and now says he's worse.  Not doing well at all.  Wonders if he's worse bc of the reduction in Seroquel.  Going through hell for 3-4 weeks with 8/10 depression.  No SI. Getting up earlier. More anxiety and hard to lay down. Came to a head with wife saying she's leaving for 24 days to help her daughter with a child.  She was gone 10 days in [facility]   Inconsistency in life.  Better if going to church but usually doesn't do it. Got saved my life from [place] DT failure. Return to Seroquel 400 bc worse without it. He wants to use risperidone 1 mg BID prn anxiety.  06/21/2021 appointment with the following noted:  Wife on call Only increased Seroquel to 300 mg HS. Doing great and about 3/10 depression.  Started improving pretty quickly.  Went back to church and out with friends.  W gone 3 weeks.  Going to Bible study.  Got together with friends he hasn't seen  in 4 years. I feel good. No SE. Sleep good usually.  Not sleeping excessively as much as in the past about 8-9 hours.  Anxiety is etter not gone.   Trying to walk without a cane.  Recent PCP was unremarkable.  Lost to 218#.    09/26/21 appt noted: wife also on call Great.  Gym 5-6 days per week. Lost 25#.  Getting out more.  Involved in church.  Feels better than last time and more active and socializing.  Mostly out of depression 1-2/10. No SE.  Better with queiapine 300 vs 400 re: sedation. Wife agrees he's dramatically better all around. Needs and gets 8-9 hours nightly and 2 brief naps daily. Anxiety is manageable and risperidone helps.  Risperidone also helps keep down the level of irritability which is occasional and manageable. Plan: Continue Seroquel 300 mg HS Continue risperidone 1 mg twice daily as he is feels it is helping Continue lamotrigine 200 mg twice daily   01/23/22 appt noted: Still doing fine with depression with some seasonal blues.  Involved in couple of Bible studies.  Walking and reading books.  Getting out and doing things.  Men's and couples conferences. [employer]. Does chores.  Good health. Depression 2/10.  Sleep pretty good with occ initial insomnia.  Restorative.   No SE problems Plan: No change indicated.   Continue Seroquel 300 mg HS Continue risperidone 1 mg twice daily as he is feels it is helping Continue  lamotrigine 200 mg twice daily  06/26/22 appt noted: I con't have any depression and anxiety No complaints.  Active at church.   Slowed DT hernia.  Surg 8/4 bc bothers him.  Curtailed physcial activity. No SE. Hard to shut off brain at night.  Reads bible.  Takes awhile to go to sleep.  Chronic problem.  Sleep from 1-8 but would like to sleep more. Plan: No med changes  12/27/2022 appointment noted: CC anxiety more than SE concerns On lamotrigine and Seroquel and lorazepam Ran out of risperidone for a a couple of months had trouble with refill. Wife looked up SE meds. Asks about SE each.  Disc balance issues.  Wife concerned about his memory and balance. Not sedated.  No falls lately.  Is having some memory issues.  STM issues. Everything is ok with quetiapine. Sometimes problems with balance.  Long term issue.  Can sometimes shuffle feet.   Mood for the most part steady with occ flare ups . Anxiety not well controlled.  Taking more lorazepam lately.  Lasts about 8 hours. About 2 daily.  Anxiety picked up 5-6 months ago.  No pressure on him.  No reason for the anxiety.   Sleep study negative for OSA noted on chart.  Past Psychiatric Medication Trials: He has had multiple psych med failures as well .   Past psychiatric medications used include pramipexole NR, sertraline,  Trintellix 1.5 mg MWF jittery,  selegiline max '30mg'$  daily, Paxil, Wellbutrin, buspirone,   lamotrigine, carbamazepine, lithium with a tremor,  Depakote was side effects of feeling heavy and increased ammonia,    Seroquel 800 mg a day, Latuda 120 mg a day,  Rexulti,  Perphenazine,  Vraylar '3mg'$  akathisia, He did not tolerate Vraylar even at 1.5 mg 3 days a week. risperidone, olanzapine, quetiapine Caplyta 42 NR  modafinil,  Adderall with selegiline with BP problems, Concerta 54 NR Light therapy failure. ECT twice, failed 2nd time. Affectively better  after adding pramipexole to 0.5 mg twice daily and increasing  Seroquel to 600 mg daily but lost response apparently soon thereafter.  Review of Systems:  Review of Systems  Constitutional:  Negative for fatigue.  Respiratory:  Positive for shortness of breath.   Cardiovascular:  Negative for chest pain.  Gastrointestinal:  Positive for abdominal pain.  Musculoskeletal:  Positive for back pain.  Neurological:  Negative for tremors and weakness.  Psychiatric/Behavioral:  Positive for depression. Negative for agitation, behavioral problems, confusion, decreased concentration, dysphoric mood, hallucinations, self-injury, sleep disturbance and suicidal ideas. The patient is nervous/anxious. The patient is not hyperactive.   Occurs 2-3 times/weekre: leg weakness.  Not orthostatic.  Medications: I have reviewed the patient's current medications.  Current Outpatient Medications  Medication Sig Dispense Refill   aspirin EC 81 MG tablet Take 81 mg by mouth daily.     Cholecalciferol (VITAMIN D3) 25 MCG (1000 UT) CAPS Take 1,000 Units by mouth daily.     L-THEANINE PO Take by mouth.     levothyroxine (SYNTHROID) 175 MCG tablet Take 175 mcg by mouth daily before breakfast.     lovastatin (MEVACOR) 40 MG tablet Take 40 mg by mouth daily.      MAGNESIUM PO Take 3 tablets by mouth daily. 3750 mg total     Omega-3 Fatty Acids (SUPER OMEGA 3 PO) Take 1 capsule by mouth daily.     omeprazole (PRILOSEC) 20 MG capsule Take 20 mg by mouth daily.     traMADol (ULTRAM) 50 MG tablet Take 1-2 tablets (50-100 mg total) by mouth every 6 (six) hours as needed for moderate pain or severe pain. 20 tablet 0   lamoTRIgine (LAMICTAL) 100 MG tablet TAKE 1 TABLETS(200 MG) BY MOUTH IN THE MORNING AND 2 TABLETS AT NIGHT. 360 tablet 0   LORazepam (ATIVAN) 0.5 MG tablet Take 1 tablet (0.5 mg total) by mouth every 6 (six) hours as needed for anxiety. 90 tablet 1   QUEtiapine (SEROQUEL) 300 MG tablet TAKE 1 AND 1/2 TABLETS(450 MG) BY MOUTH DAILY 135 tablet 0   risperiDONE (RISPERDAL) 1 MG  tablet TAKE 1 TABLET(1 MG) BY MOUTH TWICE DAILY (Patient not taking: Reported on 12/31/2022) 180 tablet 0   vitamin B-12 (CYANOCOBALAMIN) 50 MCG tablet Take 50 mcg by mouth daily.     zinc gluconate 50 MG tablet Take 50 mg by mouth daily.     No current facility-administered medications for this visit.    Medication Side Effects:got nervous and irritable with 1.5 mg pramipexole in morning, ok with it split up and no change in mood.  Allergies:  Allergies  Allergen Reactions   Lithium     Makes pt jittery/causes anxiety    Ambien [Zolpidem Tartrate] Anxiety    Nervous, uncontrollable   Sulfa Antibiotics Other (See Comments)    UNSPECIFIED REACTION OF CHILDHOOD   Sulfamethoxazole Other (See Comments)    UNSPECIFIED REACTION OF CHILDHOOD    Past Medical History:  Diagnosis Date   Acquired hallux rigidus of right foot 04/26/2017   AKI (acute kidney injury) ([place]) 06/11/2017   Anxiety    Bipolar 1 disorder (HCC)    BMI 37.0-37.9, adult    BPH (benign prostatic hyperplasia)    Cataract    Cataract    L eye   CKD (chronic kidney disease), stage III (HCC)    Colon polyps    COPD GOLD II with restrictive component  01/13/2016   Spirometry 01/13/2016  FEV1 1.84 (47%)  Ratio 62  - 01/13/2016  extensive coaching HFA effectiveness =    90% > try stiolto respimat 2 pffs each am > did not benefit so stopped when sample out - 01/13/2016  Walked RA x 3 laps @ 185 ft each stopped due to  End of study, nl pace, no desat  / min sob  - PFT's  03/16/2016  FEV1 2.28 (59 % ) ratio  67  p 12 % improvement from saba p no prior to study with DLCO  66 % corrects to 86 % for alv volume      Depression    Dysrhythmia    Essential hypertension 01/19/2015   Family history of coronary arteriosclerosis 01/19/2015   Father with MI    Fatty liver    GERD (gastroesophageal reflux disease) 08/11/2014   History of colon polyps 06/18/2017   Hyperlipidemia    Hypertension    Hypothyroidism 08/11/2014   Hypothyroidism    Insomnia  06/18/2017   Insomnia    Memory change    Mixed hyperlipidemia 06/18/2017   Morbid obesity ([place]) 01/15/2034   Complicated by HBP/ Low erv on pfts 03/16/2016 (31%)     Morbid obesity due to excess calories (HCC)    Prediabetes    Sleep apnea    SOB (shortness of breath)    Thyroid disease    Tinnitus of both ears 06/18/2017   Tobacco use 06/18/2017   Unsteadiness on feet    Vitamin D deficiency 06/18/2017   Vitamin D deficiency     Family History  Problem Relation Age of Onset   Cancer Mother    Hyperlipidemia Father    Hypertension Father    CAD Father    Stroke Father    Aortic aneurysm Father    Prostate cancer Father    Hyperlipidemia Brother    Appendicitis Maternal Grandfather     Social History   Socioeconomic History   Marital status: Married    Spouse name: Not on file   Number of children: Not on file   Years of education: Not on file   Highest education level: Associate degree: occupational, Hotel manager, or vocational program  Occupational History   Occupation: act.  assist  Tobacco Use   Smoking status: Former    Packs/day: 1.00    Years: 25.00    Total pack years: 25.00    Types: Cigarettes    Quit date: 12/10/2000    Years since quitting: 22.0   Smokeless tobacco: Never   Tobacco comments:    heavy vape user- quit vaping 06/04/2017  Vaping Use   Vaping Use: Former  Substance and Sexual Activity   Alcohol use: No    Alcohol/week: 0.0 standard drinks of alcohol   Drug use: No   Sexual activity: Not on file  Other Topics Concern   Not on file  Social History Narrative   Admitted to [facility] 06/14/17- discharged   Lives at home with his wife   Married - Reginald Tucker   Former smoker - stopped 2002   Alcohol none   Full code   Right handed   Drinks 2 cups of caffeine daily   Social Determinants of Radio broadcast assistant Strain: Not on file  Food Insecurity: Not on file  Transportation Needs: Not on file  Physical Activity: Not on file  Stress: Not  on file  Social Connections: Not on file  Intimate Partner Violence: Not on file    Past Medical History, Surgical history, Social history, and Family history were reviewed and updated as appropriate.   Please see review of systems for further details on the patient's review from today.   Objective:   Physical Exam:  There were no vitals taken for this visit.  Physical Exam Neurological:     Mental Status: He is alert and oriented to person, place, and time.     Cranial Nerves: No dysarthria.  Psychiatric:  Attention and Perception: Attention and perception normal.        Mood and Affect: Mood is anxious. Mood is not depressed. Affect is not blunt or tearful.        Speech: Speech normal. Speech is not rapid and pressured.        Behavior: Behavior is cooperative.        Thought Content: Thought content normal. Thought content is not paranoid or delusional. Thought content does not include homicidal or suicidal ideation. Thought content does not include suicidal plan.        Cognition and Memory: Cognition and memory normal.        Judgment: Judgment normal.     Comments: Insight intact Can calculate dosages.    Lab Review:     Component Value Date/Time   NA 138 02/21/2022 1054   K 4.8 02/21/2022 1054   CL 104 02/21/2022 1054   CO2 28 02/21/2022 1054   GLUCOSE 118 (H) 02/21/2022 1054   BUN 17 02/21/2022 1054   CREATININE 1.07 02/21/2022 1054   CALCIUM 9.5 02/21/2022 1054   PROT 6.9 02/11/2018 0623   ALBUMIN 4.2 02/11/2018 0623   AST 25 02/11/2018 0623   ALT 33 02/11/2018 0623   ALKPHOS 89 02/11/2018 0623   BILITOT 0.9 02/11/2018 0623   GFRNONAA >60 02/21/2022 1054   GFRAA >60 08/13/2018 1020       Component Value Date/Time   WBC 5.5 02/21/2022 1054   RBC 5.34 02/21/2022 1054   HGB 16.7 02/21/2022 1054   HCT 50.7 02/21/2022 1054   PLT 204 02/21/2022 1054   MCV 94.9 02/21/2022 1054   MCH 31.3 02/21/2022 1054   MCHC 32.9 02/21/2022 1054   RDW 12.4  02/21/2022 1054   LYMPHSABS 1.3 08/13/2018 1020   MONOABS 0.7 08/13/2018 1020   EOSABS 0.1 08/13/2018 1020   BASOSABS 0.1 08/13/2018 1020    No results found for: "POCLITH", "LITHIUM"   No results found for: "PHENYTOIN", "PHENOBARB", "VALPROATE", "CBMZ"   .res Assessment: Plan:    Severe bipolar I disorder with depression ([place]) - Plan: TSH, B12 and Folate Panel, Lamotrigine level  Generalized anxiety disorder - Plan: LORazepam (ATIVAN) 0.5 MG tablet  Mild cognitive impairment - Plan: TSH, B12 and Folate Panel  Attention deficit hyperactivity disorder (ADHD), predominantly inattentive type  Balance problem - Plan: B12 and Folate Panel, Lamotrigine level    Severe TRD and anxiety.   Greater than 50% of 30 min non face to face time with patient was spent on counseling and coordination of care. Disc history of TRD.  He is markedly better at this point on quetiapine 300 mg nightly and lamotrigine 200 mg twice daily and risperidone 1 mg twice daily.  He is not needing anxiety medicines or sleep medicines.  He is tolerating the medications well. Also we discussed at length that the increased and his physical and social activity and spiritual activity is clearly having a marked positive effect on his mood as well.  It is unlikely for medication alone to maintain him with regard to his mood.  He has a history of multiple recurrences and very unstable bipolar disorder.  He agrees to try to keep continue his positive social and physical behaviors.  He is also added gym 5 days a week which is clearly helping.  Consider clonidine for anxiety and stability. Option clozapine.  Dr. Kenton Kingfisher treating hypothyroidism  No change indicated.   Continue Seroquel 300 mg HS Continue risperidone 1 mg twice daily  as he is feels it is helping Continue lamotrigine 200 mg twice daily Lorazepam 0.5 mg TIDprn is safest option  Check B12, folate, serum lamotrigine level  Come to office to evaluate balance  issue and for EPS   Discussed potential metabolic side effects associated with atypical antipsychotics, as well as potential risk for movement side effects. Advised pt to contact office if movement side effects occur.   Discussed relapse prevention techniques in view of upcoming hernia surgery.  That we will certainly reduce his ability to stay active for a while.  FU ASAP Next appt in office.  Lynder Parents, MD, DFAPA    Future Appointments  Date Time Provider [place]  03/04/2023 11:30 AM Cottle, Billey Co., MD [facility]-CP None     Orders Placed This Encounter  Procedures   TSH   B12 and Folate Panel   Lamotrigine level       -------------------------------

## 2022-12-27 NOTE — Telephone Encounter (Signed)
No fasting required

## 2022-12-27 NOTE — Telephone Encounter (Signed)
Patient lvm at 3:15 asking if he needs to fast before getting blood drawn tommorow. He also inquired if CC would get these results before appt 1/22. Please rtc to discuss [phone]

## 2022-12-28 DIAGNOSIS — G3184 Mild cognitive impairment, so stated: Secondary | ICD-10-CM | POA: Diagnosis not present

## 2022-12-28 DIAGNOSIS — F314 Bipolar disorder, current episode depressed, severe, without psychotic features: Secondary | ICD-10-CM | POA: Diagnosis not present

## 2022-12-28 DIAGNOSIS — R2689 Other abnormalities of gait and mobility: Secondary | ICD-10-CM | POA: Diagnosis not present

## 2022-12-28 NOTE — Telephone Encounter (Signed)
Patient said he was taking 1-1/2

## 2022-12-28 NOTE — Telephone Encounter (Signed)
Pt informed

## 2022-12-30 LAB — LAMOTRIGINE LEVEL: Lamotrigine Lvl: 10.8 ug/mL (ref 2.5–15.0)

## 2022-12-30 LAB — TSH: TSH: 0.01 mIU/L — ABNORMAL LOW (ref 0.40–4.50)

## 2022-12-30 LAB — B12 AND FOLATE PANEL
Folate: 13.2 ng/mL
Vitamin B-12: 475 pg/mL (ref 200–1100)

## 2022-12-31 ENCOUNTER — Ambulatory Visit (INDEPENDENT_AMBULATORY_CARE_PROVIDER_SITE_OTHER): Payer: Medicare HMO | Admitting: Psychiatry

## 2022-12-31 ENCOUNTER — Encounter: Payer: Self-pay | Admitting: Psychiatry

## 2022-12-31 DIAGNOSIS — R2689 Other abnormalities of gait and mobility: Secondary | ICD-10-CM

## 2022-12-31 DIAGNOSIS — F411 Generalized anxiety disorder: Secondary | ICD-10-CM

## 2022-12-31 DIAGNOSIS — G3184 Mild cognitive impairment, so stated: Secondary | ICD-10-CM

## 2022-12-31 DIAGNOSIS — F314 Bipolar disorder, current episode depressed, severe, without psychotic features: Secondary | ICD-10-CM

## 2022-12-31 DIAGNOSIS — F338 Other recurrent depressive disorders: Secondary | ICD-10-CM

## 2022-12-31 NOTE — Patient Instructions (Signed)
Reduce lamotrigine to 1 in the AM and 2 tablets at night

## 2022-12-31 NOTE — Progress Notes (Unsigned)
------------------------------- [id number] 14-May-1956 67 y.o.   Subjective:   Patient ID:  Reginald Tucker is a 67 y.o. (DOB 12-Oct-1956) male.  Chief Complaint:  Chief Complaint  Patient presents with   Follow-up    Severe bipolar I disorder with depression ([place])   Depression   Anxiety   Medication Reaction    Anxiety Symptoms include nervous/anxious behavior and shortness of breath. Patient reports no chest pain, confusion, decreased concentration or suicidal ideas.    Depression        Associated symptoms include no decreased concentration, no fatigue and no suicidal ideas.  Past medical history includes anxiety.    Reginald Tucker presents to the office today for follow-up of TRD and anxiety.  When seen April 06, 2019.  He had not seen any mood benefit from low-dose pramipexole and had side effects at higher dosages.  Therefore we weaned him off of the that medication.  visit August 07, 2019 and the following changes were made: Option retry Vraylar with selegiline which wasn't adequately done before.  Prior trial inadequate duration and SE Retry Vraylar at a lower dosage to prevent tremors: 1 capsule every Monday, Wednesday, Friday only. Made him jittery and stopped after a week.  Didn't see mood benefit.  October 2020 was last appointment and there were no med changes as the patient was going to seek a second opinion.  The following was noted: Still irritable and real tired.  Easily fatigued with normal activity.  Appt with PCP Oct 27 to evaluate. Not much tremor. Waves of depression worse in the morning.  Sleeping too much in recliner and lethargic.  Deep sleep.  Takes 60 min to fall asleep.  Cant' shut off his brain and hard to fall asleep.  Reads a lot and enjoys it especially history.  Pt reports that mood is Anxious, Depressed and Irritable rated 7/10 bad and describes anxiety as Moderate and occassional.  Less anxiety this visit than depression..  Staying in the house  is making it worse too.   Anxiety symptoms include: Excessive Worry, Panic Symptoms, Social Anxiety,. Has claustrophobia.  Hard to ride elevators .  Can't go up the steps here.  Pt reports no sleep issues. Pt reports that appetite is good. Pt reports that energy is poor and anhedonia, loss of interest or pleasure in usual activities, poor motivation and withdrawn from usual activities. Concentration is down slightly. Suicidal thoughts:  denied by patient.  Still has days that does very little.  Will clean occ.   10/17/2020 appointment with the following noted:  Wife joined Patient has requested refills from this location but has not been seen here in a year which is not appropriate given his level of instability historically. Didn't go for second opinion since here.   Still doing about the same without much change.  Some days are better than others. Wife says he does some things and will go out sometimes.  Doesn't want to go out at dark.  Thinks winter is the worst time. Asked about [place]. Tolerating meds OK.   Chronically depressed and easily irritable, without pattern except brief. Wife says some weeks are worse than others. No walking or exercise.  Balance issues.  Was better when did PT but he quit the exercises. Patient is highly claustrophobic and states is getting worse.  He is having trouble using the elevator to get to our office.  He asked about finding another psychiatrist who has an office on the first floor and we  discussed options.  Normal card review recently.  02/21/21 appt noted: W involved in call. Very sick with covid pneumonia and nearly died in 2023/01/06 and still recovering. Doing breathing exercises and some walking.  Weak in am. A lot of depression in hsopital thinking he would die.  Now dep 7/10.   No change in mood off selegiline per pt or [name] but she's not sure. Concerta didn't help and they agree.  He stopped it and went back on selegiline. Memory is bad. Taking  melatonin 10 comes and goes with benefit.  Takes 1 and 1/2 hours to go to sleep. To bed 1110 and up at 9-930 Plan: DC selegiline   Wait 5 days to clear and start [facility] ER 36 mg each AM.   If NR after 2 weeks, then ER increase if they call.  03/29/2021 phone call from wife reporting that [place] was working and they wanted more samples while we worked on a [place]  04/24/2021 appointment with the following noted:  Wife on session also Took [facility] and couldn't tell a difference except wife said he was more anxious.  Stopped it. Caplyta 42 mg daily and now says he's worse.  Not doing well at all.  Wonders if he's worse bc of the reduction in Seroquel.  Going through hell for 3-4 weeks with 8/10 depression.  No SI. Getting up earlier. More anxiety and hard to lay down. Came to a head with wife saying she's leaving for 24 days to help her daughter with a child.  She was gone 10 days in [facility].  Inconsistency in life.  Better if going to church but usually doesn't do it. Got saved my life from [place] DT failure. Return to Seroquel 400 bc worse without it. He wants to use risperidone 1 mg BID prn anxiety.  06/21/2021 appointment with the following noted:  Wife on call Only increased Seroquel to 300 mg HS. Doing great and about 3/10 depression.  Started improving pretty quickly.  Went back to church and out with friends.  W gone 3 weeks.  Going to Bible study.  Got together with friends he hasn't seen  in 4 years. I feel good. No SE. Sleep good usually.  Not sleeping excessively as much as in the past about 8-9 hours.  Anxiety is etter not gone.   Trying to walk without a cane.  Recent PCP was unremarkable.  Lost to 218#.    09/26/21 appt noted: wife also on call Great.  Gym 5-6 days per week. Lost 25#.  Getting out more.  Involved in church.  Feels better than last time and more active and socializing.  Mostly out of depression 1-2/10. No SE.  Better with queiapine 300 vs 400 re: sedation. Wife  agrees he's dramatically better all around. Needs and gets 8-9 hours nightly and 2 brief naps daily. Anxiety is manageable and risperidone helps.  Risperidone also helps keep down the level of irritability which is occasional and manageable. Plan: Continue Seroquel 300 mg HS Continue risperidone 1 mg twice daily as he is feels it is helping Continue lamotrigine 200 mg twice daily   01/23/22 appt noted: Still doing fine with depression with some seasonal blues.  Involved in couple of Bible studies.  Walking and reading books.  Getting out and doing things.  Men's and couples conferences. [employer]. Does chores.  Good health. Depression 2/10.  Sleep pretty good with occ initial insomnia.  Restorative.   No SE problems  Plan: No change indicated.   Continue Seroquel 300 mg HS Continue risperidone 1 mg twice daily as he is feels it is helping Continue lamotrigine 200 mg twice daily  06/26/22 appt noted: I con't have any depression and anxiety No complaints.  Active at church.   Slowed DT hernia.  Surg 8/4 bc bothers him.  Curtailed physcial activity. No SE. Hard to shut off brain at night.  Reads bible.  Takes awhile to go to sleep.  Chronic problem.  Sleep from 1-8 but would like to sleep more. Plan: No med changes  12/27/2022 appointment noted: CC anxiety more than SE concerns On lamotrigine and Seroquel and lorazepam Ran out of risperidone for a a couple of months had trouble with refill. Wife looked up SE meds. Asks about SE each.  Disc balance issues.  Wife concerned about his memory and balance. Not sedated.  No falls lately.  Is having some memory issues.  STM issues. Everything is ok with quetiapine. Sometimes problems with balance.  Long term issue.  Can sometimes shuffle feet.   Mood for the most part steady with occ flare ups . Anxiety not well controlled.  Taking more lorazepam lately.  Lasts about 8 hours. About 2 daily.  Anxiety picked up 5-6 months ago.  No  pressure on him.  No reason for the anxiety. Plan: Continue Seroquel 300 mg HS Continue risperidone 1 mg twice daily as he is feels it is helping Continue lamotrigine 200 mg twice daily Lorazepam 0.5 mg TIDprn is safest option Check B12, folate, serum lamotrigine level Come to office to evaluate balance issue and for EPS   12/31/22 urgent appt noted:  seen with wife medS: lamotrigine 200 mg BID, quetiapine  300 mg HS, been off risperidone for several months. 2-3 times a week feels anxiety is out of control and takes prn quetiapine 100 mg with anxiety. CC balance issues.  Not dizzy or lightheadedness.  Occ tremor.  No recent falls Sleep 8-9 hours night and nap but he disputes.  Wife says he lays in bed a couple of times daily.  She says it's more than he does. Irritable 3-4/10.  Not lately depressed.  No other manic sx.  When leaves house very anxious, almost housebound.  Sitts and watches TV.  Will go to grocery.  Not going to church for months DT anxiety.   Enjoyed family coming to the house.   Hand shaking about a month or so. Sometimes wife says speech mildly slurred.  No jerks.   Redness of face and saw derm and gave a cream.  He doesn't know the dx. No exercise.    Sleep study negative for OSA noted on chart.  Past Psychiatric Medication Trials: He has had multiple psych med failures as well .   Past psychiatric medications used include pramipexole NR, sertraline,  Trintellix 1.5 mg MWF jittery,  selegiline max '30mg'$  daily, Paxil, Wellbutrin, buspirone,   lamotrigine, carbamazepine, lithium with a tremor,  Depakote was side effects of feeling heavy and increased ammonia,    Seroquel 800 mg a day, Latuda 120 mg a day,  Rexulti,  Perphenazine,  Vraylar '3mg'$  akathisia, He did not tolerate Vraylar even at 1.5 mg 3 days a week. risperidone, olanzapine, quetiapine Caplyta 42 NR  modafinil,  Adderall with selegiline with BP problems, Concerta 54 NR Light therapy failure. ECT twice,  failed 2nd time. Affectively better  after adding pramipexole to 0.5 mg twice daily and increasing Seroquel to 600 mg daily but  lost response apparently soon thereafter.  Psychiatric 2nd opinion: Dr. Yehuda Budd  Review of Systems:  Review of Systems  Constitutional:  Negative for fatigue.  Respiratory:  Positive for shortness of breath.   Cardiovascular:  Negative for chest pain.  Gastrointestinal:  Positive for abdominal pain.  Musculoskeletal:  Positive for back pain.  Neurological:  Negative for tremors and weakness.  Psychiatric/Behavioral:  Negative for agitation, behavioral problems, confusion, decreased concentration, dysphoric mood, hallucinations, self-injury, sleep disturbance and suicidal ideas. The patient is nervous/anxious. The patient is not hyperactive.   Occurs 2-3 times/weekre: leg weakness.  Not orthostatic.  Medications: I have reviewed the patient's current medications.  Current Outpatient Medications  Medication Sig Dispense Refill   aspirin EC 81 MG tablet Take 81 mg by mouth daily.     Cholecalciferol (VITAMIN D3) 25 MCG (1000 UT) CAPS Take 1,000 Units by mouth daily.     L-THEANINE PO Take by mouth.     lamoTRIgine (LAMICTAL) 100 MG tablet TAKE 2 TABLETS(200 MG) BY MOUTH TWICE DAILY 360 tablet 0   levothyroxine (SYNTHROID) 175 MCG tablet Take 175 mcg by mouth daily before breakfast.     LORazepam (ATIVAN) 0.5 MG tablet Take 1 tablet (0.5 mg total) by mouth every 6 (six) hours as needed for anxiety. 90 tablet 1   lovastatin (MEVACOR) 40 MG tablet Take 40 mg by mouth daily.      MAGNESIUM PO Take 3 tablets by mouth daily. 3750 mg total     Omega-3 Fatty Acids (SUPER OMEGA 3 PO) Take 1 capsule by mouth daily.     omeprazole (PRILOSEC) 20 MG capsule Take 20 mg by mouth daily.     QUEtiapine (SEROQUEL) 300 MG tablet TAKE 1 AND 1/2 TABLETS(450 MG) BY MOUTH DAILY 135 tablet 0   traMADol (ULTRAM) 50 MG tablet Take 1-2 tablets (50-100 mg total) by mouth every 6 (six)  hours as needed for moderate pain or severe pain. 20 tablet 0   vitamin B-12 (CYANOCOBALAMIN) 50 MCG tablet Take 50 mcg by mouth daily.     zinc gluconate 50 MG tablet Take 50 mg by mouth daily.     risperiDONE (RISPERDAL) 1 MG tablet TAKE 1 TABLET(1 MG) BY MOUTH TWICE DAILY (Patient not taking: Reported on 12/31/2022) 180 tablet 0   No current facility-administered medications for this visit.    Medication Side Effects:got nervous and irritable with 1.5 mg pramipexole in morning, ok with it split up and no change in mood.  Allergies:  Allergies  Allergen Reactions   Lithium     Makes pt jittery/causes anxiety    Ambien [Zolpidem Tartrate] Anxiety    Nervous, uncontrollable   Sulfa Antibiotics Other (See Comments)    UNSPECIFIED REACTION OF CHILDHOOD   Sulfamethoxazole Other (See Comments)    UNSPECIFIED REACTION OF CHILDHOOD    Past Medical History:  Diagnosis Date   Acquired hallux rigidus of right foot 04/26/2017   AKI (acute kidney injury) ([place]) 06/11/2017   Anxiety    Bipolar 1 disorder (HCC)    BMI 37.0-37.9, adult    BPH (benign prostatic hyperplasia)    Cataract    Cataract    L eye   CKD (chronic kidney disease), stage III (HCC)    Colon polyps    COPD GOLD II with restrictive component  01/13/2016   Spirometry 01/13/2016  FEV1 1.84 (47%)  Ratio 62  - 01/13/2016  extensive coaching HFA effectiveness =    90% > try stiolto respimat 2 pffs  each am > did not benefit so stopped when sample out - 01/13/2016  Walked RA x 3 laps @ 185 ft each stopped due to  End of study, nl pace, no desat  / min sob  - PFT's  03/16/2016  FEV1 2.28 (59 % ) ratio 67  p 12 % improvement from saba p no prior to study with DLCO  66 % corrects to 86 % for alv volume      Depression    Dysrhythmia    Essential hypertension 01/19/2015   Family history of coronary arteriosclerosis 01/19/2015   Father with MI    Fatty liver    GERD (gastroesophageal reflux disease) 08/11/2014   History of colon polyps 06/18/2017    Hyperlipidemia    Hypertension    Hypothyroidism 08/11/2014   Hypothyroidism    Insomnia 06/18/2017   Insomnia    Memory change    Mixed hyperlipidemia 06/18/2017   Morbid obesity ([place]) 1/0/2725   Complicated by HBP/ Low erv on pfts 03/16/2016 (31%)     Morbid obesity due to excess calories (HCC)    Prediabetes    Sleep apnea    SOB (shortness of breath)    Thyroid disease    Tinnitus of both ears 06/18/2017   Tobacco use 06/18/2017   Unsteadiness on feet    Vitamin D deficiency 06/18/2017   Vitamin D deficiency     Family History  Problem Relation Age of Onset   Cancer Mother    Hyperlipidemia Father    Hypertension Father    CAD Father    Stroke Father    Aortic aneurysm Father    Prostate cancer Father    Hyperlipidemia Brother    Appendicitis Maternal Grandfather     Social History   Socioeconomic History   Marital status: Married    Spouse name: Not on file   Number of children: Not on file   Years of education: Not on file   Highest education level: Associate degree: occupational, Hotel manager, or vocational program  Occupational History   Occupation: act.  assist  Tobacco Use   Smoking status: Former    Packs/day: 1.00    Years: 25.00    Total pack years: 25.00    Types: Cigarettes    Quit date: 12/10/2000    Years since quitting: 22.0   Smokeless tobacco: Never   Tobacco comments:    heavy vape user- quit vaping 06/04/2017  Vaping Use   Vaping Use: Former  Substance and Sexual Activity   Alcohol use: No    Alcohol/week: 0.0 standard drinks of alcohol   Drug use: No   Sexual activity: Not on file  Other Topics Concern   Not on file  Social History Narrative   Admitted to [facility] 06/14/17- discharged   Lives at home with his wife   Married - [name]   Former smoker - stopped 2002   Alcohol none   Full code   Right handed   Drinks 2 cups of caffeine daily   Social Determinants of Radio broadcast assistant Strain: Not on file  Food Insecurity:  Not on file  Transportation Needs: Not on file  Physical Activity: Not on file  Stress: Not on file  Social Connections: Not on file  Intimate Partner Violence: Not on file    Past Medical History, Surgical history, Social history, and Family history were reviewed and updated as appropriate.   Please see review of systems for further details on the  patient's review from today.   Objective:   Physical Exam:  There were no vitals taken for this visit.  Physical Exam Neurological:     Mental Status: He is alert and oriented to person, place, and time.     Cranial Nerves: No dysarthria.     Coordination: Coordination abnormal. Heel to Shin Test abnormal.     Gait: Gait abnormal.  Psychiatric:        Attention and Perception: Attention and perception normal.        Mood and Affect: Mood is anxious. Mood is not depressed. Affect is not blunt or tearful.        Speech: Speech normal. Speech is not rapid and pressured.        Behavior: Behavior is cooperative.        Thought Content: Thought content normal. Thought content is not paranoid or delusional. Thought content does not include homicidal or suicidal ideation. Thought content does not include suicidal plan.        Cognition and Memory: Cognition and memory normal.        Judgment: Judgment normal.     Comments: Insight intact Can calculate dosages. Mild shuffling gait.    Lab Review:     Component Value Date/Time   NA 138 02/21/2022 1054   K 4.8 02/21/2022 1054   CL 104 02/21/2022 1054   CO2 28 02/21/2022 1054   GLUCOSE 118 (H) 02/21/2022 1054   BUN 17 02/21/2022 1054   CREATININE 1.07 02/21/2022 1054   CALCIUM 9.5 02/21/2022 1054   PROT 6.9 02/11/2018 0623   ALBUMIN 4.2 02/11/2018 0623   AST 25 02/11/2018 0623   ALT 33 02/11/2018 0623   ALKPHOS 89 02/11/2018 0623   BILITOT 0.9 02/11/2018 0623   GFRNONAA >60 02/21/2022 1054   GFRAA >60 08/13/2018 1020       Component Value Date/Time   WBC 5.5 02/21/2022 1054    RBC 5.34 02/21/2022 1054   HGB 16.7 02/21/2022 1054   HCT 50.7 02/21/2022 1054   PLT 204 02/21/2022 1054   MCV 94.9 02/21/2022 1054   MCH 31.3 02/21/2022 1054   MCHC 32.9 02/21/2022 1054   RDW 12.4 02/21/2022 1054   LYMPHSABS 1.3 08/13/2018 1020   MONOABS 0.7 08/13/2018 1020   EOSABS 0.1 08/13/2018 1020   BASOSABS 0.1 08/13/2018 1020    No results found for: "POCLITH", "LITHIUM"   No results found for: "PHENYTOIN", "PHENOBARB", "VALPROATE", "CBMZ"   12/28/22      Component Ref Range & Units 3 d ago  Lamotrigine Lvl 2.5 - 15.0 mcg/mL 10.8    On 200 mg BID    .res Assessment: Plan:    Severe bipolar I disorder with depression ([place])  Generalized anxiety disorder  Seasonal depression (HCC)  Mild cognitive impairment  Balance problem    Severe TRD and anxiety.   Seasonal pattern of depression in bipolar is not uncommon  Greater than 50% of 30 min non face to face time with patient was spent on counseling and coordination of care. Disc history of TRD.  He is markedly better at this point on quetiapine 300 mg nightly and lamotrigine 200 mg twice daily and risperidone 1 mg twice daily.  He is not needing anxiety medicines or sleep medicines.  He is tolerating the medications well. Also we discussed at length that the increased and his physical and social activity and spiritual activity is clearly having a marked positive effect on his mood as well.  It  is unlikely for medication alone to maintain him with regard to his mood.  He has a history of multiple recurrences and very unstable bipolar disorder.  He agrees to try to keep continue his positive social and physical behaviors.  He is also added gym 5 days a week which is clearly helping.  Disc each med and SE issues.  Disc purpose of each.  There is no way to treat his history of psych problems without meds that have potential SE in these areas.   Disc balancing benefit / SE ratio with each med.  Answered questions  about supplements.  Rec FU with neuro to further evaluate chronic balance issues.  Dr. Jaynee Eagles evaluated him for this in the past 02/2018 without etiology defined.  Had this problem for years.  Continue Seroquel 300 mg HS  Reduce lamotrigine 100 mg AM and 200 mg HS to see if balance is better given level of 10.5  Lorazepam 0.5 mg TID prn is safest option  Checked B12, folate 12/28/22 were normal.   serum lamotrigine level was 10.5 on 200 bid. TSH low may be cause anxiety.  Send to PCP [facility]  Come to office to evaluate balance issue and for EPS   Discussed potential metabolic side effects associated with atypical antipsychotics, as well as potential risk for movement side effects. Advised pt to contact office if movement side effects occur.   We discussed the short-term risks associated with benzodiazepines including sedation and increased fall risk among others.  Discussed long-term side effect risk including dependence, potential withdrawal symptoms, and the potential eventual dose-related risk of dementia.  But recent studies from 2020 dispute this association between benzodiazepines and dementia risk. Newer studies in 2020 do not support an association with dementia.  Discussed relapse prevention techniques in view of upcoming hernia surgery.  That we will certainly reduce his ability to stay active for a while.  Disc   FU 2 mos  Next appt in office.  Lynder Parents, MD, DFAPA    No future appointments.    No orders of the defined types were placed in this encounter.      -------------------------------

## 2023-01-01 ENCOUNTER — Other Ambulatory Visit: Payer: Self-pay

## 2023-01-01 ENCOUNTER — Telehealth: Payer: Self-pay | Admitting: Psychiatry

## 2023-01-01 DIAGNOSIS — F314 Bipolar disorder, current episode depressed, severe, without psychotic features: Secondary | ICD-10-CM

## 2023-01-01 MED ORDER — QUETIAPINE FUMARATE 300 MG PO TABS: ORAL_TABLET | ORAL | 0 refills | Status: DC

## 2023-01-01 NOTE — Telephone Encounter (Signed)
Pharmacy needed a new rx,informed pt rx sent

## 2023-01-01 NOTE — Telephone Encounter (Signed)
Will call pharmacy.

## 2023-01-01 NOTE — Telephone Encounter (Signed)
Pt LVM out of Quetiapine. Pharmacy said too soon. CC aware and stated will take care of it at apt 1/22. Rx was sent 1/19 to [facility].

## 2023-01-06 ENCOUNTER — Other Ambulatory Visit: Payer: Self-pay | Admitting: Psychiatry

## 2023-01-06 DIAGNOSIS — F3162 Bipolar disorder, current episode mixed, moderate: Secondary | ICD-10-CM

## 2023-01-08 NOTE — Addendum Note (Signed)
Addended by: Reatha Armour on: 01/08/2023 05:32 PM   Modules accepted: Level of Service

## 2023-01-21 ENCOUNTER — Telehealth: Payer: Self-pay | Admitting: Psychiatry

## 2023-01-21 NOTE — Telephone Encounter (Signed)
Called pharmacy and they said only an 8-day supply is covered, but probably based on the 1/18 fill.  It may be a qty issue. There is nothing in CMM, but I think there may be a fax in the folder.  On 1/18 he got 90 tablets, which they show is a 22-day supply. He has been getting #90 for 30 days previously.

## 2023-01-21 NOTE — Telephone Encounter (Signed)
Reginald Tucker' wife called to report that he needs a PA for his Lorazepam.  She has talked with the insurance and they told her they would send [place] a form.  The pharmacy gave them a temporary supply on 1/18.  He need to get a refill soon.  Please complete a PA if you hav not already done so.

## 2023-01-25 ENCOUNTER — Other Ambulatory Visit: Payer: Self-pay

## 2023-01-25 DIAGNOSIS — F411 Generalized anxiety disorder: Secondary | ICD-10-CM

## 2023-01-25 MED ORDER — LORAZEPAM 0.5 MG PO TABS: 0.5000 mg | ORAL_TABLET | Freq: Three times a day (TID) | ORAL | 1 refills | Status: DC | PRN

## 2023-01-25 NOTE — Telephone Encounter (Signed)
Reginald Tucker called again regarding the previous messages that he has sent regarding his Lorazepam. He needs an update on the prior authorization that was submitted. Please call him with an update at [phone].

## 2023-01-25 NOTE — Telephone Encounter (Signed)
Tried submitting a prior authorization through [facility] and it reports medication available. [employer] will probably have to be called instead. We haven't done a previous PA so not sure the exact issue.

## 2023-01-25 NOTE — Telephone Encounter (Signed)
Spoke with patient and he states that his Lorazepam is not working. He would like to up the dosage or try something else. [place]: [id number]

## 2023-01-25 NOTE — Telephone Encounter (Signed)
There is an issue with the pharmacy getting his lorazepam filled for some reason. The way the instructions read 1 tablet every 6 hours #90, this shows up in the pharmacy system as a 22 day supply instead of 30 day. I will change Rx to tid since that is really what he takes daily so insurance will cover.

## 2023-01-25 NOTE — Telephone Encounter (Signed)
See message.

## 2023-01-25 NOTE — Telephone Encounter (Signed)
Ok

## 2023-03-01 DIAGNOSIS — E039 Hypothyroidism, unspecified: Secondary | ICD-10-CM | POA: Diagnosis not present

## 2023-03-01 DIAGNOSIS — R7303 Prediabetes: Secondary | ICD-10-CM | POA: Diagnosis not present

## 2023-03-01 DIAGNOSIS — J449 Chronic obstructive pulmonary disease, unspecified: Secondary | ICD-10-CM | POA: Diagnosis not present

## 2023-03-01 DIAGNOSIS — R2689 Other abnormalities of gait and mobility: Secondary | ICD-10-CM | POA: Diagnosis not present

## 2023-03-01 DIAGNOSIS — Z0000 Encounter for general adult medical examination without abnormal findings: Secondary | ICD-10-CM | POA: Diagnosis not present

## 2023-03-01 DIAGNOSIS — Z125 Encounter for screening for malignant neoplasm of prostate: Secondary | ICD-10-CM | POA: Diagnosis not present

## 2023-03-01 DIAGNOSIS — E559 Vitamin D deficiency, unspecified: Secondary | ICD-10-CM | POA: Diagnosis not present

## 2023-03-01 DIAGNOSIS — I10 Essential (primary) hypertension: Secondary | ICD-10-CM | POA: Diagnosis not present

## 2023-03-01 DIAGNOSIS — E782 Mixed hyperlipidemia: Secondary | ICD-10-CM | POA: Diagnosis not present

## 2023-03-01 DIAGNOSIS — F319 Bipolar disorder, unspecified: Secondary | ICD-10-CM | POA: Diagnosis not present

## 2023-03-04 ENCOUNTER — Encounter: Payer: Self-pay | Admitting: Psychiatry

## 2023-03-04 ENCOUNTER — Ambulatory Visit (INDEPENDENT_AMBULATORY_CARE_PROVIDER_SITE_OTHER): Payer: Medicare HMO | Admitting: Psychiatry

## 2023-03-04 DIAGNOSIS — R2689 Other abnormalities of gait and mobility: Secondary | ICD-10-CM | POA: Diagnosis not present

## 2023-03-04 DIAGNOSIS — F314 Bipolar disorder, current episode depressed, severe, without psychotic features: Secondary | ICD-10-CM | POA: Diagnosis not present

## 2023-03-04 DIAGNOSIS — F900 Attention-deficit hyperactivity disorder, predominantly inattentive type: Secondary | ICD-10-CM

## 2023-03-04 DIAGNOSIS — F411 Generalized anxiety disorder: Secondary | ICD-10-CM

## 2023-03-04 DIAGNOSIS — G3184 Mild cognitive impairment, so stated: Secondary | ICD-10-CM | POA: Diagnosis not present

## 2023-03-04 MED ORDER — LORAZEPAM 1 MG PO TABS: 1.0000 mg | ORAL_TABLET | Freq: Three times a day (TID) | ORAL | 1 refills | Status: DC

## 2023-03-04 NOTE — Progress Notes (Signed)
------------------------------- [id number] 02-21-1956 67 y.o.  Virtual Visit via Telephone Note  I connected with pt by telephone and verified that I am speaking with the correct person using two identifiers.   I discussed the limitations, risks, security and privacy concerns of performing an evaluation and management service by telephone and the availability of in person appointments. I also discussed with the patient that there may be a patient responsible charge related to this service. The patient expressed understanding and agreed to proceed.  I discussed the assessment and treatment plan with the patient. The patient was provided an opportunity to ask questions and all were answered. The patient agreed with the plan and demonstrated an understanding of the instructions.   The patient was advised to call back or seek an in-person evaluation if the symptoms worsen or if the condition fails to improve as anticipated.  I provided 20 minutes of non-face-to-face time during this encounter. The call started at 1245 and ended at 105. The patient was located at home and the provider was located office.   Subjective:   Patient ID:  Reginald Tucker is a 67 y.o. (DOB 1956-11-07) male.  Chief Complaint:  Chief Complaint  Patient presents with   Follow-up   Depression   Anxiety   Fatigue    Anxiety Symptoms include nervous/anxious behavior and shortness of breath. Patient reports no chest pain, confusion, decreased concentration or suicidal ideas.    Depression        Associated symptoms include no decreased concentration, no fatigue and no suicidal ideas.  Past medical history includes anxiety.    Reginald Tucker presents to the office today for follow-up of TRD and anxiety.  When seen April 06, 2019.  He had not seen any mood benefit from low-dose pramipexole and had side effects at higher dosages.  Therefore we weaned him off of the that medication.  visit August 07, 2019 and the  following changes were made: Option retry Vraylar with selegiline which wasn't adequately done before.  Prior trial inadequate duration and SE Retry Vraylar at a lower dosage to prevent tremors: 1 capsule every Monday, Wednesday, Friday only. Made him jittery and stopped after a week.  Didn't see mood benefit.  October 2020 was last appointment and there were no med changes as the patient was going to seek a second opinion.  The following was noted: Still irritable and real tired.  Easily fatigued with normal activity.  Appt with PCP Oct 27 to evaluate. Not much tremor. Waves of depression worse in the morning.  Sleeping too much in recliner and lethargic.  Deep sleep.  Takes 60 min to fall asleep.  Cant' shut off his brain and hard to fall asleep.  Reads a lot and enjoys it especially history.  Pt reports that mood is Anxious, Depressed and Irritable rated 7/10 bad and describes anxiety as Moderate and occassional.  Less anxiety this visit than depression..  Staying in the house is making it worse too.   Anxiety symptoms include: Excessive Worry, Panic Symptoms, Social Anxiety,. Has claustrophobia.  Hard to ride elevators .  Can't go up the steps here.  Pt reports no sleep issues. Pt reports that appetite is good. Pt reports that energy is poor and anhedonia, loss of interest or pleasure in usual activities, poor motivation and withdrawn from usual activities. Concentration is down slightly. Suicidal thoughts:  denied by patient.  Still has days that does very little.  Will clean occ.   10/17/2020 appointment with  the following noted:  Wife joined Patient has requested refills from this location but has not been seen here in a year which is not appropriate given his level of instability historically. Didn't go for second opinion since here.   Still doing about the same without much change.  Some days are better than others. Wife says he does some things and will go out sometimes.  Doesn't want to go  out at dark.  Thinks winter is the worst time. Asked about [place]. Tolerating meds OK.   Chronically depressed and easily irritable, without pattern except brief. Wife says some weeks are worse than others. No walking or exercise.  Balance issues.  Was better when did PT but he quit the exercises. Patient is highly claustrophobic and states is getting worse.  He is having trouble using the elevator to get to our office.  He asked about finding another psychiatrist who has an office on the first floor and we discussed options.  Normal card review recently.  02/21/21 appt noted: W involved in call. Very sick with covid pneumonia and nearly died in 2023/01/23 and still recovering. Doing breathing exercises and some walking.  Weak in am. A lot of depression in hsopital thinking he would die.  Now dep 7/10.   No change in mood off selegiline per pt or Reginald Tucker but she's not sure. Concerta didn't help and they agree.  He stopped it and went back on selegiline. Memory is bad. Taking melatonin 10 comes and goes with benefit.  Takes 1 and 1/2 hours to go to sleep. To bed 1110 and up at 9-930 Plan: DC selegiline   Wait 5 days to clear and start MPH ER 36 mg each AM.   If NR after 2 weeks, then ER increase if they call.  03/29/2021 phone call from wife reporting that [place] was working and they wanted more samples while we worked on a [place]  04/24/2021 appointment with the following noted:  Wife on session also Took MPH and couldn't tell a difference except wife said he was more anxious.  Stopped it. Caplyta 42 mg daily and now says he's worse.  Not doing well at all.  Wonders if he's worse bc of the reduction in Seroquel.  Going through hell for 3-4 weeks with 8/10 depression.  No SI. Getting up earlier. More anxiety and hard to lay down. Came to a head with wife saying she's leaving for 24 days to help her daughter with a child.  She was gone 10 days in [facility].  Inconsistency in life.  Better if going to church but  usually doesn't do it. Got saved my life from [employer] DT failure. Return to Seroquel 400 bc worse without it. He wants to use risperidone 1 mg BID prn anxiety.  06/21/2021 appointment with the following noted:  Wife on call Only increased Seroquel to 300 mg HS. Doing great and about 3/10 depression.  Started improving pretty quickly.  Went back to church and out with friends.  W gone 3 weeks.  Going to Bible study.  Got together with friends he hasn't seen  in 4 years. I feel good. No SE. Sleep good usually.  Not sleeping excessively as much as in the past about 8-9 hours.  Anxiety is etter not gone.   Trying to walk without a cane.  Recent PCP was unremarkable.  Lost to 218#.    09/26/21 appt noted: wife also on call Great.  Gym 5-6 days per week.  Lost 25#.  Getting out more.  Involved in church.  Feels better than last time and more active and socializing.  Mostly out of depression 1-2/10. No SE.  Better with queiapine 300 vs 400 re: sedation. Wife agrees he's dramatically better all around. Needs and gets 8-9 hours nightly and 2 brief naps daily. Anxiety is manageable and risperidone helps.  Risperidone also helps keep down the level of irritability which is occasional and manageable. Plan: Continue Seroquel 300 mg HS Continue risperidone 1 mg twice daily as he is feels it is helping Continue lamotrigine 200 mg twice daily   01/23/22 appt noted: Still doing fine with depression with some seasonal blues.  Involved in couple of Bible studies.  Walking and reading books.  Getting out and doing things.  Men's and couples conferences. [employer]. Does chores.  Good health. Depression 2/10.  Sleep pretty good with occ initial insomnia.  Restorative.   No SE problems Plan: No change indicated.   Continue Seroquel 300 mg HS Continue risperidone 1 mg twice daily as he is feels it is helping Continue lamotrigine 200 mg twice daily  06/26/22 appt noted: I con't  have any depression and anxiety No complaints.  Active at church.   Slowed DT hernia.  Surg 8/4 bc bothers him.  Curtailed physcial activity. No SE. Hard to shut off brain at night.  Reads bible.  Takes awhile to go to sleep.  Chronic problem.  Sleep from 1-8 but would like to sleep more. Plan: No med changes  12/27/2022 appointment noted: CC anxiety more than SE concerns On lamotrigine and Seroquel and lorazepam Ran out of risperidone for a a couple of months had trouble with refill. Wife looked up SE meds. Asks about SE each.  Disc balance issues.  Wife concerned about his memory and balance. Not sedated.  No falls lately.  Is having some memory issues.  STM issues. Everything is ok with quetiapine. Sometimes problems with balance.  Long term issue.  Can sometimes shuffle feet.   Mood for the most part steady with occ flare ups . Anxiety not well controlled.  Taking more lorazepam lately.  Lasts about 8 hours. About 2 daily.  Anxiety picked up 5-6 months ago.  No pressure on him.  No reason for the anxiety. Plan: Continue Seroquel 300 mg HS Continue risperidone 1 mg twice daily as he is feels it is helping Continue lamotrigine 200 mg twice daily Lorazepam 0.5 mg TIDprn is safest option Check B12, folate, serum lamotrigine level Come to office to evaluate balance issue and for EPS  12/31/22 urgent appt noted:  seen with wife, seen in office medS: lamotrigine 200 mg BID, quetiapine  300 mg HS, been off risperidone for several months. 2-3 times a week feels anxiety is out of control and takes prn quetiapine 100 mg with anxiety. CC balance issues.  Not dizzy or lightheadedness.  Occ tremor.  No recent falls Sleep 8-9 hours night and nap but he disputes.  Wife says he lays in bed a couple of times daily.  She says it's more than he does. Irritable 3-4/10.  Not lately depressed.  No other manic sx.  When leaves house very anxious, almost housebound.  Sitts and watches TV.  Will go to  grocery.  Not going to church for months DT anxiety.   Enjoyed family coming to the house.   Hand shaking about a month or so. Sometimes wife says speech mildly slurred.  No jerks.  Redness of face and saw derm and gave a cream.  He doesn't know the dx. No exercise.   Plan: Continue Seroquel 300 mg HS Reduce lamotrigine 100 mg AM and 200 mg HS to see if balance is better given level of 10.5 Lorazepam 0.5 mg TID prn is safest option Checked B12, folate 12/28/22 were normal.   serum lamotrigine level was 10.5 on 200 bid. TSH low may be cause anxiety.  Send to PCP [facility] Come to office to evaluate balance issue and for EPS  03/04/23 appt noted:  Good and more active.  Getting out more. Doesn't feel lorazepam is high enough.  Needs 1 mg in AM and then needs more in the afternoon.Marland Kitchen  a lot going on in the brain with antsy feelings.  Anxiety is not well controlled.    No SE with meds. Balance is better.  No leg jerking.  Sleep is pretty good overall, usually quicker to sleep, more refreshed.   Sleep study negative for OSA noted on chart.  Past Psychiatric Medication Trials: He has had multiple psych med failures as well .   Past psychiatric medications used include pramipexole NR, sertraline,  Trintellix 1.5 mg MWF jittery,  selegiline max 30mg  daily, Paxil, Wellbutrin, buspirone,   lamotrigine, carbamazepine, lithium with a tremor,  Depakote was side effects of feeling heavy and increased ammonia,    Seroquel 800 mg a day, Latuda 120 mg a day,  Rexulti,  Perphenazine,  Vraylar 3mg  akathisia, He did not tolerate Vraylar even at 1.5 mg 3 days a week. risperidone, olanzapine, quetiapine Caplyta 42 NR  modafinil,  Adderall with selegiline with BP problems, Concerta 54 NR Light therapy failure. ECT twice, failed 2nd time. Affectively better  after adding pramipexole to 0.5 mg twice daily and increasing Seroquel to 600 mg daily but lost response apparently soon thereafter.  Psychiatric 2nd  opinion: Dr. Yehuda Budd  Review of Systems:  Review of Systems  Constitutional:  Negative for fatigue.  Respiratory:  Positive for shortness of breath.   Cardiovascular:  Negative for chest pain.  Gastrointestinal:  Positive for abdominal pain.  Musculoskeletal:  Positive for back pain.  Neurological:  Negative for tremors.  Psychiatric/Behavioral:  Negative for agitation, behavioral problems, confusion, decreased concentration, dysphoric mood, hallucinations, self-injury, sleep disturbance and suicidal ideas. The patient is nervous/anxious. The patient is not hyperactive.   Occurs 2-3 times/weekre: leg weakness.  Not orthostatic.  Medications: I have reviewed the patient's current medications.  Current Outpatient Medications  Medication Sig Dispense Refill   aspirin EC 81 MG tablet Take 81 mg by mouth daily.     Cholecalciferol (VITAMIN D3) 25 MCG (1000 UT) CAPS Take 1,000 Units by mouth daily.     L-THEANINE PO Take by mouth.     lamoTRIgine (LAMICTAL) 100 MG tablet TAKE 1 TABLETS(200 MG) BY MOUTH IN THE MORNING AND 2 TABLETS AT NIGHT. 360 tablet 0   levothyroxine (SYNTHROID) 175 MCG tablet Take 175 mcg by mouth daily before breakfast.     LORazepam (ATIVAN) 0.5 MG tablet Take 1 tablet (0.5 mg total) by mouth 3 (three) times daily as needed for anxiety. 90 tablet 1   lovastatin (MEVACOR) 40 MG tablet Take 40 mg by mouth daily.      MAGNESIUM PO Take 3 tablets by mouth daily. 3750 mg total     Omega-3 Fatty Acids (SUPER OMEGA 3 PO) Take 1 capsule by mouth daily.     omeprazole (PRILOSEC) 20 MG capsule Take 20 mg by  mouth daily.     QUEtiapine (SEROQUEL) 300 MG tablet TAKE 1 AND 1/2 TABLETS(450 MG) BY MOUTH DAILY 135 tablet 0   risperiDONE (RISPERDAL) 1 MG tablet TAKE 1 TABLET(1 MG) BY MOUTH TWICE DAILY (Patient not taking: Reported on 12/31/2022) 180 tablet 0   traMADol (ULTRAM) 50 MG tablet Take 1-2 tablets (50-100 mg total) by mouth every 6 (six) hours as needed for moderate pain or  severe pain. 20 tablet 0   vitamin B-12 (CYANOCOBALAMIN) 50 MCG tablet Take 50 mcg by mouth daily.     zinc gluconate 50 MG tablet Take 50 mg by mouth daily.     No current facility-administered medications for this visit.    Medication Side Effects:got nervous and irritable with 1.5 mg pramipexole in morning, ok with it split up and no change in mood.  Allergies:  Allergies  Allergen Reactions   Lithium     Makes pt jittery/causes anxiety    Ambien [Zolpidem Tartrate] Anxiety    Nervous, uncontrollable   Sulfa Antibiotics Other (See Comments)    UNSPECIFIED REACTION OF CHILDHOOD   Sulfamethoxazole Other (See Comments)    UNSPECIFIED REACTION OF CHILDHOOD    Past Medical History:  Diagnosis Date   Acquired hallux rigidus of right foot 04/26/2017   AKI (acute kidney injury) ([place]) 06/11/2017   Anxiety    Bipolar 1 disorder (HCC)    BMI 37.0-37.9, adult    BPH (benign prostatic hyperplasia)    Cataract    Cataract    L eye   CKD (chronic kidney disease), stage III (HCC)    Colon polyps    COPD GOLD II with restrictive component  01/13/2016   Spirometry 01/13/2016  FEV1 1.84 (47%)  Ratio 62  - 01/13/2016  extensive coaching HFA effectiveness =    90% > try stiolto respimat 2 pffs each am > did not benefit so stopped when sample out - 01/13/2016  Walked RA x 3 laps @ 185 ft each stopped due to  End of study, nl pace, no desat  / min sob  - PFT's  03/16/2016  FEV1 2.28 (59 % ) ratio 67  p 12 % improvement from saba p no prior to study with DLCO  66 % corrects to 86 % for alv volume      Depression    Dysrhythmia    Essential hypertension 01/19/2015   Family history of coronary arteriosclerosis 01/19/2015   Father with MI    Fatty liver    GERD (gastroesophageal reflux disease) 08/11/2014   History of colon polyps 06/18/2017   Hyperlipidemia    Hypertension    Hypothyroidism 08/11/2014   Hypothyroidism    Insomnia 06/18/2017   Insomnia    Memory change    Mixed hyperlipidemia 06/18/2017    Morbid obesity ([place]) [id number]   Complicated by HBP/ Low erv on pfts 03/16/2016 (31%)     Morbid obesity due to excess calories (HCC)    Prediabetes    Sleep apnea    SOB (shortness of breath)    Thyroid disease    Tinnitus of both ears 06/18/2017   Tobacco use 06/18/2017   Unsteadiness on feet    Vitamin D deficiency 06/18/2017   Vitamin D deficiency     Family History  Problem Relation Age of Onset   Cancer Mother    Hyperlipidemia Father    Hypertension Father    CAD Father    Stroke Father    Aortic aneurysm Father  Prostate cancer Father    Hyperlipidemia Brother    Appendicitis Maternal Grandfather     Social History   Socioeconomic History   Marital status: Married    Spouse name: Not on file   Number of children: Not on file   Years of education: Not on file   Highest education level: Associate degree: occupational, Hotel manager, or vocational program  Occupational History   Occupation: act.  assist  Tobacco Use   Smoking status: Former    Packs/day: 1.00    Years: 25.00    Additional pack years: 0.00    Total pack years: 25.00    Types: Cigarettes    Quit date: 12/10/2000    Years since quitting: 22.2   Smokeless tobacco: Never   Tobacco comments:    heavy vape user- quit vaping 06/04/2017  Vaping Use   Vaping Use: Former  Substance and Sexual Activity   Alcohol use: No    Alcohol/week: 0.0 standard drinks of alcohol   Drug use: No   Sexual activity: Not on file  Other Topics Concern   Not on file  Social History Narrative   Admitted to [facility] 06/14/17- discharged   Lives at home with his wife   Married - Reginald Tucker   Former smoker - stopped 2002   Alcohol none   Full code   Right handed   Drinks 2 cups of caffeine daily   Social Determinants of Radio broadcast assistant Strain: Not on file  Food Insecurity: Not on file  Transportation Needs: Not on file  Physical Activity: Not on file  Stress: Not on file  Social Connections: Not on file   Intimate Partner Violence: Not on file    Past Medical History, Surgical history, Social history, and Family history were reviewed and updated as appropriate.   Please see review of systems for further details on the patient's review from today.   Objective:   Physical Exam:  There were no vitals taken for this visit.  Physical Exam Neurological:     Mental Status: He is alert and oriented to person, place, and time.     Cranial Nerves: No dysarthria.  Psychiatric:        Attention and Perception: Attention and perception normal.        Mood and Affect: Mood is anxious.        Speech: Speech normal.        Behavior: Behavior is cooperative.        Thought Content: Thought content normal. Thought content is not paranoid or delusional. Thought content does not include homicidal or suicidal ideation. Thought content does not include suicidal plan.        Cognition and Memory: Cognition and memory normal.        Judgment: Judgment normal.     Comments: Insight intact    Lab Review:     Component Value Date/Time   NA 138 02/21/2022 1054   K 4.8 02/21/2022 1054   CL 104 02/21/2022 1054   CO2 28 02/21/2022 1054   GLUCOSE 118 (H) 02/21/2022 1054   BUN 17 02/21/2022 1054   CREATININE 1.07 02/21/2022 1054   CALCIUM 9.5 02/21/2022 1054   PROT 6.9 02/11/2018 0623   ALBUMIN 4.2 02/11/2018 0623   AST 25 02/11/2018 0623   ALT 33 02/11/2018 0623   ALKPHOS 89 02/11/2018 0623   BILITOT 0.9 02/11/2018 0623   GFRNONAA >60 02/21/2022 1054   GFRAA >60 08/13/2018 1020  Component Value Date/Time   WBC 5.5 02/21/2022 1054   RBC 5.34 02/21/2022 1054   HGB 16.7 02/21/2022 1054   HCT 50.7 02/21/2022 1054   PLT 204 02/21/2022 1054   MCV 94.9 02/21/2022 1054   MCH 31.3 02/21/2022 1054   MCHC 32.9 02/21/2022 1054   RDW 12.4 02/21/2022 1054   LYMPHSABS 1.3 08/13/2018 1020   MONOABS 0.7 08/13/2018 1020   EOSABS 0.1 08/13/2018 1020   BASOSABS 0.1 08/13/2018 1020    No results  found for: "POCLITH", "LITHIUM"   No results found for: "PHENYTOIN", "PHENOBARB", "VALPROATE", "CBMZ"   12/28/22      Component Ref Range & Units 3 d ago  Lamotrigine Lvl 2.5 - 15.0 mcg/mL 10.8    On 200 mg BID    .res Assessment: Plan:    Severe bipolar I disorder with depression ([place])  Generalized anxiety disorder  Mild cognitive impairment  Attention deficit hyperactivity disorder (ADHD), predominantly inattentive type  Balance problem    Severe TRD and anxiety.   Seasonal pattern of depression in bipolar is not uncommon   Disc history of TRD.  He is markedly better at this point on quetiapine 300 mg nightly and lamotrigine 300 mg daily and off risperidone 1 mg twice daily.    Also we discussed at length that the increased and his physical and social activity and spiritual activity is clearly having a marked positive effect on his mood as well.  It is unlikely for medication alone to maintain him with regard to his mood.  He has a history of multiple recurrences and very unstable bipolar disorder.  He agrees to try to keep continue his positive social and physical behaviors.  He is also added gym 5 days a week which is clearly helping.  Disc each med and SE issues.  Disc purpose of each.  There is no way to treat his history of psych problems without meds that have potential SE in these areas.   Disc balancing benefit / SE ratio with each med.  Rec FU with neuro to further evaluate chronic balance issues.  Dr. Jaynee Eagles evaluated him for this in the past 02/2018 without etiology defined.  Had this problem for years.  Continue Seroquel 300 -450 mg HS  Better with Reduced lamotrigine 100 mg AM and 200 mg HS to see if balance is better given level of 10.5 on 400 mg daily.  Increase Lorazepam 1 mg TID prn is safest option vs switch to clonazepam for unmanaged anxiety  Checked B12, folate 12/28/22 were normal.   serum lamotrigine level was 10.5 on 200 bid. TSH low may be cause  anxiety.  Send to PCP [facility]   Discussed potential metabolic side effects associated with atypical antipsychotics, as well as potential risk for movement side effects. Advised pt to contact office if movement side effects occur.   We discussed the short-term risks associated with benzodiazepines including sedation and increased fall risk among others.  Discussed long-term side effect risk including dependence, potential withdrawal symptoms, and the potential eventual dose-related risk of dementia.  But recent studies from 2020 dispute this association between benzodiazepines and dementia risk. Newer studies in 2020 do not support an association with dementia.  FU 2 mos  Lynder Parents, MD, DFAPA    No future appointments.    No orders of the defined types were placed in this encounter.      -------------------------------

## 2023-03-27 ENCOUNTER — Other Ambulatory Visit: Payer: Self-pay | Admitting: Psychiatry

## 2023-03-27 DIAGNOSIS — F314 Bipolar disorder, current episode depressed, severe, without psychotic features: Secondary | ICD-10-CM

## 2023-04-28 ENCOUNTER — Other Ambulatory Visit: Payer: Self-pay | Admitting: Psychiatry

## 2023-04-28 DIAGNOSIS — F3162 Bipolar disorder, current episode mixed, moderate: Secondary | ICD-10-CM

## 2023-04-28 NOTE — Telephone Encounter (Signed)
Has appt 5/28

## 2023-05-02 ENCOUNTER — Other Ambulatory Visit: Payer: Self-pay | Admitting: Psychiatry

## 2023-05-02 DIAGNOSIS — F411 Generalized anxiety disorder: Secondary | ICD-10-CM

## 2023-05-02 NOTE — Telephone Encounter (Signed)
Pt also called about RF.

## 2023-05-02 NOTE — Telephone Encounter (Signed)
Pended to Dr. Jennelle Human already

## 2023-05-07 ENCOUNTER — Encounter: Payer: Self-pay | Admitting: Psychiatry

## 2023-05-07 ENCOUNTER — Ambulatory Visit (INDEPENDENT_AMBULATORY_CARE_PROVIDER_SITE_OTHER): Payer: Medicare HMO | Admitting: Psychiatry

## 2023-05-07 DIAGNOSIS — F314 Bipolar disorder, current episode depressed, severe, without psychotic features: Secondary | ICD-10-CM

## 2023-05-07 DIAGNOSIS — F411 Generalized anxiety disorder: Secondary | ICD-10-CM

## 2023-05-07 DIAGNOSIS — F3162 Bipolar disorder, current episode mixed, moderate: Secondary | ICD-10-CM

## 2023-05-07 DIAGNOSIS — G3184 Mild cognitive impairment, so stated: Secondary | ICD-10-CM | POA: Diagnosis not present

## 2023-05-07 DIAGNOSIS — F900 Attention-deficit hyperactivity disorder, predominantly inattentive type: Secondary | ICD-10-CM

## 2023-05-07 MED ORDER — LAMOTRIGINE 100 MG PO TABS
ORAL_TABLET | ORAL | 1 refills | Status: DC
Start: 2023-05-07 — End: 2023-11-06

## 2023-05-07 MED ORDER — LORAZEPAM 1 MG PO TABS: 1.0000 mg | ORAL_TABLET | Freq: Three times a day (TID) | ORAL | 5 refills | Status: DC | PRN

## 2023-05-07 MED ORDER — QUETIAPINE FUMARATE 300 MG PO TABS
450.0000 mg | ORAL_TABLET | Freq: Every day | ORAL | 1 refills | Status: DC
Start: 2023-05-07 — End: 2023-07-03

## 2023-05-07 NOTE — Progress Notes (Signed)
------------------------------- [id number] [birth year] 67 y.o.  Virtual Visit via Telephone Note  I connected with pt by telephone and verified that I am speaking with the correct person using two identifiers.   I discussed the limitations, risks, security and privacy concerns of performing an evaluation and management service by telephone and the availability of in person appointments. I also discussed with the patient that there may be a patient responsible charge related to this service. The patient expressed understanding and agreed to proceed.  I discussed the assessment and treatment plan with the patient. The patient was provided an opportunity to ask questions and all were answered. The patient agreed with the plan and demonstrated an understanding of the instructions.   The patient was advised to call back or seek an in-person evaluation if the symptoms worsen or if the condition fails to improve as anticipated.  I provided 20 minutes of non-face-to-face time during this encounter. The patient was located at home and the provider was located office. Session from 330-400  Subjective:   Patient ID:  Reginald Tucker is a 67 y.o. (DOB 02-24-1956) male.  Chief Complaint:  Chief Complaint  Patient presents with   Follow-up   Depression   Anxiety   Sleeping Problem    Anxiety Symptoms include nervous/anxious behavior and shortness of breath. Patient reports no chest pain, confusion, decreased concentration or suicidal ideas.    Depression        Associated symptoms include no decreased concentration, no fatigue and no suicidal ideas.  Past medical history includes anxiety.    [name] presents to the office today for follow-up of TRD and anxiety.  When seen April 06, 2019.  He had not seen any mood benefit from low-dose pramipexole and had side effects at higher dosages.  Therefore we weaned him off of the that medication.  visit August 07, 2019 and the following  changes were made: Option retry Vraylar with selegiline which wasn't adequately done before.  Prior trial inadequate duration and SE Retry Vraylar at a lower dosage to prevent tremors: 1 capsule every Monday, Wednesday, Friday only. Made him jittery and stopped after a week.  Didn't see mood benefit.  October 2020 was last appointment and there were no med changes as the patient was going to seek a second opinion.  The following was noted: Still irritable and real tired.  Easily fatigued with normal activity.  Appt with PCP Oct 27 to evaluate. Not much tremor. Waves of depression worse in the morning.  Sleeping too much in recliner and lethargic.  Deep sleep.  Takes 60 min to fall asleep.  Cant' shut off his brain and hard to fall asleep.  Reads a lot and enjoys it especially history.  Pt reports that mood is Anxious, Depressed and Irritable rated 7/10 bad and describes anxiety as Moderate and occassional.  Less anxiety this visit than depression..  Staying in the house is making it worse too.   Anxiety symptoms include: Excessive Worry, Panic Symptoms, Social Anxiety,. Has claustrophobia.  Hard to ride elevators .  Can't go up the steps here.  Pt reports no sleep issues. Pt reports that appetite is good. Pt reports that energy is poor and anhedonia, loss of interest or pleasure in usual activities, poor motivation and withdrawn from usual activities. Concentration is down slightly. Suicidal thoughts:  denied by patient.  Still has days that does very little.  Will clean occ.   10/17/2020 appointment with the following noted:  Wife joined  Patient has requested refills from this location but has not been seen here in a year which is not appropriate given his level of instability historically. Didn't go for second opinion since here.   Still doing about the same without much change.  Some days are better than others. Wife says he does some things and will go out sometimes.  Doesn't want to go out at  dark.  Thinks winter is the worst time. Asked about TMS. Tolerating meds OK.   Chronically depressed and easily irritable, without pattern except brief. Wife says some weeks are worse than others. No walking or exercise.  Balance issues.  Was better when did PT but he quit the exercises. Patient is highly claustrophobic and states is getting worse.  He is having trouble using the elevator to get to our office.  He asked about finding another psychiatrist who has an office on the first floor and we discussed options.  Normal card review recently.  02/21/21 appt noted: W involved in call. Very sick with covid pneumonia and nearly died in Jan 10, 2023 and still recovering. Doing breathing exercises and some walking.  Weak in am. A lot of depression in [facility] thinking he would die.  Now dep 7/10.   No change in mood off selegiline per pt or [name] but she's not sure. Concerta didn't help and they agree.  He stopped it and went back on selegiline. Memory is bad. Taking melatonin 10 comes and goes with benefit.  Takes 1 and 1/2 hours to go to sleep. To bed 1110 and up at 9-930 Plan: DC selegiline   Wait 5 days to clear and start [facility] ER 36 mg each AM.   If NR after 2 weeks, then ER increase if they call.  03/29/2021 phone call from wife reporting that Caplyta was working and they wanted more samples while we worked on a PA  04/24/2021 appointment with the following noted:  Wife on session also Took [facility] and couldn't tell a difference except wife said he was more anxious.  Stopped it. Caplyta 42 mg daily and now says he's worse.  Not doing well at all.  Wonders if he's worse bc of the reduction in Seroquel.  Going through hell for 3-4 weeks with 8/10 depression.  No SI. Getting up earlier. More anxiety and hard to lay down. Came to a head with wife saying she's leaving for 24 days to help her daughter with a child.  She was gone 10 days in [facility].  Inconsistency in life.  Better if going to church but usually  doesn't do it. Got saved my life from Covid Plan: DC Caplyta DT failure. Return to Seroquel 400 bc worse without it. He wants to use risperidone 1 mg BID prn anxiety.  06/21/2021 appointment with the following noted:  Wife on call Only increased Seroquel to 300 mg HS. Doing great and about 3/10 depression.  Started improving pretty quickly.  Went back to church and out with friends.  W gone 3 weeks.  Going to Bible study.  Got together with friends he hasn't seen  in 4 years. I feel good. No SE. Sleep good usually.  Not sleeping excessively as much as in the past about 8-9 hours.  Anxiety is etter not gone.   Trying to walk without a cane.  Recent PCP was unremarkable.  Lost to 218#.    09/26/21 appt noted: wife also on call Great.  Gym 5-6 days per week. Lost 25#.  Getting out more.  Involved in church.  Feels better than last time and more active and socializing.  Mostly out of depression 1-2/10. No SE.  Better with queiapine 300 vs 400 re: sedation. Wife agrees he's dramatically better all around. Needs and gets 8-9 hours nightly and 2 brief naps daily. Anxiety is manageable and risperidone helps.  Risperidone also helps keep down the level of irritability which is occasional and manageable. Plan: Continue Seroquel 300 mg HS Continue risperidone 1 mg twice daily as he is feels it is helping Continue lamotrigine 200 mg twice daily   01/23/22 appt noted: Still doing fine with depression with some seasonal blues.  Involved in couple of Bible studies.  Walking and reading books.  Getting out and doing things.  Men's and couples conferences. [employer]. Does chores.  Good health. Depression 2/10.  Sleep pretty good with occ initial insomnia.  Restorative.   No SE problems Plan: No change indicated.   Continue Seroquel 300 mg HS Continue risperidone 1 mg twice daily as he is feels it is helping Continue lamotrigine 200 mg twice daily  06/26/22 appt noted: I con't have any  depression and anxiety No complaints.  Active at church.   Slowed DT hernia.  Surg 8/4 bc bothers him.  Curtailed physcial activity. No SE. Hard to shut off brain at night.  Reads bible.  Takes awhile to go to sleep.  Chronic problem.  Sleep from 1-8 but would like to sleep more. Plan: No med changes  12/27/2022 appointment noted: CC anxiety more than SE concerns On lamotrigine and Seroquel and lorazepam Ran out of risperidone for a a couple of months had trouble with refill. Wife looked up SE meds. Asks about SE each.  Disc balance issues.  Wife concerned about his memory and balance. Not sedated.  No falls lately.  Is having some memory issues.  STM issues. Everything is ok with quetiapine. Sometimes problems with balance.  Long term issue.  Can sometimes shuffle feet.   Mood for the most part steady with occ flare ups . Anxiety not well controlled.  Taking more lorazepam lately.  Lasts about 8 hours. About 2 daily.  Anxiety picked up 5-6 months ago.  No pressure on him.  No reason for the anxiety. Plan: Continue Seroquel 300 mg HS Continue risperidone 1 mg twice daily as he is feels it is helping Continue lamotrigine 200 mg twice daily Lorazepam 0.5 mg TIDprn is safest option Check B12, folate, serum lamotrigine level Come to office to evaluate balance issue and for EPS  12/31/22 urgent appt noted:  seen with wife, seen in office medS: lamotrigine 200 mg BID, quetiapine  300 mg HS, been off risperidone for several months. 2-3 times a week feels anxiety is out of control and takes prn quetiapine 100 mg with anxiety. CC balance issues.  Not dizzy or lightheadedness.  Occ tremor.  No recent falls Sleep 8-9 hours night and nap but he disputes.  Wife says he lays in bed a couple of times daily.  She says it's more than he does. Irritable 3-4/10.  Not lately depressed.  No other manic sx.  When leaves house very anxious, almost housebound.  Sitts and watches TV.  Will go to grocery.  Not  going to church for months DT anxiety.   Enjoyed family coming to the house.   Hand shaking about a month or so. Sometimes wife says speech mildly slurred.  No jerks.   Redness of face and saw derm and  gave a cream.  He doesn't know the dx. No exercise.   Plan: Continue Seroquel 300 mg HS Reduce lamotrigine 100 mg AM and 200 mg HS to see if balance is better given level of 10.5 Lorazepam 0.5 mg TID prn is safest option Checked B12, folate 12/28/22 were normal.   serum lamotrigine level was 10.5 on 200 bid. TSH low may be cause anxiety.  Send to PCP Tiburcio Pea Come to office to evaluate balance issue and for EPS  03/04/23 appt noted:  Good and more active.  Getting out more. Doesn't feel lorazepam is high enough.  Needs 1 mg in AM and then needs more in the afternoon.Marland Kitchen  a lot going on in the brain with antsy feelings.  Anxiety is not well controlled.    No SE with meds. Balance is better.  No leg jerking.  Sleep is pretty good overall, usually quicker to sleep, more refreshed.  Plan:  Increase Lorazepam 1 mg TID prn is safest option vs switch to clonazepam for unmanaged anxiety  05/07/23 appt noted: Meds: Seroquel 450 HS, lamotrigine 100 AM & 200 PM, lorazepam 1 mg TID. For the most part it is working with more lorazepam.  Some anxiety in the pm. Washed and waxed cars.  More active as much as possible.  Active. No pattern for down days. Sleep unchanged with initial ins.  In bed 10 1/2 hours.   No SE.   Feels blessed.   Satisfied with meds.   Had to put one of the dogs to sleep yesterday.     Sleep study negative for OSA noted on chart.  Past Psychiatric Medication Trials: He has had multiple psych med failures as well .   Past psychiatric medications used include pramipexole NR, sertraline,  Trintellix 1.5 mg MWF jittery,  selegiline max 30mg  daily, Paxil, Wellbutrin, buspirone,   lamotrigine, carbamazepine, lithium with a tremor,  Depakote was side effects of feeling heavy and  increased ammonia,    Seroquel 800 mg a day, Latuda 120 mg a day,  Rexulti,  Perphenazine,  Vraylar 3mg  akathisia, He did not tolerate Vraylar even at 1.5 mg 3 days a week. risperidone, olanzapine, quetiapine Caplyta 42 NR  modafinil,  Adderall with selegiline with BP problems, Concerta 54 NR Light therapy failure. ECT twice, failed 2nd time. Affectively better  after adding pramipexole to 0.5 mg twice daily and increasing Seroquel to 600 mg daily but lost response apparently soon thereafter.  Psychiatric 2nd opinion: Dr. Wellington Hampshire  Review of Systems:  Review of Systems  Constitutional:  Negative for fatigue.  Respiratory:  Positive for shortness of breath.   Cardiovascular:  Negative for chest pain.  Gastrointestinal:  Positive for abdominal pain.  Musculoskeletal:  Positive for back pain.  Psychiatric/Behavioral:  Negative for agitation, behavioral problems, confusion, decreased concentration, dysphoric mood, hallucinations, self-injury, sleep disturbance and suicidal ideas. The patient is nervous/anxious. The patient is not hyperactive.   Occurs 2-3 times/weekre: leg weakness.  Not orthostatic.  Medications: I have reviewed the patient's current medications.  Current Outpatient Medications  Medication Sig Dispense Refill   aspirin EC 81 MG tablet Take 81 mg by mouth daily.     Cholecalciferol (VITAMIN D3) 25 MCG (1000 UT) CAPS Take 1,000 Units by mouth daily.     L-THEANINE PO Take by mouth.     levothyroxine (SYNTHROID) 175 MCG tablet Take 175 mcg by mouth daily before breakfast.     lovastatin (MEVACOR) 40 MG tablet Take 40 mg by mouth daily.  MAGNESIUM PO Take 3 tablets by mouth daily. 3750 mg total     Omega-3 Fatty Acids (SUPER OMEGA 3 PO) Take 1 capsule by mouth daily.     omeprazole (PRILOSEC) 20 MG capsule Take 20 mg by mouth daily.     traMADol (ULTRAM) 50 MG tablet Take 1-2 tablets (50-100 mg total) by mouth every 6 (six) hours as needed for moderate pain or  severe pain. 20 tablet 0   vitamin B-12 (CYANOCOBALAMIN) 50 MCG tablet Take 50 mcg by mouth daily.     zinc gluconate 50 MG tablet Take 50 mg by mouth daily.     lamoTRIgine (LAMICTAL) 100 MG tablet TAKE 1 TABLETS(200 MG) BY MOUTH IN THE MORNING AND 2 TABLETS AT NIGHT. 270 tablet 1   LORazepam (ATIVAN) 1 MG tablet Take 1 tablet (1 mg total) by mouth every 8 (eight) hours as needed for anxiety. 90 tablet 5   QUEtiapine (SEROQUEL) 300 MG tablet Take 1.5 tablets (450 mg total) by mouth at bedtime. 135 tablet 1   No current facility-administered medications for this visit.    Medication Side Effects:got nervous and irritable with 1.5 mg pramipexole in morning, ok with it split up and no change in mood.  Allergies:  Allergies  Allergen Reactions   Lithium     Makes pt jittery/causes anxiety    Ambien [Zolpidem Tartrate] Anxiety    Nervous, uncontrollable   Sulfa Antibiotics Other (See Comments)    UNSPECIFIED REACTION OF CHILDHOOD   Sulfamethoxazole Other (See Comments)    UNSPECIFIED REACTION OF CHILDHOOD    Past Medical History:  Diagnosis Date   Acquired hallux rigidus of right foot 04/26/2017   AKI (acute kidney injury) (HCC) 06/11/2017   Anxiety    Bipolar 1 disorder (HCC)    BMI 37.0-37.9, adult    BPH (benign prostatic hyperplasia)    Cataract    Cataract    L eye   CKD (chronic kidney disease), stage III (HCC)    Colon polyps    COPD GOLD II with restrictive component  01/13/2016   Spirometry 01/13/2016  FEV1 1.84 (47%)  Ratio 62  - 01/13/2016  extensive coaching HFA effectiveness =    90% > try stiolto respimat 2 pffs each am > did not benefit so stopped when sample out - 01/13/2016  Walked RA x 3 laps @ 185 ft each stopped due to  End of study, nl pace, no desat  / min sob  - PFT's  03/16/2016  FEV1 2.28 (59 % ) ratio 67  p 12 % improvement from saba p no prior to study with DLCO  66 % corrects to 86 % for alv volume      Depression    Dysrhythmia    Essential hypertension 01/19/2015    Family history of coronary arteriosclerosis 01/19/2015   Father with MI    Fatty liver    GERD (gastroesophageal reflux disease) 08/11/2014   History of colon polyps 06/18/2017   Hyperlipidemia    Hypertension    Hypothyroidism 08/11/2014   Hypothyroidism    Insomnia 06/18/2017   Insomnia    Memory change    Mixed hyperlipidemia 06/18/2017   Morbid obesity (HCC) 03/18/2016   Complicated by HBP/ Low erv on pfts 03/16/2016 (31%)     Morbid obesity due to excess calories (HCC)    Prediabetes    Sleep apnea    SOB (shortness of breath)    Thyroid disease    Tinnitus of both ears  06/18/2017   Tobacco use 06/18/2017   Unsteadiness on feet    Vitamin D deficiency 06/18/2017   Vitamin D deficiency     Family History  Problem Relation Age of Onset   Cancer Mother    Hyperlipidemia Father    Hypertension Father    CAD Father    Stroke Father    Aortic aneurysm Father    Prostate cancer Father    Hyperlipidemia Brother    Appendicitis Maternal Grandfather     Social History   Socioeconomic History   Marital status: Married    Spouse name: Not on file   Number of children: Not on file   Years of education: Not on file   Highest education level: Associate degree: occupational, Scientist, product/process development, or vocational program  Occupational History   Occupation: act.  assist  Tobacco Use   Smoking status: Former    Packs/day: 1.00    Years: 25.00    Additional pack years: 0.00    Total pack years: 25.00    Types: Cigarettes    Quit date: 12/10/2000    Years since quitting: 22.4   Smokeless tobacco: Never   Tobacco comments:    heavy vape user- quit vaping 06/04/2017  Vaping Use   Vaping Use: Former  Substance and Sexual Activity   Alcohol use: No    Alcohol/week: 0.0 standard drinks of alcohol   Drug use: No   Sexual activity: Not on file  Other Topics Concern   Not on file  Social History Narrative   Admitted to [facility] 06/14/17- discharged   Lives at home with his wife   Married -  [name]   Former smoker - stopped 2002   Alcohol none   Full code   Right handed   Drinks 2 cups of caffeine daily   Social Determinants of Corporate investment banker Strain: Not on file  Food Insecurity: Not on file  Transportation Needs: Not on file  Physical Activity: Not on file  Stress: Not on file  Social Connections: Not on file  Intimate Partner Violence: Not on file    Past Medical History, Surgical history, Social history, and Family history were reviewed and updated as appropriate.   Please see review of systems for further details on the patient's review from today.   Objective:   Physical Exam:  There were no vitals taken for this visit.  Physical Exam Neurological:     Mental Status: He is alert and oriented to person, place, and time.     Cranial Nerves: No dysarthria.  Psychiatric:        Attention and Perception: Attention and perception normal.        Mood and Affect: Mood is anxious.        Speech: Speech normal.        Behavior: Behavior is cooperative.        Thought Content: Thought content normal. Thought content is not paranoid or delusional. Thought content does not include homicidal or suicidal ideation. Thought content does not include suicidal plan.        Cognition and Memory: Cognition and memory normal.        Judgment: Judgment normal.     Comments: Insight intact    Lab Review:     Component Value Date/Time   NA 138 02/21/2022 1054   K 4.8 02/21/2022 1054   CL 104 02/21/2022 1054   CO2 28 02/21/2022 1054   GLUCOSE 118 (H) 02/21/2022 1054  BUN 17 02/21/2022 1054   CREATININE 1.07 02/21/2022 1054   CALCIUM 9.5 02/21/2022 1054   PROT 6.9 02/11/2018 0623   ALBUMIN 4.2 02/11/2018 0623   AST 25 02/11/2018 0623   ALT 33 02/11/2018 0623   ALKPHOS 89 02/11/2018 0623   BILITOT 0.9 02/11/2018 0623   GFRNONAA >60 02/21/2022 1054   GFRAA >60 08/13/2018 1020       Component Value Date/Time   WBC 5.5 02/21/2022 1054   RBC 5.34  02/21/2022 1054   HGB 16.7 02/21/2022 1054   HCT 50.7 02/21/2022 1054   PLT 204 02/21/2022 1054   MCV 94.9 02/21/2022 1054   MCH 31.3 02/21/2022 1054   MCHC 32.9 02/21/2022 1054   RDW 12.4 02/21/2022 1054   LYMPHSABS 1.3 08/13/2018 1020   MONOABS 0.7 08/13/2018 1020   EOSABS 0.1 08/13/2018 1020   BASOSABS 0.1 08/13/2018 1020    No results found for: "POCLITH", "LITHIUM"   No results found for: "PHENYTOIN", "PHENOBARB", "VALPROATE", "CBMZ"   12/28/22      Component Ref Range & Units 3 d ago  Lamotrigine Lvl 2.5 - 15.0 mcg/mL 10.8    On 200 mg BID    .res Assessment: Plan:    Severe bipolar I disorder with depression (HCC) - Plan: QUEtiapine (SEROQUEL) 300 MG tablet  Generalized anxiety disorder - Plan: LORazepam (ATIVAN) 1 MG tablet  Mild cognitive impairment  Attention deficit hyperactivity disorder (ADHD), predominantly inattentive type  Bipolar 1 disorder, mixed, moderate (HCC) - Plan: lamoTRIgine (LAMICTAL) 100 MG tablet    Severe TRD and anxiety.   Seasonal pattern of depression in bipolar is not uncommon   Disc history of TRD.  He is markedly better at this point on quetiapine 300 mg nightly and lamotrigine 300 mg daily  Anxiety is improved not resolved with lorazepam 1 mg TID  Also we discussed at length that the increased and his physical and social activity and spiritual activity is clearly having a marked positive effect on his mood as well.  It is unlikely for medication alone to maintain him with regard to his mood.  He has a history of multiple recurrences and very unstable bipolar disorder.  He agrees to try to keep continue his positive social and physical behaviors.  He is also added gym 5 days a week which is clearly helping.  Disc each med and SE issues.  Disc purpose of each.  There is no way to treat his history of psych problems without meds that have potential SE in these areas.   Disc balancing benefit / SE ratio with each med.  chronic  balance issues.  Dr. Lucia Gaskins evaluated him for this in the past 02/2018 without etiology defined.  Had this problem for years.  Continue Seroquel 300 -450 mg HS  Better with Reduced lamotrigine 100 mg AM and 200 mg HS to see if balance is better given level of 10.5 on 400 mg daily.  Better with Increase Lorazepam 1 mg TID prn is safest option with better managed anxiety  Checked B12, folate 12/28/22 were normal.   serum lamotrigine level was 10.5 on 200 bid. TSH low may be cause anxiety.  Send to PCP [facility]  Discussed potential metabolic side effects associated with atypical antipsychotics, as well as potential risk for movement side effects. Advised pt to contact office if movement side effects occur.   We discussed the short-term risks associated with benzodiazepines including sedation and increased fall risk among others.  Discussed long-term side effect  risk including dependence, potential withdrawal symptoms, and the potential eventual dose-related risk of dementia.  But recent studies from 2020 dispute this association between benzodiazepines and dementia risk. Newer studies in 2020 do not support an association with dementia.  FU 6 mos  Meredith Staggers, MD, DFAPA    Future Appointments  Date Time Provider Department Center  05/15/2023  2:30 PM Anson Fret, MD [facility] None      No orders of the defined types were placed in this encounter.      -------------------------------

## 2023-05-15 ENCOUNTER — Ambulatory Visit (INDEPENDENT_AMBULATORY_CARE_PROVIDER_SITE_OTHER): Payer: Medicare HMO | Admitting: Neurology

## 2023-05-15 VITALS — BP 131/88 | HR 84 | Ht 72.0 in | Wt 211.0 lb

## 2023-05-15 DIAGNOSIS — R2689 Other abnormalities of gait and mobility: Secondary | ICD-10-CM

## 2023-05-15 DIAGNOSIS — G20C Parkinsonism, unspecified: Secondary | ICD-10-CM

## 2023-05-15 DIAGNOSIS — R269 Unspecified abnormalities of gait and mobility: Secondary | ICD-10-CM

## 2023-05-15 DIAGNOSIS — W19XXXA Unspecified fall, initial encounter: Secondary | ICD-10-CM

## 2023-05-15 NOTE — Progress Notes (Unsigned)
[facility]    Provider:  Dr Lucia Gaskins Referring Provider: Johny Blamer, MD Primary Care Physician:  Johny Blamer, MD, [facility]  CC:  Imbalance, parkinsonism, hx of dopa blocking drugs  05/15/2023: Reginald Tucker is a 67 y.o. male here as a referral from Dr. Tiburcio Pea . has Family history of coronary arteriosclerosis; Essential hypertension; COPD GOLD II with restrictive component ; Morbid obesity (HCC); Acquired hallux rigidus of right foot; AKI (acute kidney injury) (HCC); Bipolar 1 disorder (HCC); BPH (benign prostatic hyperplasia); GERD (gastroesophageal reflux disease); History of colon polyps; Hypothyroidism; Insomnia; Mixed hyperlipidemia; Shortness of breath; Tinnitus of both ears; Tobacco use; Vitamin D deficiency; H/O toe surgery; Acute renal failure superimposed on stage 3 chronic kidney disease (HCC); Hyperkalemia; Falls; H/O umbilical hernia repair; GAD (generalized anxiety disorder); ADD (attention deficit disorder); and Serrated polyp of appendix s/p lap appendectomy/partial cecectomy 03/02/2022 on their problem list.  Reviewed my notes and EPIC chart: we saw him in 2019 for the similar chief complaint. He was on polypharmacy and we thought this could be contributory, his neuro exam was normal. Extensive testing was performed.  EMG nerve conduction study on the bilateral lower extremities was normal, no suggestion of neuromuscular disorders, mononeuropathy, polyneuropathy or radiculopathy.  MRI was overall unremarkable there was a chronic microhemorrhage in the lateral right thalamus and minimal to mild for age white matter changes likely chronic microvascular ischemia, MRI of the cervical spine showed multilevel degenerative changes and foraminal stenosis however no central canal stenosis, normal cervical cord,   I reviewed Dr. Johnathan Hausen notes, he is on B12, vitamin K, risperidone, zinc, vitamin D3, levothyroxine, magnesium, aspirin, statin, magnesium,  lorazepam, Lamictal for hypothyroidism, bipolar disorder, hyperlipidemia, vitamin D deficiency, he also has insomnia, GERD, hyperlipidemia, COPD, essential hypertension, tinnitus, morbid severe obesity, frequent PVCs, imbalance, memory changes, prediabetes and chronic kidney stage III.  Dr. Tiburcio Pea notes that he has a normal Romberg but his gait is shuffling and he does have mild tremor of the right hand and continues to have problems with his balance and has had falls, his wife also noted a tremor in his right hand, he is recovering from surgeries last year and he still has a lot of anxiety, he continues to follow-up with psychiatry and he has bipolar disorder on Lamictal, Seroquel, risperidone, lorazepam which could cause parkinsonian symptoms.  Hemoglobin A1c was 5.4 and Dr. Tiburcio Pea noted trembles in both hands  He has been falling. Imbalance. Shuffling. Stopped the risperdal but was on it and seroquel. He lowered the lamictal and no twitching and wobbliness anymore. Better since lower dose of lamictal. Stopping the risperdal did not help. Balance is worse. He has lost a lot of weight. Wife ishere and provides information. He has a tremor at rest and when holding hands. No neck pain or low back pain. No numbness or tingling. No signifcant weakness but imbalance, can walk in a straight ling but can't tndem. Wife provides much information, poor short-term memory. Wife feels his voice has become softer. Has a hard time getting words out pronouncing words when he reads. No vivid dreams or acting out dreams. He was tested for sleep apnea and negative. Takes a lot of naps. Smell and taste are good. No difficulty swallowing. Handwriting crappy but the same. No hallucinations.    12/28/2022 Lamictal level 10.8 B12 and folate normal TSH .01  02/11/2018: EXAM: MRI HEAD WITHOUT CONTRAST   MRI CERVICAL SPINE WITHOUT CONTRAST   MRI LUMBAR SPINE WITHOUT CONTRAST  TECHNIQUE: Multiplanar, multiecho pulse sequences  of the brain and surrounding structures, cervical spine, and lumbar spine were obtained without intravenous contrast.   COMPARISON:  Orbit radiographs 08/08/2017. Lumbar spine radiographs 01/16/2018. CT Abdomen 11/13/2017.   FINDINGS: MRI HEAD FINDINGS   Brain: Cerebral volume is within normal limits for age. No restricted diffusion to suggest acute infarction. No midline shift, mass effect, evidence of mass lesion, ventriculomegaly, extra-axial collection or acute intracranial hemorrhage. Cervicomedullary junction and pituitary are within normal limits.   There is a chronic microhemorrhage in the lateral right thalamus on series 8, image 50. There may be additional chronic micro hemorrhages in the bilateral basal ganglia on that series, but otherwise no additional chronic cerebral blood products are identified. Superimposed minimal to mild for age scattered small nonspecific cerebral white matter T2 and FLAIR hyperintense foci, mostly in the anterior frontal lobes as on series 7, image 19. No cortical encephalomalacia. The brainstem and cerebellum are normal.   Vascular: Major intracranial vascular flow voids are preserved. Mild tortuosity of the distal right vertebral artery.   Skull and upper cervical spine: Cervical spine is detailed below. Normal bone marrow signal.   Sinuses/Orbits: Normal orbits soft tissues. There is a 2 cm mucous retention cyst in the right maxillary sinus, the other paranasal sinuses are clear.   Other: Small volume of fluid layering in the pharynx related to general anesthesia. Mastoids are clear. Visible internal auditory structures appear normal. Scalp and face soft tissues appear negative.   MRI CERVICAL SPINE FINDINGS   Alignment: Straightening of lower cervical lordosis. 1-2 millimeters of anterolisthesis of C4 on C5.   Vertebrae: Evidence of bilateral C4-C5 posterior element ankylosis. Chronic degenerative marrow signal changes in those  facets and elsewhere in the cervical spine, especially the lower cervical endplates. No marrow edema or evidence of acute osseous abnormality.   Cord: Capacious spinal canal. Spinal cord signal is within normal limits at all visualized levels.   Posterior Fossa, vertebral arteries, paraspinal tissues: Intubated with associated small volume of fluid in the pharynx. Major vascular flow voids in the neck are preserved. Negative bilateral neck soft tissues.   Disc levels:   C2-C3: Right facet joint ankylosis is evident. There is mild to moderate right facet hypertrophy. Mild left facet hypertrophy. No spinal stenosis. There is mild left C3 foraminal stenosis.   C3-C4: Severe bilateral facet hypertrophy. Degenerative subchondral cysts suspected on the left. Negative disc but mild endplate spurring. Severe left and mild to moderate right C4 foraminal stenosis primarily due to the posterior element disease.   C4-C5: Trace anterolisthesis with bilateral facet ankylosis and hypertrophy. Negative disc. Borderline to mild bilateral C5 foraminal stenosis related to the facet enlargement.   C5-C6: Disc space loss. Bulky circumferential disc osteophyte complex, but mostly directed anteriorly. Broad-based posterior and biforaminal components. Mild ligament flavum and facet hypertrophy. No spinal stenosis. Moderate to severe bilateral C6 foraminal stenosis.   C6-C7: Disc space loss. Bulky circumferential disc osteophyte complex, mostly directed anteriorly. Broad-based posterior and left greater than right foraminal component. Mild facet and ligament flavum hypertrophy. No spinal stenosis. Moderate to severe left and mild right C7 neural foraminal stenosis.   C7-T1: Disc space loss. Bulky circumferential disc osteophyte complex with broad-based posterior and biforaminal components. No spinal stenosis. Moderate to severe left and mild to moderate right C8 neural foraminal stenosis.   T1-T2:  Negative.  No upper thoracic spinal stenosis.   MRI LUMBAR SPINE FINDINGS   Segmentation:  Normal on the comparisons.  Alignment:  Stable and normal lumbar vertebral height and alignment.   Vertebrae: Visualized bone marrow signal is within normal limits. No marrow edema or evidence of acute osseous abnormality.   Spinal cord/conus: The conus medullaris terminates above the mid T12 level. The cauda equina nerve roots appear normal.   Paraspinal and other soft tissues: Stable abdominal viscera compared to the December 2018 abdomen CT. Multiple benign appearing renal cysts, larger and more numerous on the right. Nonspecific bilateral perinephric stranding. Diverticulosis of the large bowel. Negative visualized posterior paraspinal soft tissues.   Disc levels:   T12-L1:  Negative.   L1-L2:  Negative.   L2-L3: Disc space loss. Circumferential disc bulge with endplate spurring. Broad-based bilateral foraminal involvement. Mild facet and ligament flavum hypertrophy. No spinal or convincing lateral recess stenosis. Borderline to mild right L2 foraminal stenosis.   L3-L4: Mild far lateral disc bulging and endplate spurring. Minimal to mild facet hypertrophy. No significant stenosis.   L4-L5: Disc desiccation. Mild circumferential disc bulge. Small central to slightly left paracentral annular fissure of the disc (series 22, image 32). Mild to moderate facet hypertrophy. No stenosis.   L5-S1: Far lateral disc bulging and endplate spurring greater on the right. Subsequent mild right L5 neural foraminal stenosis.   IMPRESSION: BRAIN:   1.  No acute intracranial abnormality.   2. Evidence of chronic small vessel disease in the form of several chronic micro hemorrhages in the deep gray matter nuclei, but otherwise largely unremarkable for age noncontrast MRI appearance of the brain.   CERVICAL SPINE:   1. Widespread advanced cervical spine degeneration, although capacious  spinal canal without spinal stenosis. Normal cervical and visible upper thoracic spinal cord. 2. Key degenerative features include: -Posterior element ankylosis at C2-C3 and C4-C5 with intervening severe facet degeneration at C3-C4. -advanced chronic cervical disc and endplate degeneration C5-C6 through C7-T1. 3. Up to severe subsequent cervical neural foraminal stenosis at the left C4, bilateral C6, left C7, and left C8 nerve levels.   LUMBAR SPINE:   1. Comparatively little lumbar spine degeneration. No lumbar spinal or lateral recess stenosis. 2. Up to mild multifactorial neural foraminal stenosis at the right L2 and right L5 nerve levels. 3. Stable visible abdomen compared to CT Abdomen 11/13/2017  we saw him in 2019 for the similar chief complaint. He was on polypharmacy and we thought this could be contributory, his neuro exam was normal. Extensive testing was performed.  EMG nerve conduction study on the bilateral lower extremities was normal, no suggestion of neuromuscular disorders, mononeuropathy, polyneuropathy or radiculopathy.  MRI was overall unremarkable there was a chronic microhemorrhage in the lateral right thalamus and minimal to mild for age white matter changes likely chronic microvascular ischemia, MRI of the cervical spine showed multilevel degenerative changes and foraminal stenosis however no central canal stenosis, normal cervical cord,   I reviewed Dr. Johnathan Hausen notes, he is on B12, vitamin K, risperidone, zinc, vitamin D3, levothyroxine, magnesium, aspirin, statin, magnesium, lorazepam, Lamictal for hypothyroidism, bipolar disorder, hyperlipidemia, vitamin D deficiency, he also has insomnia, GERD, hyperlipidemia, COPD, essential hypertension, tinnitus, morbid severe obesity, frequent PVCs, imbalance, memory changes, prediabetes and chronic kidney stage III.  Dr. Tiburcio Pea notes that he has a normal Romberg but his gait is shuffling and he does have mild tremor of the right  hand and continues to have problems with his balance and has had falls, his wife also noted a tremor in his right hand, he is recovering from surgeries last year and he still  has a lot of anxiety, he continues to follow-up with psychiatry and he has bipolar disorder on Lamictal, Seroquel, risperidone, lorazepam which could cause parkinsonian symptoms.  Hemoglobin A1c was 5.4 and Dr. Tiburcio Pea noted trembles in both hands  Patient complains of symptoms per HPI as well as the following symptoms: imbalance . Pertinent negatives and positives per HPI. All others negative   Interval history 02/20/2018: Patient is here for follow-up on leg weakness, falls, imbalance and a new problem twitching in his legs and memory loss.  Extensive testing was performed.  EMG nerve conduction study on the bilateral lower extremities was normal, no suggestion of neuromuscular disorders, mononeuropathy, polyneuropathy or radiculopathy.  MRI was overall unremarkable there was a chronic microhemorrhage in the lateral right thalamus and minimal to mild for age white matter changes likely chronic microvascular ischemia, MRI of the cervical spine showed multilevel degenerative changes and foraminal stenosis however no central canal stenosis, normal cervical cord,  Reviewed all images of MRI and showed them to patient and answered questions.  I did not order MRI of the lumbar spine and that will be discussed with ordering physician and patient however no significant foraminal stenosis or central canal stenosis or etiology for leg symptoms.  Today we will order some lab tests. Wife provides much information.   HPI:  Reginald Tucker is a 67 y.o. male here as a referral from Dr. Tiburcio Pea for imbalance. PMHx bipolar disorder on multiple medications, hypertension, hypothyroidism, insomnia, COPD, tinnitus, hyperlipidemia, memory changes, morbid obesity, prediabetes, hyperlipidemia, stage III chronic kidney disease, unsteadiness on feet. He started  stumbling a few years ago with random falls. Progressively worsened. Patient here with wife who provides information. His legs feel like Jelly and he has fallen.He reports tremors. His legs feel like jelly and then collapse. He was going down the steps and his legs felt like jelly. He has had multiple falls. Weakness of legs, lasts briefly but can have weakness that continues. Sense of smell is fine. Difficulty picking legs up but that has been ongoing since the 90s for decades, not new. No changes in voice volume, expressions of the face, no REM sleep disorder.  Tremors are sporadic, not today, but happens a lot on the morning, lasts an hour or two in the morning before eating. No neck pain, no weakness or symptoms in the arms, all lower extremity.No walking or exercises. Denies sensory changes in the feet or neuropathy signs/symptoms.  Reviewed notes, labs and imaging from outside physicians, which showed:   Reviewed referring physician notes.  Legs giving out.  He was at [facility] and he felt like his legs were like jelly and when he got up from a CT fell backwards striking the back of his head on his left occipital region on the table leg.  He did not lose consciousness.  He has not had any subsequent nausea or vomiting.  His wife reports that he seems to not lift up his feet when he walks which may be contributing to his recent fall stumble.  He apparently had 3 falls after the snowstorm in December 2018.  He is on a number of mood stabilizing medicines prescribed by his psychiatrist, Dr. Haywood Lasso.  They discussed his symptoms with psychiatry last week but psychiatry did not feel that his mood stabilizing medications were likely the cause of his current symptoms from a side effect standpoint.  He has not had any recent dose adjustments or new medications from a mood stabilizing standpoint.  He has  episodes where he has dragged his feet on previously over the years and they have apparently commented on this on  fall risk assessment.  Patient was referred for physical therapy to help with decreasing risk for falling in June 2018 S3 falls were documented on fall risk assessment at that time he did not attend.  His most recent lab work including CMP and TSH were unremarkable earlier this month December 2018. TSh was normal.   FINDINGS: Lumbar alignment within normal limits. Vertebral body heights are normal. Mild to moderate disc space narrowing at L2-L3, L3-L4 and L5-S1 with anterior osteophytes. Posterior facet disease of the lower lumbar spine.   IMPRESSION: 1. Mild-to-moderate degenerative changes 2. No acute osseous abnormality.    CMP 24, creatinine 1.34 11/28/2017   Review of Systems: Patient complains of symptoms per HPI as well as the following symptoms: Weight loss, fatigue, memory loss, weakness, sleepiness, snoring, hearing loss, ringing in ears, depression, too much sleep, not enough sleep, decreased energy, disinterest in activities. Pertinent negatives and positives per HPI. All others negative.   Social History   Socioeconomic History   Marital status: Married    Spouse name: Not on file   Number of children: Not on file   Years of education: Not on file   Highest education level: Associate degree: occupational, Scientist, product/process development, or vocational program  Occupational History   Occupation: act.  assist  Tobacco Use   Smoking status: Former    Packs/day: 1.00    Years: 25.00    Additional pack years: 0.00    Total pack years: 25.00    Types: Cigarettes    Quit date: 12/10/2000    Years since quitting: 22.4   Smokeless tobacco: Never   Tobacco comments:    heavy vape user- quit vaping 06/04/2017  Vaping Use   Vaping Use: Former  Substance and Sexual Activity   Alcohol use: No    Alcohol/week: 0.0 standard drinks of alcohol   Drug use: No   Sexual activity: Not on file  Other Topics Concern   Not on file  Social History Narrative   Admitted to [facility] 06/14/17- discharged    Lives at home with his wife   Married - [name]   Former smoker - stopped 2002   Alcohol none   Full code   Right handed   Drinks 2 cups of caffeine daily   Social Determinants of Corporate investment banker Strain: Not on file  Food Insecurity: Not on file  Transportation Needs: Not on file  Physical Activity: Not on file  Stress: Not on file  Social Connections: Not on file  Intimate Partner Violence: Not on file    Family History  Problem Relation Age of Onset   Cancer Mother    Hyperlipidemia Father    Hypertension Father    CAD Father    Stroke Father    Aortic aneurysm Father    Prostate cancer Father    Hyperlipidemia Brother    Appendicitis Maternal Grandfather     Past Medical History:  Diagnosis Date   Acquired hallux rigidus of right foot 04/26/2017   AKI (acute kidney injury) (HCC) 06/11/2017   Anxiety    Bipolar 1 disorder (HCC)    BMI 37.0-37.9, adult    BPH (benign prostatic hyperplasia)    Cataract    Cataract    L eye   CKD (chronic kidney disease), stage III (HCC)    Colon polyps    COPD GOLD II  with restrictive component  01/13/2016   Spirometry 01/13/2016  FEV1 1.84 (47%)  Ratio 62  - 01/13/2016  extensive coaching HFA effectiveness =    90% > try stiolto respimat 2 pffs each am > did not benefit so stopped when sample out - 01/13/2016  Walked RA x 3 laps @ 185 ft each stopped due to  End of study, nl pace, no desat  / min sob  - PFT's  03/16/2016  FEV1 2.28 (59 % ) ratio 67  p 12 % improvement from saba p no prior to study with DLCO  66 % corrects to 86 % for alv volume      Depression    Dysrhythmia    Essential hypertension 01/19/2015   Family history of coronary arteriosclerosis 01/19/2015   Father with MI    Fatty liver    GERD (gastroesophageal reflux disease) 08/11/2014   History of colon polyps 06/18/2017   Hyperlipidemia    Hypertension    Hypothyroidism 08/11/2014   Hypothyroidism    Insomnia 06/18/2017   Insomnia    Memory change    Mixed  hyperlipidemia 06/18/2017   Morbid obesity (HCC) 03/18/2016   Complicated by HBP/ Low erv on pfts 03/16/2016 (31%)     Morbid obesity due to excess calories (HCC)    Prediabetes    Sleep apnea    SOB (shortness of breath)    Thyroid disease    Tinnitus of both ears 06/18/2017   Tobacco use 06/18/2017   Unsteadiness on feet    Vitamin D deficiency 06/18/2017   Vitamin D deficiency     Past Surgical History:  Procedure Laterality Date   COLONOS     COLONSCOPY     CYST EXCISION  12/24/2016   sebaceous cyst chest wall  Dr. Lindie Spruce   LAPAROSCOPIC APPENDECTOMY N/A 03/02/2022   Procedure: LAPAROSCOPIC APPENDECTOMY, PARTIAL RESECTION OF CECIAL WALL;  Surgeon: Karie Soda, MD;  Location: [facility];  Service: General;  Laterality: N/A;   MASS EXCISION Left 12/24/2016   Procedure: EXCISION LEFT CHEST WALL SEBACEOUS CYST;  Surgeon: Jimmye Norman, MD;  Location: [facility];  Service: General;  Laterality: Left;   RADIOLOGY WITH ANESTHESIA N/A 02/11/2018   Procedure: MRI OF BRAIN WITHOUT CONTRAST AND LUMBER SPINE AND CERVICAL;  Surgeon: Radiologist, Medication, MD;  Location: [facility];  Service: Radiology;  Laterality: N/A;   root tip removal     TOE FUSION Right 2018   UMBILICAL HERNIA REPAIR N/A 08/18/2018   Procedure: Umbilical Hernia Repair;  Surgeon: Jimmye Norman, MD;  Location: [facility];  Service: General;  Laterality: N/A;   WISDOM TOOTH EXTRACTION      Current Outpatient Medications  Medication Sig Dispense Refill   ascorbic acid (VITAMIN C) 1000 MG tablet Take 1,000 mg by mouth daily.     aspirin EC 81 MG tablet Take 81 mg by mouth daily.     Cholecalciferol (VITAMIN D3) 25 MCG (1000 UT) CAPS Take 1,000 Units by mouth daily.     L-THEANINE PO Take by mouth.     lamoTRIgine (LAMICTAL) 100 MG tablet TAKE 1 TABLETS(200 MG) BY MOUTH IN THE MORNING AND 2 TABLETS AT NIGHT. 270 tablet 1   levothyroxine (SYNTHROID) 175 MCG tablet Take 175 mcg by mouth daily before breakfast.     LORazepam  (ATIVAN) 1 MG tablet Take 1 tablet (1 mg total) by mouth every 8 (eight) hours as needed for anxiety. 90 tablet 5   lovastatin (MEVACOR) 40 MG tablet  Take 40 mg by mouth daily.      MAGNESIUM PO Take 3 tablets by mouth daily. 3750 mg total     Omega-3 Fatty Acids (SUPER OMEGA 3 PO) Take 1 capsule by mouth daily.     omeprazole (PRILOSEC) 20 MG capsule Take 20 mg by mouth daily.     QUEtiapine (SEROQUEL) 300 MG tablet Take 1.5 tablets (450 mg total) by mouth at bedtime. 135 tablet 1   vitamin B-12 (CYANOCOBALAMIN) 50 MCG tablet Take 50 mcg by mouth daily.     zinc gluconate 50 MG tablet Take 50 mg by mouth daily.     No current facility-administered medications for this visit.    Allergies as of 05/15/2023 - Review Complete 05/15/2023  Allergen Reaction Noted   Lithium  02/16/2022   Ambien [zolpidem tartrate] Anxiety 01/19/2015   Sulfa antibiotics Other (See Comments) 06/10/2017   Sulfamethoxazole Other (See Comments) 01/19/2015    Vitals: BP 131/88 (BP Location: Left Arm, Patient Position: Sitting, Cuff Size: Normal)   Pulse 84   Ht 6' (1.829 m)   Wt 211 lb (95.7 kg)   BMI 28.62 kg/m  Last Weight:  Wt Readings from Last 1 Encounters:  05/15/23 211 lb (95.7 kg)   Last Height:   Ht Readings from Last 1 Encounters:  05/15/23 6' (1.829 m)  Physical exam: Exam: Gen: NAD, conversant, well nourised,  well groomed                     CV: RRR, no MRG. No Carotid Bruits. No peripheral edema, warm, nontender Eyes: Conjunctivae clear without exudates or hemorrhage  Neuro: Detailed Neurologic Exam  Speech:    Speech is normal; fluent and spontaneous with normal comprehension.  Cognition:    The patient is oriented to person, place, and time;     recent and remote memory intact;     language fluent;     normal attention, concentration,     fund of knowledge Cranial Nerves:    The pupils are equal, round, and reactive to light. Attempted, pupils too small to visualize. Visual  fields are full to finger confrontation. Extraocular movements are intact. Trigeminal sensation is intact and the muscles of mastication are normal. The face is symmetric. The palate elevates in the midline. Hearing impaired. Voice is hoarse. Shoulder shrug is normal. The tongue has normal motion without fasciculations.   Coordination:    Normal finger to nose and heel to shin.   Gait:    Heel-toe and tandem gait are impaired, cannot tandem. En bloc turning. Decreased arm swing. Not classically shuffling but I suspect its because I was watching. .   Motor Observation:    No asymmetry, no atrophy, and no involuntary movements noted. Tone:    Mild increase in tone  Posture:    Posture is slightly stooped    Strength:    Strength is V/V in the upper and lower limbs.      Sensation: intact to LT     Reflex Exam:  DTR's:    Deep tendon reflexes in the upper and lower extremities are 2+  bilaterally.   Toes:    The toes are downgoing bilaterally.   Clonus:    Clonus is absent.     Assessment/Plan:   67 y.o. male here as a referral from Dr. Tiburcio Pea for imbalance. PMHx bipolar disorder on multiple dopamine-blocking agents for years, hypertension, hypothyroidism, insomnia, COPD, tinnitus, hyperlipidemia, memory changes, morbid obesity, prediabetes, hyperlipidemia, stage  III chronic kidney disease, unsteadiness on feet. Multiple falls, imbalance, gait abnormality, ataxia.   He has extrapyramidal symptoms/parkinsonism unclear if due ot years of dopamine blocking meds or parkinson's disease. Need a DAT scan  Recommend magnesium,zinc,copper and TSH(was 0.1) but they decline, they were able to look online and see TSH 0.614 02/2022, vitamin d 37.7  If DAT scan is negative likely medication related but we can repeat MRi brain and cervical spine.   Extensive testing was performed in past    - EMG nerve conduction study on the bilateral lower extremities was normal, no suggestion of  neuromuscular disorders, mononeuropathy, polyneuropathy or radiculopathy.   - MRI brain was overall unremarkable there was a chronic microhemorrhage in the lateral right thalamus and minimal to mild for age white matter changes likely chronic microvascular ischemia,  - MRI of the cervical spine showed multilevel degenerative changes and foraminal stenosis however no central canal stenosis, normal cervical cord,   - MRI of the lumbar spine no significant foraminal stenosis or central canal stenosis or etiology for leg symptoms.   Orders Placed This Encounter  Procedures   NM BRAIN DATSCAN TUMOR LOC INFLAM SPECT 1 DAY   Basic Metabolic Panel      Cc: Dr. Tiburcio Pea, Dr. Lorrene Reid, MD  [facility] [address] [place], [place] [address]  Phone [phone] Fax [phone]

## 2023-05-15 NOTE — Patient Instructions (Addendum)
Order DaTscan. If negative will order mri brain and cervical spine. If anything is abnormal we will have you back for appointment. Otherwise likely the dopamine-blocking drugs.   Brain DaTscan How to prepare and what to expect What is a brain DaTscan? A brain DaTscan is a nuclear medicine scan. It uses radioactive material to diagnose some diseases of the brain, especially those that cause tremor (shakiness), imbalance DaTscan is a brand name for a drug called ioflupane I-123. A brain DaTscan is a form of radiology, because radiation is used to take pictures of the body. This radioactive drug is ordered especially for you. Because of this, we need at least 72 hours' notice if you must cancel or reschedule your scan.   How does the scan work? You will be given a small dose of tracer (radioactive material) through an intravenous (IV) line. This tracer will collect in part of your brain and give off gamma rays. A special camera called a gamma camera will use these rays to produce pictures and measurements of your brain. How do I prepare? Some drugs will affect the results of your brain DaTscan. You will need to stop taking these drugs before your scan. The table on page 2 lists the drugs that need to be stopped, and for how many days before your scan. This list is in alphabetical order by the generic name of the drug. The common brand names are listed beneath the generic name. Please confirm these instructions with your doctor who prescribed the drug. Drugs to Stop Taking Before your scan, stop taking these medicines for the length of time shown: Name of Drug Stop Taking  Amoxapine 4 days before  Benztropine  Cogentin 3 days before  Bupropion (Aplenzin, Budeprion, Voxra, Wellbutrin, Zyban) 48 hours before  Buspirone 15 hours before  Citalopram 24 hours before  Cocaine 6 hours before  Escitalopram 24 hours before  Methamphetamine 24 hours before  Methylphenidate (Concerta, Metadate, Methylin,  Ritalin) 20 hours before  Paroxetine 24 hours before  Selegilene 48 hours before  Sertraline 3 days before  If you are breastfeeding, or if there is any chance you are pregnant, please tell the scheduler or technologist (the person who will help you prepare for your scan). How is the scan done? When you first arrive, we will ask you to drink a small cup of water with potassium iodine in it. This water may have a metallic taste.  An hour after you drink the potassium iodine water, the technologist will inject a small amount of tracer into a vein in your arm or hand through your IV.  You must stay in the department for 30 minutes after the injection.  You will then have a break for 3 hours. It is OK to eat and drink during this break.  You must return to the clinic after this 3-hour break to have images of your brain taken.  Then, 4 hours after you receive your tracer injection, the technologist will take images of your brain with the gamma camera. You will lie flat on the exam table while these images are being taken.  You must not move while the camera is taking pictures. If you move, the pictures will be blurry and may have to be taken again.  Taking the images will take 40 to 45 minutes. Your total time in the imaging room will be about 1 hour.  You may also have a low-dose CT scan of your brain to help confirm any results. A CT  scan is another way to take images inside your body.  It will take about 5 hours from the time you drink the potassium iodine water until the scans are complete. What will I feel during the scan? The technologist will help make you as comfortable as possible on the exam table for the scan.  You may feel some minor discomfort from the IV.  Lying still on the exam table may be hard for some patients.  The camera will be close to your head. This may make you feel confined or uneasy (claustrophobic). Please tell the doctor who referred you for this scan if you know you are  claustrophobic. Are there any side effects from the scan? Most of the radioactivity from the tracer will pass out of your body in your urine or stool. The rest simply goes away over time.  Bad reactions to this scan are very rare. Fewer than 1% of patients (fewer than 1 out of 100) have a bad reaction. Reactions may include headache, nausea, vertigo (dizziness), or dry mouth. How do I get the results? When the test is over, the nuclear medicine doctor will review your images, prepare a written report, and talk with your doctor about the results. Your doctor will then talk with you about the results and your treatment options. If you needed to stop taking any medicines on the day of your scan, ask your doctor when to start taking them again.  The potentially interfering drugs consist of: amoxapine, amphetamine, benztropine, bupropion, buspirone, citalopram, cocaine, mazindol, methamphetamine, methylphenidate, norephedrine, phentermine, escitalopram,  phenylpropanolamine, selegiline, paroxetine, and sertraline   Parkinson's Disease Parkinson's disease is a movement disorder. It is a long-term condition that gets worse over time. Each person with Parkinson's disease is affected differently. This condition limits a person's ability to control movements and move the body normally. The condition can range from mild to severe. Parkinson's disease tends to get worse slowly over several years. What are the causes? Parkinson's disease is caused by a loss of brain cells (neurons) that make a brain chemical called dopamine. Dopamine is needed to control movement. As the condition gets worse, more neurons that make dopamine die. This makes it hard to move or control your movements. The exact cause of the loss of neurons is not known. Genes and the environment may contribute to the cause of Parkinson's disease. What increases the risk? The following factors may make you more likely to develop this  condition: Being male. Being age 24 or older. Having a family history of Parkinson's disease. Having had a traumatic brain injury. Having been exposed to toxins, such as pesticides. Having depression. What are the signs or symptoms? Symptoms of this condition can vary. The main symptoms are related to movement. These include: A tremor or shaking while you are resting. You cannot control the shaking. Stiffness in your arms and legs (rigidity). Slowing of movement. You may lose facial expressions and have trouble making small movements that are needed to button clothing or brush your teeth. An abnormal walk. You may walk with short, shuffling steps. Loss of balance and stability when standing. You may sway, fall backward, and have trouble making turns. Other symptoms include: Mental or cognitive changes, including: Depression or anxiety. Having false beliefs (delusions). Seeing, hearing, or feeling things that do not exist (hallucinations). Trouble speaking or swallowing. Changes in bowel or bladder functions, including constipation, having to go urgently or frequently, or not being able to control your bowel or bladder. Changes in  sleep habits, acting out dreams, or trouble sleeping. Depending on the severity of the symptoms, Parkinson's disease may be mild, moderate, or advanced. Parkinson's disease progression is different for everyone. Some people may not progress to the advanced stage. Mild Parkinson's disease involves: Movement problems that do not affect daily activities. Movement problems on one side of the body. Moderate Parkinson's disease involves: Movement problems on both sides of the body. Slowing of movement. Coordination and balance problems. Advanced Parkinson's disease involves: Extreme difficulty walking. Inability to live alone safely. Signs of dementia, such as having trouble remembering things, doing daily tasks such as getting dressed, and problem solving. How  is this diagnosed? This condition is diagnosed by a specialist. A diagnosis may be made based on symptoms, your medical history, and a physical exam. You may also have brain imaging tests to check for loss of neurons in the brain. How is this treated? There is no cure for Parkinson's disease. Treatment focuses on managing your symptoms. Treatment may include: Medicines. Everyone responds to medicines differently. Your response may change over time. Work with your health care provider to find the best medicines for you. Speech, occupational, and physical therapy. Deep brain stimulation surgery to reduce tremors and other involuntary movements. Follow these instructions at home: Medicines Take over-the-counter and prescription medicines only as told by your health care provider. Avoid taking medicines that can affect thinking, such as pain or sleeping medicines. Eating and drinking Follow instructions from your health care provider about eating or drinking restrictions. Do not drink alcohol. Activity Ask your health care provider if it is safe for you to drive. Do exercises as told by your health care provider or physical therapist. Lifestyle  Install grab bars and railings in your home to prevent falls. Do not use any products that contain nicotine or tobacco. These products include cigarettes, chewing tobacco, and vaping devices, such as e-cigarettes. If you need help quitting, ask your health care provider. Consider joining a support group for people with Parkinson's disease. General instructions Work with your health care provider to know the kind of day-to-day help that you may need and what to do to stay safe. Keep all follow-up visits. This is important. Follow-up visits include any visits with a physical therapist, speech therapist, or occupational therapist. Where to find more information [employer] Neurological Disorders and Stroke: [employer] Parkinson's  Foundation: www.parkinson.org Contact a health care provider if: Medicines do not help your symptoms. You are unsteady or have fallen at home. You need more support to function well at home. You have trouble swallowing. You have severe constipation. You are having problems with side effects from your medicines. You feel confused, anxious, depressed, or have hallucinations. Get help right away if you: Are injured after a fall. Cannot swallow without choking. Have chest pain or trouble breathing. Do not feel safe at home. Have thoughts about hurting yourself or others. These symptoms may represent a serious problem that is an emergency. Do not wait to see if the symptoms will go away. Get medical help right away. Call your local emergency services (911 in the [place] Do not drive yourself to the hospital. If you ever feel like you may hurt yourself or others, or have thoughts about taking your own life, get help right away. Go to your nearest emergency department or: Call your local emergency services (911 in the [place] Call a suicide crisis helpline, such as the National Suicide Prevention Lifeline at 279-276-8748 or 988 in the [place] This  is open 24 hours a day in the [place] Text the Crisis Text Line at 779-820-8048 (in the [place] Summary Parkinson's disease is a long-term condition that gets worse over time. This condition limits your ability to control your movements and move your body normally. There is no cure for Parkinson's disease. Treatment focuses on managing your symptoms. Work with your health care provider to know the kind of day-to-day help that you may need and what to do to stay safe. Keep all follow-up visits, including any visits with a physical therapist, speech therapist, or occupational therapist. This is important. This information is not intended to replace advice given to you by your health care provider. Make sure you discuss any questions you have with your health care  provider. Document Revised: 06/21/2021 Document Reviewed: 03/13/2021 Elsevier Patient Education  2024 [employer].

## 2023-05-16 ENCOUNTER — Encounter: Payer: Self-pay | Admitting: Neurology

## 2023-05-16 ENCOUNTER — Telehealth: Payer: Self-pay

## 2023-05-16 LAB — BASIC METABOLIC PANEL
BUN/Creatinine Ratio: 16 (ref 10–24)
BUN: 16 mg/dL (ref 8–27)
CO2: 27 mmol/L (ref 20–29)
Calcium: 10.1 mg/dL (ref 8.6–10.2)
Chloride: 105 mmol/L (ref 96–106)
Creatinine, Ser: 1.02 mg/dL (ref 0.76–1.27)
Glucose: 96 mg/dL (ref 70–99)
Potassium: 5 mmol/L (ref 3.5–5.2)
Sodium: 147 mmol/L — ABNORMAL HIGH (ref 134–144)
eGFR: 80 mL/min/{1.73_m2} (ref >59)

## 2023-05-16 NOTE — Telephone Encounter (Signed)
-----   Message from Anson Fret, MD sent at 05/16/2023  9:05 AM EDT ----- Labs unremarkable.

## 2023-05-16 NOTE — Telephone Encounter (Signed)
Lvm- 1st attempt

## 2023-05-22 ENCOUNTER — Telehealth: Payer: Self-pay

## 2023-05-22 NOTE — Telephone Encounter (Signed)
Call from wife, lab results given. She inquired if Dat scan order was placed and if she needed to reach out to them. Advised I would confirm and find out. Wife appreciative

## 2023-05-22 NOTE — Telephone Encounter (Signed)
-----   Message from Antonia B Ahern, MD sent at 05/16/2023  9:05 AM EDT ----- Labs unremarkable. 

## 2023-05-22 NOTE — Telephone Encounter (Signed)
That is a tori question, she gets all the imagine gapproved. I just ordered it less than a week ago though.

## 2023-05-22 NOTE — Telephone Encounter (Signed)
2nd attempt, call and no answer, left message to return call.

## 2023-05-23 NOTE — Telephone Encounter (Signed)
[employer] no auth required via [employer] sent to [facility]  [phone]

## 2023-05-29 ENCOUNTER — Telehealth: Payer: Self-pay | Admitting: Psychiatry

## 2023-05-29 ENCOUNTER — Other Ambulatory Visit: Payer: Self-pay | Admitting: Psychiatry

## 2023-05-29 DIAGNOSIS — F411 Generalized anxiety disorder: Secondary | ICD-10-CM

## 2023-05-29 NOTE — Telephone Encounter (Signed)
Patient called in for refill on Lorazepam 1mg . Ph: [phone] Appt 12/2 Pharmacy [facility] [address] [place], [place]

## 2023-06-20 DIAGNOSIS — R030 Elevated blood-pressure reading, without diagnosis of hypertension: Secondary | ICD-10-CM | POA: Diagnosis not present

## 2023-06-20 DIAGNOSIS — H6123 Impacted cerumen, bilateral: Secondary | ICD-10-CM | POA: Diagnosis not present

## 2023-06-20 DIAGNOSIS — Z6827 Body mass index (BMI) 27.0-27.9, adult: Secondary | ICD-10-CM | POA: Diagnosis not present

## 2023-07-02 ENCOUNTER — Other Ambulatory Visit: Payer: Self-pay | Admitting: Psychiatry

## 2023-07-02 DIAGNOSIS — F314 Bipolar disorder, current episode depressed, severe, without psychotic features: Secondary | ICD-10-CM

## 2023-07-08 ENCOUNTER — Other Ambulatory Visit: Payer: Self-pay | Admitting: Neurology

## 2023-07-08 ENCOUNTER — Telehealth: Payer: Self-pay | Admitting: Neurology

## 2023-07-08 MED ORDER — ALPRAZOLAM 0.25 MG PO TABS: ORAL_TABLET | ORAL | 0 refills | Status: DC

## 2023-07-08 NOTE — Telephone Encounter (Signed)
Are you willing to prescribe something to help pt relax for DAT scan?

## 2023-07-08 NOTE — Telephone Encounter (Signed)
Pt calling back to check on medication for claustrophobic has been sent to the pharmacy.  Pt said can send medication to  [id number]   Pt would like a call back when medication has been sent.

## 2023-07-08 NOTE — Telephone Encounter (Signed)
FYI Pt has called x3

## 2023-07-08 NOTE — Telephone Encounter (Signed)
I sent in xanax but I also see that he has ativan on his medication list. I wouldn't take them at the same time as they can cause excessive sedation together. I would take the Xanax before the DAT Scan.

## 2023-07-08 NOTE — Telephone Encounter (Signed)
Patient called in stating her is claustrophobic and needs something to help get through the DAT scan he is scheduled to have tomorrow. Let him know someone would be giving him a c/b

## 2023-07-09 ENCOUNTER — Encounter (HOSPITAL_COMMUNITY)
Admission: RE | Admit: 2023-07-09 | Discharge: 2023-07-09 | Disposition: A | Discharge status: Home / Self Care | Payer: Medicare HMO | Source: Ambulatory Visit | Attending: Neurology | Admitting: Neurology

## 2023-07-09 DIAGNOSIS — W19XXXA Unspecified fall, initial encounter: Secondary | ICD-10-CM | POA: Diagnosis not present

## 2023-07-09 DIAGNOSIS — R269 Unspecified abnormalities of gait and mobility: Secondary | ICD-10-CM

## 2023-07-09 DIAGNOSIS — G20C Parkinsonism, unspecified: Secondary | ICD-10-CM | POA: Diagnosis not present

## 2023-07-09 DIAGNOSIS — R2689 Other abnormalities of gait and mobility: Secondary | ICD-10-CM | POA: Diagnosis not present

## 2023-07-09 DIAGNOSIS — G259 Extrapyramidal and movement disorder, unspecified: Secondary | ICD-10-CM | POA: Diagnosis not present

## 2023-07-09 MED ORDER — POTASSIUM IODIDE (ANTIDOTE) 130 MG PO TABS
ORAL_TABLET | ORAL | Status: AC
  Filled 2023-07-09: qty 1

## 2023-07-09 MED ORDER — SODIUM IODIDE I-123 7.4 MBQ CAPS
4.8000 | ORAL_CAPSULE | Freq: Once | ORAL | Status: AC
  Administered 2023-07-09: 4.8 via ORAL

## 2023-07-09 MED ORDER — IOFLUPANE I 123 185 MBQ/2.5ML IV SOLN
4.8000 | Freq: Once | INTRAVENOUS | Status: AC | PRN
  Administered 2023-07-09: 4.8 via INTRAVENOUS
  Filled 2023-07-09: qty 5

## 2023-07-09 NOTE — Telephone Encounter (Signed)
Please advise pt of MD recommendations. His MRI is at 9am

## 2023-07-12 DIAGNOSIS — H5201 Hypermetropia, right eye: Secondary | ICD-10-CM | POA: Diagnosis not present

## 2023-07-16 ENCOUNTER — Telehealth: Payer: Self-pay | Admitting: *Deleted

## 2023-07-16 NOTE — Telephone Encounter (Signed)
LVM for pt to call back to discuss datscan results  Per Dr Lucia Gaskins DAT scan is NOT consistent with parkinsonian discorder. Great news. Dr Lucia Gaskins      Please make pt aware

## 2023-07-23 NOTE — Telephone Encounter (Signed)
Spoke to patient gave DATSCAn results Pt wanted to know if  Dr Lucia Gaskins had any suggestions since scan was not consistent with parkinsonian discorder. . Made pt aware in Dr Lucia Gaskins last office note that if scan was negative can repeat MRI Brain and cervical spine  last  MRI was 2019 Pt declined MRI and ask if there are anymore suggestions to move forward . Pt thanked me for calling

## 2023-07-25 ENCOUNTER — Other Ambulatory Visit: Payer: Self-pay

## 2023-07-25 ENCOUNTER — Telehealth: Payer: Self-pay | Admitting: Psychiatry

## 2023-07-25 DIAGNOSIS — F411 Generalized anxiety disorder: Secondary | ICD-10-CM

## 2023-07-25 MED ORDER — LORAZEPAM 1 MG PO TABS
1.5000 mg | ORAL_TABLET | Freq: Three times a day (TID) | ORAL | 0 refills | Status: DC | PRN
Start: 2023-07-25 — End: 2023-08-26

## 2023-07-25 NOTE — Telephone Encounter (Signed)
His symptoms are likely medication related, due ot years of dopamine blocking meds and thse meds can cause these symptoms. He should follow up with his psychiatrist to see what they can do with his medications. Dr Lucia Gaskins

## 2023-07-25 NOTE — Telephone Encounter (Signed)
Returned call and repeated the directions. He repeated back to me correctly.

## 2023-07-25 NOTE — Telephone Encounter (Signed)
Hawk lvm today at 3:11 stating he is still a little confused with the medication directions he was given. He is requesting a call to explain the directions again. He apologize for the inconvenience and confusion.  Contact # [phone]

## 2023-07-25 NOTE — Telephone Encounter (Signed)
Next appt is 11/11/23. Reginald Tucker called and is feeling overwhelmed with everything and is having trouble with anxiety. Could someone please call him at [phone].

## 2023-07-25 NOTE — Telephone Encounter (Signed)
Patient notified. Rx pended.

## 2023-07-25 NOTE — Telephone Encounter (Signed)
Simplest solution in the short-term is to increase the lorazepam to 1-1/2 tablets 3 times a day.  If the anxiety does not get better or if he gets too sleepy from the lorazepam we may have to make some other changes and let us know.

## 2023-07-25 NOTE — Telephone Encounter (Signed)
Patient reporting he feels overwhelmed, struggles to leave the house. Reports it isn't too bad in the morning, but as the day progresses he is worse. No new stressors. It takes a long time for him to get to sleep, (2 hours last night) but he can stay asleep.   Taking: Lamictal 100 mg - 1 AM, 2 PM Lorazepam 1 mg TID Seroquel 450 mg

## 2023-07-26 ENCOUNTER — Telehealth: Payer: Self-pay | Admitting: Psychiatry

## 2023-07-26 NOTE — Telephone Encounter (Signed)
Pt LVM @ 2:16p.  He said the pharmacy has sent [place] a fax for a PA for the increase in Lorazepam.  Next appt 12/2

## 2023-07-26 NOTE — Telephone Encounter (Signed)
PA was initiated by Traci.

## 2023-07-26 NOTE — Telephone Encounter (Signed)
Pt called back at 3:24p.  I advised him he can call back to the pharmacy or call back on Monday to see if we have gotten it.  He advised that he runs out of meds on Mon.

## 2023-07-26 NOTE — Telephone Encounter (Signed)
LVM for pt to call back to discuss DR Aherns recommendation

## 2023-07-26 NOTE — Telephone Encounter (Signed)
I haven't seen a fax, but CC increase dose from 1 mg TID to 1.5 mg TID.

## 2023-07-26 NOTE — Telephone Encounter (Signed)
Prior Authorization submitted with [facility] for Lorazepam 1 mg tablet take 1.5 tablets tid. #135.   Pt last filled for #90 Lorazepam 1 mg tablets on 07/02/23.  Pending at this time

## 2023-07-26 NOTE — Telephone Encounter (Signed)
PA approved and patient notified.

## 2023-07-26 NOTE — Telephone Encounter (Signed)
Prior Approval received for Lorazepam 1 mg #135 with [employer] effective 12/10/2022-12/10/2023, PA # [id number]

## 2023-07-31 NOTE — Telephone Encounter (Signed)
I see Randa Evens tried to reach pt again on 8/20 and LVM.

## 2023-08-06 ENCOUNTER — Encounter: Payer: Self-pay | Admitting: *Deleted

## 2023-08-06 NOTE — Telephone Encounter (Signed)
I tried to call the patient, this is our third attempt to reach him.  He has not called [place] back yet.  I left voicemail with office number.  I have also mailed out a letter to his home.

## 2023-08-13 DIAGNOSIS — D225 Melanocytic nevi of trunk: Secondary | ICD-10-CM | POA: Diagnosis not present

## 2023-08-13 DIAGNOSIS — H61002 Unspecified perichondritis of left external ear: Secondary | ICD-10-CM | POA: Diagnosis not present

## 2023-08-13 DIAGNOSIS — Z85828 Personal history of other malignant neoplasm of skin: Secondary | ICD-10-CM | POA: Diagnosis not present

## 2023-08-13 DIAGNOSIS — D2271 Melanocytic nevi of right lower limb, including hip: Secondary | ICD-10-CM | POA: Diagnosis not present

## 2023-08-13 DIAGNOSIS — L218 Other seborrheic dermatitis: Secondary | ICD-10-CM | POA: Diagnosis not present

## 2023-08-13 DIAGNOSIS — Q825 Congenital non-neoplastic nevus: Secondary | ICD-10-CM | POA: Diagnosis not present

## 2023-08-13 DIAGNOSIS — D2261 Melanocytic nevi of right upper limb, including shoulder: Secondary | ICD-10-CM | POA: Diagnosis not present

## 2023-08-13 DIAGNOSIS — L821 Other seborrheic keratosis: Secondary | ICD-10-CM | POA: Diagnosis not present

## 2023-08-14 NOTE — Telephone Encounter (Signed)
Pt's wife [name] (on [place]) called me back and we discussed the latest message from Dr Lucia Gaskins as noted below. Pt's wife verbalized understanding. She states they have spoken with psychiatry and were told there will be side effects to medications. They discuss and weigh out benefit of medication despite side effects vs the risk of not being on the medication. She states the patient has started using Doterra oils , sublingual Frankincense and Copaiba (?), and the hand shaking has stopped. He also applies other doterra oils topically 3-4 times per day. We discussed and she would like to continue seeing how patient does with the oils, follow with psychiatry to ensure he remains on the correct treatment regimen and if he needs an appt with neurology in future, she will call. She was appreciative.

## 2023-08-26 ENCOUNTER — Other Ambulatory Visit: Payer: Self-pay | Admitting: Psychiatry

## 2023-08-26 DIAGNOSIS — F411 Generalized anxiety disorder: Secondary | ICD-10-CM

## 2023-08-26 DIAGNOSIS — F314 Bipolar disorder, current episode depressed, severe, without psychotic features: Secondary | ICD-10-CM

## 2023-09-03 DIAGNOSIS — R2689 Other abnormalities of gait and mobility: Secondary | ICD-10-CM | POA: Diagnosis not present

## 2023-09-03 DIAGNOSIS — R7303 Prediabetes: Secondary | ICD-10-CM | POA: Diagnosis not present

## 2023-09-03 DIAGNOSIS — E559 Vitamin D deficiency, unspecified: Secondary | ICD-10-CM | POA: Diagnosis not present

## 2023-09-03 DIAGNOSIS — F319 Bipolar disorder, unspecified: Secondary | ICD-10-CM | POA: Diagnosis not present

## 2023-09-03 DIAGNOSIS — E039 Hypothyroidism, unspecified: Secondary | ICD-10-CM | POA: Diagnosis not present

## 2023-09-03 DIAGNOSIS — I10 Essential (primary) hypertension: Secondary | ICD-10-CM | POA: Diagnosis not present

## 2023-09-03 DIAGNOSIS — E782 Mixed hyperlipidemia: Secondary | ICD-10-CM | POA: Diagnosis not present

## 2023-09-24 ENCOUNTER — Other Ambulatory Visit: Payer: Self-pay

## 2023-09-24 ENCOUNTER — Other Ambulatory Visit: Payer: Self-pay | Admitting: Psychiatry

## 2023-09-24 ENCOUNTER — Telehealth: Payer: Self-pay | Admitting: Psychiatry

## 2023-09-24 DIAGNOSIS — F411 Generalized anxiety disorder: Secondary | ICD-10-CM

## 2023-09-24 MED ORDER — LORAZEPAM 1 MG PO TABS
ORAL_TABLET | ORAL | 0 refills | Status: DC
Start: 2023-09-24 — End: 2023-10-24

## 2023-09-24 MED ORDER — LORAZEPAM 1 MG PO TABS
1.0000 mg | ORAL_TABLET | Freq: Three times a day (TID) | ORAL | 0 refills | Status: DC
Start: 2023-09-24 — End: 2023-09-24

## 2023-09-24 NOTE — Telephone Encounter (Signed)
PENDED

## 2023-09-24 NOTE — Telephone Encounter (Signed)
Pt called at 9:30 for refill on Lorazepam to   [facility] #[id number] [facility], [place] - [address] [facility] [address], [place] [address] Phone: [phone]  Fax: [phone]    Next appt 12/2

## 2023-09-24 NOTE — Telephone Encounter (Signed)
Noted.  Corrected RX sent

## 2023-09-24 NOTE — Telephone Encounter (Signed)
RF was sent today for lorazepam 1 mg TID, #90. Patient is taking 1.5 mg TID. Dose was increased on 8/15. Was this to be just a short-term increase?   From 8/15 TC:  Simplest solution in the short-term is to increase the lorazepam to 1-1/2 tablets 3 times a day. If the anxiety does not get better or if he gets too sleepy from the lorazepam we may have to make some other changes and let us know.

## 2023-10-23 ENCOUNTER — Encounter: Payer: Self-pay | Admitting: Psychiatry

## 2023-10-24 ENCOUNTER — Other Ambulatory Visit: Payer: Self-pay | Admitting: Psychiatry

## 2023-10-24 DIAGNOSIS — F314 Bipolar disorder, current episode depressed, severe, without psychotic features: Secondary | ICD-10-CM

## 2023-10-24 DIAGNOSIS — F411 Generalized anxiety disorder: Secondary | ICD-10-CM

## 2023-10-24 NOTE — Telephone Encounter (Signed)
LF 10/15; LV 05/28; NV 12/2

## 2023-10-24 NOTE — Telephone Encounter (Signed)
Pt called requesting refill of Lorazepam 1mg  135# and Quetiapine 300mg  to    [facility] #[id number] [place], [place] - [address] AT [facility] [address], [place] [address] Phone: [phone]  Fax: [phone]   He said he only has 6 days of Quetiapine left. I told him he should have a refill. Next appt 12/2

## 2023-10-25 ENCOUNTER — Telehealth: Payer: Self-pay | Admitting: Psychiatry

## 2023-10-25 DIAGNOSIS — F314 Bipolar disorder, current episode depressed, severe, without psychotic features: Secondary | ICD-10-CM

## 2023-10-25 NOTE — Telephone Encounter (Signed)
Reginald Tucker called at 11:55 to report that the pharmacy won't refill his quetiapine.  They say it is an early refill and not due until 11/09/23.  He will about 8 days short.  Please call pharmacy to approve an early refill or if insurance won't pay for it because its early, send In a short day supply for him pay out of pocket.  Please let him know which is best for him so he will know what he is picking up - 8 day supply using goodrx or picking up his full prescription.  Appt 12/2.

## 2023-10-30 MED ORDER — QUETIAPINE FUMARATE 300 MG PO TABS
450.0000 mg | ORAL_TABLET | Freq: Every day | ORAL | 0 refills | Status: DC
Start: 2023-10-30 — End: 2023-11-11

## 2023-10-30 NOTE — Telephone Encounter (Signed)
Patient needs 12 tablets to get him until he can RF a full supply on 11/30. He knows he will need to use GoodRx. He is unable to say why he is coming up short. Rx sent.

## 2023-10-30 NOTE — Telephone Encounter (Signed)
Reginald Tucker called to check status of being able to fill his prescription for Quetiapine.    Please let him know that we have approved the early refill so he can get his medication.

## 2023-10-30 NOTE — Telephone Encounter (Signed)
Pt last filled 90-day supply on 9/17 per pharmacy. Insurance will not cover until 11/30. Patient doesn't know why he is coming up short, says the pharmacy must have shorted him.  Will pend Rx to get him to his next RF and he can use GoodRx.

## 2023-11-06 ENCOUNTER — Other Ambulatory Visit: Payer: Self-pay | Admitting: Psychiatry

## 2023-11-06 DIAGNOSIS — F3162 Bipolar disorder, current episode mixed, moderate: Secondary | ICD-10-CM

## 2023-11-06 NOTE — Telephone Encounter (Signed)
Lv 05/28 rx says, Sig: TAKE 1 TABLETS(200 MG) BY MOUTH IN THE MORNING AND 2 TABLETS AT NIGHT.  And lv note says, Better with Reduced lamotrigine 100 mg AM and 200 mg HS to see if balance is better given level of 10.5 on 400 mg daily.   Do I need to send in a different dose of Lamictal to satisfy the 400 mg?  I called and spoke with the pt, he is taking it as written ad says that it seems to be working.

## 2023-11-11 ENCOUNTER — Ambulatory Visit: Payer: Medicare HMO | Admitting: Psychiatry

## 2023-11-11 ENCOUNTER — Encounter: Payer: Self-pay | Admitting: Psychiatry

## 2023-11-11 DIAGNOSIS — F411 Generalized anxiety disorder: Secondary | ICD-10-CM

## 2023-11-11 DIAGNOSIS — G3184 Mild cognitive impairment, so stated: Secondary | ICD-10-CM

## 2023-11-11 DIAGNOSIS — F900 Attention-deficit hyperactivity disorder, predominantly inattentive type: Secondary | ICD-10-CM

## 2023-11-11 DIAGNOSIS — F314 Bipolar disorder, current episode depressed, severe, without psychotic features: Secondary | ICD-10-CM | POA: Diagnosis not present

## 2023-11-11 MED ORDER — QUETIAPINE FUMARATE 300 MG PO TABS
450.0000 mg | ORAL_TABLET | Freq: Every day | ORAL | 1 refills | Status: DC
Start: 2023-11-11 — End: 2024-02-05

## 2023-11-11 NOTE — Progress Notes (Signed)
------------------------------- [id number] Dec 28, 1955 67 y.o.  Virtual Visit via Telephone Note  I connected with pt by telephone and verified that I am speaking with the correct person using two identifiers.   I discussed the limitations, risks, security and privacy concerns of performing an evaluation and management service by telephone and the availability of in person appointments. I also discussed with the patient that there may be a patient responsible charge related to this service. The patient expressed understanding and agreed to proceed.  I discussed the assessment and treatment plan with the patient. The patient was provided an opportunity to ask questions and all were answered. The patient agreed with the plan and demonstrated an understanding of the instructions.   The patient was advised to call back or seek an in-person evaluation if the symptoms worsen or if the condition fails to improve as anticipated.  I provided 22 minutes of non-face-to-face time during this encounter. The patient was located at home and the provider was located office. Session from 1140-1202  Subjective:   Patient ID:  Reginald Tucker is a 67 y.o. (DOB 03/19/1956) male.  Chief Complaint:  Chief Complaint  Patient presents with   Follow-up    Anxiety Symptoms include nervous/anxious behavior and shortness of breath. Patient reports no chest pain, confusion, decreased concentration or suicidal ideas.    Depression        Associated symptoms include no decreased concentration, no fatigue and no suicidal ideas.  Past medical history includes anxiety.    Reginald Tucker presents to the office today for follow-up of TRD and anxiety.  When seen April 06, 2019.  He had not seen any mood benefit from low-dose pramipexole and had side effects at higher dosages.  Therefore we weaned him off of the that medication.  visit August 07, 2019 and the following changes were made: Option retry Vraylar with  selegiline which wasn't adequately done before.  Prior trial inadequate duration and SE Retry Vraylar at a lower dosage to prevent tremors: 1 capsule every Monday, Wednesday, Friday only. Made him jittery and stopped after a week.  Didn't see mood benefit.  October 2020 was last appointment and there were no med changes as the patient was going to seek a second opinion.  The following was noted: Still irritable and real tired.  Easily fatigued with normal activity.  Appt with PCP Oct 27 to evaluate. Not much tremor. Waves of depression worse in the morning.  Sleeping too much in recliner and lethargic.  Deep sleep.  Takes 60 min to fall asleep.  Cant' shut off his brain and hard to fall asleep.  Reads a lot and enjoys it especially history.  Pt reports that mood is Anxious, Depressed and Irritable rated 7/10 bad and describes anxiety as Moderate and occassional.  Less anxiety this visit than depression..  Staying in the house is making it worse too.   Anxiety symptoms include: Excessive Worry, Panic Symptoms, Social Anxiety,. Has claustrophobia.  Hard to ride elevators .  Can't go up the steps here.  Pt reports no sleep issues. Pt reports that appetite is good. Pt reports that energy is poor and anhedonia, loss of interest or pleasure in usual activities, poor motivation and withdrawn from usual activities. Concentration is down slightly. Suicidal thoughts:  denied by patient.  Still has days that does very little.  Will clean occ.   10/17/2020 appointment with the following noted:  Wife joined Patient has requested refills from this location but has not  been seen here in a year which is not appropriate given his level of instability historically. Didn't go for second opinion since here.   Still doing about the same without much change.  Some days are better than others. Wife says he does some things and will go out sometimes.  Doesn't want to go out at dark.  Thinks winter is the worst time. Asked  about TMS. Tolerating meds OK.   Chronically depressed and easily irritable, without pattern except brief. Wife says some weeks are worse than others. No walking or exercise.  Balance issues.  Was better when did PT but he quit the exercises. Patient is highly claustrophobic and states is getting worse.  He is having trouble using the elevator to get to our office.  He asked about finding another psychiatrist who has an office on the first floor and we discussed options.  Normal card review recently.  02/21/21 appt noted: W involved in call. Very sick with covid pneumonia and nearly died in 2022/12/18 and still recovering. Doing breathing exercises and some walking.  Weak in am. A lot of depression in [facility] thinking he would die.  Now dep 7/10.   No change in mood off selegiline per pt or Reginald Tucker but she's not sure. Concerta didn't help and they agree.  He stopped it and went back on selegiline. Memory is bad. Taking melatonin 10 comes and goes with benefit.  Takes 1 and 1/2 hours to go to sleep. To bed 1110 and up at 9-930 Plan: DC selegiline   Wait 5 days to clear and start [facility] ER 36 mg each AM.   If NR after 2 weeks, then ER increase if they call.  03/29/2021 phone call from wife reporting that Caplyta was working and they wanted more samples while we worked on a PA  04/24/2021 appointment with the following noted:  Wife on session also Took [facility] and couldn't tell a difference except wife said he was more anxious.  Stopped it. Caplyta 42 mg daily and now says he's worse.  Not doing well at all.  Wonders if he's worse bc of the reduction in Seroquel.  Going through hell for 3-4 weeks with 8/10 depression.  No SI. Getting up earlier. More anxiety and hard to lay down. Came to a head with wife saying she's leaving for 24 days to help her daughter with a child.  She was gone 10 days in [facility].  Inconsistency in life.  Better if going to church but usually doesn't do it. Got saved my life from  Covid Plan: DC Caplyta DT failure. Return to Seroquel 400 bc worse without it. He wants to use risperidone 1 mg BID prn anxiety.  06/21/2021 appointment with the following noted:  Wife on call Only increased Seroquel to 300 mg HS. Doing great and about 3/10 depression.  Started improving pretty quickly.  Went back to church and out with friends.  W gone 3 weeks.  Going to Bible study.  Got together with friends he hasn't seen  in 4 years. I feel good. No SE. Sleep good usually.  Not sleeping excessively as much as in the past about 8-9 hours.  Anxiety is etter not gone.   Trying to walk without a cane.  Recent PCP was unremarkable.  Lost to 218#.    09/26/21 appt noted: wife also on call Great.  Gym 5-6 days per week. Lost 25#.  Getting out more.  Involved in church.  Feels better than last time  and more active and socializing.  Mostly out of depression 1-2/10. No SE.  Better with queiapine 300 vs 400 re: sedation. Wife agrees he's dramatically better all around. Needs and gets 8-9 hours nightly and 2 brief naps daily. Anxiety is manageable and risperidone helps.  Risperidone also helps keep down the level of irritability which is occasional and manageable. Plan: Continue Seroquel 300 mg HS Continue risperidone 1 mg twice daily as he is feels it is helping Continue lamotrigine 200 mg twice daily   01/23/22 appt noted: Still doing fine with depression with some seasonal blues.  Involved in couple of Bible studies.  Walking and reading books.  Getting out and doing things.  Men's and couples conferences. [employer]. Does chores.  Good health. Depression 2/10.  Sleep pretty good with occ initial insomnia.  Restorative.   No SE problems Plan: No change indicated.   Continue Seroquel 300 mg HS Continue risperidone 1 mg twice daily as he is feels it is helping Continue lamotrigine 200 mg twice daily  06/26/22 appt noted: I con't have any depression and anxiety No complaints.   Active at church.   Slowed DT hernia.  Surg 8/4 bc bothers him.  Curtailed physcial activity. No SE. Hard to shut off brain at night.  Reads bible.  Takes awhile to go to sleep.  Chronic problem.  Sleep from 1-8 but would like to sleep more. Plan: No med changes  12/27/2022 appointment noted: CC anxiety more than SE concerns On lamotrigine and Seroquel and lorazepam Ran out of risperidone for a a couple of months had trouble with refill. Wife looked up SE meds. Asks about SE each.  Disc balance issues.  Wife concerned about his memory and balance. Not sedated.  No falls lately.  Is having some memory issues.  STM issues. Everything is ok with quetiapine. Sometimes problems with balance.  Long term issue.  Can sometimes shuffle feet.   Mood for the most part steady with occ flare ups . Anxiety not well controlled.  Taking more lorazepam lately.  Lasts about 8 hours. About 2 daily.  Anxiety picked up 5-6 months ago.  No pressure on him.  No reason for the anxiety. Plan: Continue Seroquel 300 mg HS Continue risperidone 1 mg twice daily as he is feels it is helping Continue lamotrigine 200 mg twice daily Lorazepam 0.5 mg TIDprn is safest option Check B12, folate, serum lamotrigine level Come to office to evaluate balance issue and for EPS  12/31/22 urgent appt noted:  seen with wife, seen in office medS: lamotrigine 200 mg BID, quetiapine  300 mg HS, been off risperidone for several months. 2-3 times a week feels anxiety is out of control and takes prn quetiapine 100 mg with anxiety. CC balance issues.  Not dizzy or lightheadedness.  Occ tremor.  No recent falls Sleep 8-9 hours night and nap but he disputes.  Wife says he lays in bed a couple of times daily.  She says it's more than he does. Irritable 3-4/10.  Not lately depressed.  No other manic sx.  When leaves house very anxious, almost housebound.  Sitts and watches TV.  Will go to grocery.  Not going to church for months DT anxiety.    Enjoyed family coming to the house.   Hand shaking about a month or so. Sometimes wife says speech mildly slurred.  No jerks.   Redness of face and saw derm and gave a cream.  He doesn't know the dx.  No exercise.   Plan: Continue Seroquel 300 mg HS Reduce lamotrigine 100 mg AM and 200 mg HS to see if balance is better given level of 10.5 Lorazepam 0.5 mg TID prn is safest option Checked B12, folate 12/28/22 were normal.   serum lamotrigine level was 10.5 on 200 bid. TSH low may be cause anxiety.  Send to PCP Tiburcio Pea Come to office to evaluate balance issue and for EPS  03/04/23 appt noted:  Good and more active.  Getting out more. Doesn't feel lorazepam is high enough.  Needs 1 mg in AM and then needs more in the afternoon.Marland Kitchen  a lot going on in the brain with antsy feelings.  Anxiety is not well controlled.    No SE with meds. Balance is better.  No leg jerking.  Sleep is pretty good overall, usually quicker to sleep, more refreshed.  Plan:  Increase Lorazepam 1 mg TID prn is safest option vs switch to clonazepam for unmanaged anxiety  05/07/23 appt noted: Meds: Seroquel 450 HS, lamotrigine 100 AM & 200 PM, lorazepam 1 mg TID. For the most part it is working with more lorazepam.  Some anxiety in the pm. Washed and waxed cars.  More active as much as possible.  Active. No pattern for down days. Sleep unchanged with initial ins.  In bed 10 1/2 hours.   No SE.   Feels blessed.   Satisfied with meds.   Had to put one of the dogs to sleep yesterday.    11/11/23 appt noted:  Called in summer to incr lorazepam.   Meds: lorazepam 2 mg Am and 1 mg later and then 1.5 mg PM,  Seroquel 450 HS, lamotrigine 100 AM & 200 PM, Sleep good with quetiapine.  Not sure why ran out of it early but has 90 days supply now.   For the most part mood is pretty stable.   Anxiety better with meds. Walks regularly and exercise.  Grocery shopping.  Will rake leaves Wt 210# but feels good.   No sleepiness with meds  during the day.   No SE with meds.    Sleep study negative for OSA noted on chart.  Past Psychiatric Medication Trials: He has had multiple psych med failures as well .   Past psychiatric medications used include pramipexole NR, sertraline,  Trintellix 1.5 mg MWF jittery,  selegiline max 30mg  daily, Paxil, Wellbutrin, buspirone,   lamotrigine, carbamazepine, lithium with a tremor,  Depakote was side effects of feeling heavy and increased ammonia,    Seroquel 800 mg a day, Latuda 120 mg a day,  Rexulti,  Perphenazine,  Vraylar 3mg  akathisia, He did not tolerate Vraylar even at 1.5 mg 3 days a week. risperidone, olanzapine, quetiapine Caplyta 42 NR  modafinil,  Adderall with selegiline with BP problems, Concerta 54 NR Light therapy failure. ECT twice, failed 2nd time. Affectively better  after adding pramipexole to 0.5 mg twice daily and increasing Seroquel to 600 mg daily but lost response apparently soon thereafter.  Psychiatric 2nd opinion: Dr. Wellington Hampshire  Review of Systems:  Review of Systems  Constitutional:  Negative for fatigue.  Respiratory:  Positive for shortness of breath.   Cardiovascular:  Negative for chest pain.  Gastrointestinal:  Positive for abdominal pain.  Musculoskeletal:  Positive for back pain.  Psychiatric/Behavioral:  Negative for agitation, behavioral problems, confusion, decreased concentration, dysphoric mood, hallucinations, self-injury, sleep disturbance and suicidal ideas. The patient is nervous/anxious. The patient is not hyperactive.   Occurs 2-3 times/weekre:  leg weakness.  Not orthostatic.  Medications: I have reviewed the patient's current medications.  Current Outpatient Medications  Medication Sig Dispense Refill   ALPRAZolam (XANAX) 0.25 MG tablet Take 1-2 tabs (0.25mg -0.50mg ) 30-60 minutes before procedure. May repeat if needed.Do not drive. 4 tablet 0   ascorbic acid (VITAMIN C) 1000 MG tablet Take 1,000 mg by mouth daily.      aspirin EC 81 MG tablet Take 81 mg by mouth daily.     Cholecalciferol (VITAMIN D3) 25 MCG (1000 UT) CAPS Take 1,000 Units by mouth daily.     L-THEANINE PO Take by mouth.     lamoTRIgine (LAMICTAL) 100 MG tablet TAKE 1 TABLET BY MOUTH EVERY MORNING AND 2 TABLETS AT NIGHT 270 tablet 0   levothyroxine (SYNTHROID) 175 MCG tablet Take 175 mcg by mouth daily before breakfast.     LORazepam (ATIVAN) 1 MG tablet TAKE 1 TO 1 AND 1/2 TABLETS BY MOUTH THREE TIMES DAILY AS NEEDED FOR ANXIETY 135 tablet 1   lovastatin (MEVACOR) 40 MG tablet Take 40 mg by mouth daily.      MAGNESIUM PO Take 3 tablets by mouth daily. 3750 mg total     Omega-3 Fatty Acids (SUPER OMEGA 3 PO) Take 1 capsule by mouth daily.     omeprazole (PRILOSEC) 20 MG capsule Take 20 mg by mouth daily.     vitamin B-12 (CYANOCOBALAMIN) 50 MCG tablet Take 50 mcg by mouth daily.     zinc gluconate 50 MG tablet Take 50 mg by mouth daily.     QUEtiapine (SEROQUEL) 300 MG tablet Take 1.5 tablets (450 mg total) by mouth at bedtime. 135 tablet 1   No current facility-administered medications for this visit.    Medication Side Effects:got nervous and irritable with 1.5 mg pramipexole in morning, ok with it split up and no change in mood.  Allergies:  Allergies  Allergen Reactions   Lithium     Makes pt jittery/causes anxiety    Ambien [Zolpidem Tartrate] Anxiety    Nervous, uncontrollable   Sulfa Antibiotics Other (See Comments)    UNSPECIFIED REACTION OF CHILDHOOD   Sulfamethoxazole Other (See Comments)    UNSPECIFIED REACTION OF CHILDHOOD    Past Medical History:  Diagnosis Date   Acquired hallux rigidus of right foot 04/26/2017   AKI (acute kidney injury) (HCC) 06/11/2017   Anxiety    Bipolar 1 disorder (HCC)    BMI 37.0-37.9, adult    BPH (benign prostatic hyperplasia)    Cataract    Cataract    L eye   CKD (chronic kidney disease), stage III (HCC)    Colon polyps    COPD GOLD II with restrictive component  01/13/2016    Spirometry 01/13/2016  FEV1 1.84 (47%)  Ratio 62  - 01/13/2016  extensive coaching HFA effectiveness =    90% > try stiolto respimat 2 pffs each am > did not benefit so stopped when sample out - 01/13/2016  Walked RA x 3 laps @ 185 ft each stopped due to  End of study, nl pace, no desat  / min sob  - PFT's  03/16/2016  FEV1 2.28 (59 % ) ratio 67  p 12 % improvement from saba p no prior to study with DLCO  66 % corrects to 86 % for alv volume      Depression    Dysrhythmia    Essential hypertension 01/19/2015   Family history of coronary arteriosclerosis 01/19/2015   Father with MI  Fatty liver    GERD (gastroesophageal reflux disease) 08/11/2014   History of colon polyps 06/18/2017   Hyperlipidemia    Hypertension    Hypothyroidism 08/11/2014   Hypothyroidism    Insomnia 06/18/2017   Insomnia    Memory change    Mixed hyperlipidemia 06/18/2017   Morbid obesity (HCC) 03/18/2016   Complicated by HBP/ Low erv on pfts 03/16/2016 (31%)     Morbid obesity due to excess calories (HCC)    Prediabetes    Sleep apnea    SOB (shortness of breath)    Thyroid disease    Tinnitus of both ears 06/18/2017   Tobacco use 06/18/2017   Unsteadiness on feet    Vitamin D deficiency 06/18/2017   Vitamin D deficiency     Family History  Problem Relation Age of Onset   Cancer Mother    Hyperlipidemia Father    Hypertension Father    CAD Father    Stroke Father    Aortic aneurysm Father    Prostate cancer Father    Hyperlipidemia Brother    Appendicitis Maternal Grandfather     Social History   Socioeconomic History   Marital status: Married    Spouse name: Not on file   Number of children: Not on file   Years of education: Not on file   Highest education level: Associate degree: occupational, Scientist, product/process development, or vocational program  Occupational History   Occupation: act.  assist  Tobacco Use   Smoking status: Former    Current packs/day: 0.00    Average packs/day: 1 pack/day for 25.0 years (25.0 ttl pk-yrs)     Types: Cigarettes    Start date: 12/11/1975    Quit date: 12/10/2000    Years since quitting: 22.9   Smokeless tobacco: Never   Tobacco comments:    heavy vape user- quit vaping 06/04/2017  Vaping Use   Vaping status: Former  Substance and Sexual Activity   Alcohol use: No    Alcohol/week: 0.0 standard drinks of alcohol   Drug use: No   Sexual activity: Not on file  Other Topics Concern   Not on file  Social History Narrative   Admitted to [facility] 06/14/17- discharged   Lives at home with his wife   Married - Reginald Tucker   Former smoker - stopped 2002   Alcohol none   Full code   Right handed   Drinks 2 cups of caffeine daily   Social Determinants of Corporate investment banker Strain: Not on file  Food Insecurity: Not on file  Transportation Needs: Not on file  Physical Activity: Not on file  Stress: Not on file  Social Connections: Not on file  Intimate Partner Violence: Not on file    Past Medical History, Surgical history, Social history, and Family history were reviewed and updated as appropriate.   Please see review of systems for further details on the patient's review from today.   Objective:   Physical Exam:  There were no vitals taken for this visit.  Physical Exam Neurological:     Mental Status: He is alert and oriented to person, place, and time.     Cranial Nerves: No dysarthria.  Psychiatric:        Attention and Perception: Attention and perception normal.        Mood and Affect: Mood is anxious.        Speech: Speech normal.        Behavior: Behavior is cooperative.  Thought Content: Thought content normal. Thought content is not paranoid or delusional. Thought content does not include homicidal or suicidal ideation. Thought content does not include suicidal plan.        Cognition and Memory: Cognition and memory normal.        Judgment: Judgment normal.     Comments: Insight intact    Lab Review:     Component Value Date/Time   NA 147 (H)  05/15/2023 1550   K 5.0 05/15/2023 1550   CL 105 05/15/2023 1550   CO2 27 05/15/2023 1550   GLUCOSE 96 05/15/2023 1550   GLUCOSE 118 (H) 02/21/2022 1054   BUN 16 05/15/2023 1550   CREATININE 1.02 05/15/2023 1550   CALCIUM 10.1 05/15/2023 1550   PROT 6.9 02/11/2018 0623   ALBUMIN 4.2 02/11/2018 0623   AST 25 02/11/2018 0623   ALT 33 02/11/2018 0623   ALKPHOS 89 02/11/2018 0623   BILITOT 0.9 02/11/2018 0623   GFRNONAA >60 02/21/2022 1054   GFRAA >60 08/13/2018 1020       Component Value Date/Time   WBC 5.5 02/21/2022 1054   RBC 5.34 02/21/2022 1054   HGB 16.7 02/21/2022 1054   HCT 50.7 02/21/2022 1054   PLT 204 02/21/2022 1054   MCV 94.9 02/21/2022 1054   MCH 31.3 02/21/2022 1054   MCHC 32.9 02/21/2022 1054   RDW 12.4 02/21/2022 1054   LYMPHSABS 1.3 08/13/2018 1020   MONOABS 0.7 08/13/2018 1020   EOSABS 0.1 08/13/2018 1020   BASOSABS 0.1 08/13/2018 1020    No results found for: "POCLITH", "LITHIUM"   No results found for: "PHENYTOIN", "PHENOBARB", "VALPROATE", "CBMZ"   12/28/22      Component Ref Range & Units 3 d ago  Lamotrigine Lvl 2.5 - 15.0 mcg/mL 10.8    On 200 mg BID    .res Assessment: Plan:    Severe bipolar I disorder with depression (HCC) - Plan: QUEtiapine (SEROQUEL) 300 MG tablet  Generalized anxiety disorder  Mild cognitive impairment  Attention deficit hyperactivity disorder (ADHD), predominantly inattentive type    Severe TRD and anxiety.   Seasonal pattern of depression in bipolar is not uncommon   Disc history of TRD.  He is markedly better at this point on quetiapine 300 mg nightly and lamotrigine 300 mg daily  Anxiety is improved not resolved with lorazepam 1.5 mg TID  Also we discussed at length that the increased and his physical and social activity and spiritual activity is clearly having a marked positive effect on his mood as well.  It is unlikely for medication alone to maintain him with regard to his mood.  He has a history  of multiple recurrences and very unstable bipolar disorder.  He agrees to try to keep continue his positive social and physical behaviors.  He is also added gym 5 days a week which is clearly helping.  Disc each med and SE issues.  Disc purpose of each.  There is no way to treat his history of psych problems without meds that have potential SE in these areas.   Disc balancing benefit / SE ratio with each med.  chronic balance issues.  Dr. Lucia Gaskins evaluated him for this in the past 02/2018 without etiology defined.  Had this problem for years.  Continue Seroquel 450 mg HS  Better with Increase Lorazepam 1.5 mg TID prn is safest option with better managed anxiety  Checked B12, folate 12/28/22 were normal.   serum lamotrigine level was 10.5 on 200 bid.  Better with Reduced lamotrigine 100 mg AM and 200 mg HS to see if balance is better given level of 10.5 on 400 mg daily. TSH low may be cause anxiety.  Send to PCP [facility]  Discussed potential metabolic side effects associated with atypical antipsychotics, as well as potential risk for movement side effects. Advised pt to contact office if movement side effects occur.   We discussed the short-term risks associated with benzodiazepines including sedation and increased fall risk among others.  Discussed long-term side effect risk including dependence, potential withdrawal symptoms, and the potential eventual dose-related risk of dementia.  But recent studies from 2020 dispute this association between benzodiazepines and dementia risk. Newer studies in 2020 do not support an association with dementia.  Disc risk of higher dose.  FU 6 mos  Meredith Staggers, MD, DFAPA    No future appointments.     No orders of the defined types were placed in this encounter.      -------------------------------

## 2023-12-23 ENCOUNTER — Telehealth: Payer: Self-pay | Admitting: Psychiatry

## 2023-12-23 ENCOUNTER — Other Ambulatory Visit: Payer: Self-pay

## 2023-12-23 DIAGNOSIS — F411 Generalized anxiety disorder: Secondary | ICD-10-CM

## 2023-12-23 MED ORDER — LORAZEPAM 1 MG PO TABS
ORAL_TABLET | ORAL | 4 refills | Status: DC
Start: 2023-12-23 — End: 2024-05-11

## 2023-12-23 NOTE — Telephone Encounter (Signed)
 Pt lvm that he needs a refill on his lorazapam 1 mg. He has changed pharmacies. So [facility] [address] [facility] [place]

## 2023-12-23 NOTE — Telephone Encounter (Signed)
 Pt called requesting refill of Lorazepam to   [facility]  Next appt 4/2

## 2023-12-23 NOTE — Telephone Encounter (Signed)
 [facility]. Updated pharmacy profile.

## 2024-01-08 ENCOUNTER — Telehealth: Payer: Self-pay | Admitting: Psychiatry

## 2024-01-08 NOTE — Telephone Encounter (Signed)
Can we do a quantity limit exception for lorazepam? [facility] called and said they had one initiated. [phone] option 2

## 2024-01-09 NOTE — Telephone Encounter (Signed)
Noted received fax, all completed and returned per request.

## 2024-01-14 ENCOUNTER — Telehealth: Payer: Self-pay | Admitting: Psychiatry

## 2024-01-14 NOTE — Telephone Encounter (Signed)
Isaac called and said his prior authorization for Lorazepam. They will only pay for #90 and not 150 pills like they used to. Is there an update on this one? Phone number is [phone].

## 2024-01-16 NOTE — Telephone Encounter (Signed)
 Noted its been faxed twice. Let me call them since I have not received a response back.

## 2024-01-16 NOTE — Telephone Encounter (Signed)
 Addendum to attached message. Reginald Tucker is calling again about the prior auth on Lorazepam . He just wanted to know if we have found out anything about it yet.

## 2024-01-17 NOTE — Telephone Encounter (Signed)
 Contacted [employer] regarding quantity limit for Lorazepam  1 mg #135 for 30 day. They did review the appeal I submitted and an approval has been given effective 01/16/24-12/09/24.   [id number]

## 2024-01-20 NOTE — Telephone Encounter (Signed)
 Patient notified of approval for lorazepam .

## 2024-02-05 ENCOUNTER — Other Ambulatory Visit: Payer: Self-pay

## 2024-02-05 ENCOUNTER — Telehealth: Payer: Self-pay | Admitting: Psychiatry

## 2024-02-05 DIAGNOSIS — F314 Bipolar disorder, current episode depressed, severe, without psychotic features: Secondary | ICD-10-CM

## 2024-02-05 MED ORDER — QUETIAPINE FUMARATE 300 MG PO TABS
450.0000 mg | ORAL_TABLET | Freq: Every day | ORAL | 1 refills | Status: DC
Start: 2024-02-05 — End: 2024-05-11

## 2024-02-05 NOTE — Telephone Encounter (Signed)
 Sent seroquel to [facility].

## 2024-02-05 NOTE — Telephone Encounter (Signed)
 Reginald Tucker called at 9:40 to request refill of his Seroquel.  He has changed pharmacies due to insurance.  Appt 6/2.  Send prescription to [facility] on [address], [place]

## 2024-02-14 DIAGNOSIS — Z87891 Personal history of nicotine dependence: Secondary | ICD-10-CM | POA: Diagnosis not present

## 2024-02-14 DIAGNOSIS — R058 Other specified cough: Secondary | ICD-10-CM | POA: Diagnosis not present

## 2024-02-14 DIAGNOSIS — H6122 Impacted cerumen, left ear: Secondary | ICD-10-CM | POA: Diagnosis not present

## 2024-02-19 ENCOUNTER — Telehealth: Payer: Self-pay | Admitting: Psychiatry

## 2024-02-19 DIAGNOSIS — F3162 Bipolar disorder, current episode mixed, moderate: Secondary | ICD-10-CM

## 2024-02-19 MED ORDER — LAMOTRIGINE 100 MG PO TABS
ORAL_TABLET | ORAL | 0 refills | Status: DC
Start: 2024-02-19 — End: 2024-05-11

## 2024-02-19 NOTE — Telephone Encounter (Signed)
 Lamictal RF sent to [facility].

## 2024-02-19 NOTE — Telephone Encounter (Signed)
 Patient called in stating that all his prescriptions were sent to pharmacy except for his Lamotrigine 100. He needs that sent [facility] [address] [place], [place] Ph: [phone] Appt 6/2

## 2024-03-03 DIAGNOSIS — I10 Essential (primary) hypertension: Secondary | ICD-10-CM | POA: Diagnosis not present

## 2024-03-03 DIAGNOSIS — R058 Other specified cough: Secondary | ICD-10-CM | POA: Diagnosis not present

## 2024-03-03 DIAGNOSIS — E782 Mixed hyperlipidemia: Secondary | ICD-10-CM | POA: Diagnosis not present

## 2024-03-03 DIAGNOSIS — E039 Hypothyroidism, unspecified: Secondary | ICD-10-CM | POA: Diagnosis not present

## 2024-03-03 DIAGNOSIS — R7303 Prediabetes: Secondary | ICD-10-CM | POA: Diagnosis not present

## 2024-03-03 DIAGNOSIS — Z0000 Encounter for general adult medical examination without abnormal findings: Secondary | ICD-10-CM | POA: Diagnosis not present

## 2024-03-03 DIAGNOSIS — E559 Vitamin D deficiency, unspecified: Secondary | ICD-10-CM | POA: Diagnosis not present

## 2024-03-03 DIAGNOSIS — F319 Bipolar disorder, unspecified: Secondary | ICD-10-CM | POA: Diagnosis not present

## 2024-05-11 ENCOUNTER — Encounter: Payer: Self-pay | Admitting: Psychiatry

## 2024-05-11 ENCOUNTER — Ambulatory Visit: Payer: Medicare HMO | Admitting: Psychiatry

## 2024-05-11 DIAGNOSIS — G3184 Mild cognitive impairment, so stated: Secondary | ICD-10-CM

## 2024-05-11 DIAGNOSIS — F900 Attention-deficit hyperactivity disorder, predominantly inattentive type: Secondary | ICD-10-CM

## 2024-05-11 DIAGNOSIS — F3162 Bipolar disorder, current episode mixed, moderate: Secondary | ICD-10-CM | POA: Diagnosis not present

## 2024-05-11 DIAGNOSIS — F411 Generalized anxiety disorder: Secondary | ICD-10-CM

## 2024-05-11 DIAGNOSIS — F314 Bipolar disorder, current episode depressed, severe, without psychotic features: Secondary | ICD-10-CM | POA: Diagnosis not present

## 2024-05-11 MED ORDER — LAMOTRIGINE 100 MG PO TABS: ORAL_TABLET | ORAL | 1 refills | Status: DC

## 2024-05-11 MED ORDER — LORAZEPAM 1 MG PO TABS
ORAL_TABLET | ORAL | 5 refills | Status: DC
Start: 2024-05-11 — End: 2024-11-04

## 2024-05-11 MED ORDER — QUETIAPINE FUMARATE 300 MG PO TABS: 450.0000 mg | ORAL_TABLET | Freq: Every day | ORAL | 1 refills | Status: DC

## 2024-05-11 NOTE — Progress Notes (Signed)
 ------------------------------- [id number] 1956/06/20 68 y.o.  Virtual Visit via Telephone Note  I connected with pt by telephone and verified that I am speaking with the correct person using two identifiers.   I discussed the limitations, risks, security and privacy concerns of performing an evaluation and management service by telephone and the availability of in person appointments. I also discussed with the patient that there may be a patient responsible charge related to this service. The patient expressed understanding and agreed to proceed.  I discussed the assessment and treatment plan with the patient. The patient was provided an opportunity to ask questions and all were answered. The patient agreed with the plan and demonstrated an understanding of the instructions.   The patient was advised to call back or seek an in-person evaluation if the symptoms worsen or if the condition fails to improve as anticipated.  I provided 30 minutes of non-face-to-face time during this encounter. The patient was located at home and the provider was located office. Session from 1130-1200  Subjective:   Patient ID:  Reginald Tucker is a 68 y.o. (DOB 1956-09-20) male.  Chief Complaint:  Chief Complaint  Patient presents with   Follow-up   Depression   Anxiety    [name] presents to the office today for follow-up of TRD and anxiety.  When seen April 06, 2019.  He had not seen any mood benefit from low-dose pramipexole  and had side effects at higher dosages.  Therefore we weaned him off of the that medication.  visit August 07, 2019 and the following changes were made: Option retry Vraylar with selegiline  which wasn't adequately done before.  Prior trial inadequate duration and SE Retry Vraylar at a lower dosage to prevent tremors: 1 capsule every Monday, Wednesday, Friday only. Made him jittery and stopped after a week.  Didn't see mood benefit.  October 2020 was last appointment  and there were no med changes as the patient was going to seek a second opinion.  The following was noted: Still irritable and real tired.  Easily fatigued with normal activity.  Appt with PCP Oct 27 to evaluate. Not much tremor. Waves of depression worse in the morning.  Sleeping too much in recliner and lethargic.  Deep sleep.  Takes 60 min to fall asleep.  Cant' shut off his brain and hard to fall asleep.  Reads a lot and enjoys it especially history.  Pt reports that mood is Anxious, Depressed and Irritable rated 7/10 bad and describes anxiety as Moderate and occassional.  Less anxiety this visit than depression..  Staying in the house is making it worse too.   Anxiety symptoms include: Excessive Worry, Panic Symptoms, Social Anxiety,. Has claustrophobia.  Hard to ride elevators .  Can't go up the steps here.  Pt reports no sleep issues. Pt reports that appetite is good. Pt reports that energy is poor and anhedonia, loss of interest or pleasure in usual activities, poor motivation and withdrawn from usual activities. Concentration is down slightly. Suicidal thoughts:  denied by patient.  Still has days that does very little.  Will clean occ.   10/17/2020 appointment with the following noted:  Wife joined Patient has requested refills from this location but has not been seen here in a year which is not appropriate given his level of instability historically. Didn't go for second opinion since here.   Still doing about the same without much change.  Some days are better than others. Wife says he does some things  and will go out sometimes.  Doesn't want to go out at dark.  Thinks winter is the worst time. Asked about TMS. Tolerating meds OK.   Chronically depressed and easily irritable, without pattern except brief. Wife says some weeks are worse than others. No walking or exercise.  Balance issues.  Was better when did PT but he quit the exercises. Patient is highly claustrophobic and states is  getting worse.  He is having trouble using the elevator to get to our office.  He asked about finding another psychiatrist who has an office on the first floor and we discussed options.  Normal card review recently.  02/21/21 appt noted: W involved in call. Very sick with covid pneumonia and nearly died in 01/09/2024 and still recovering. Doing breathing exercises and some walking.  Weak in am. A lot of depression in hsopital thinking he would die.  Now dep 7/10.   No change in mood off selegiline  per pt or Christiane Cowing but she's not sure. Concerta  didn't help and they agree.  He stopped it and went back on selegiline . Memory is bad. Taking melatonin 10 comes and goes with benefit.  Takes 1 and 1/2 hours to go to sleep. To bed 1110 and up at 9-930 Plan: DC selegiline    Wait 5 days to clear and start [facility] ER 36 mg each AM.   If NR after 2 weeks, then ER increase if they call.  03/29/2021 phone call from wife reporting that Caplyta  was working and they wanted more samples while we worked on a PA  04/24/2021 appointment with the following noted:  Wife on session also Took [facility] and couldn't tell a difference except wife said he was more anxious.  Stopped it. Caplyta  42 mg daily and now says he's worse.  Not doing well at all.  Wonders if he's worse bc of the reduction in Seroquel .  Going through hell for 3-4 weeks with 8/10 depression.  No SI. Getting up earlier. More anxiety and hard to lay down. Came to a head with wife saying she's leaving for 24 days to help her daughter with a child.  She was gone 10 days in [facility].  Inconsistency in life.  Better if going to church but usually doesn't do it. Got saved my life from Covid Plan: DC Caplyta  DT failure. Return to Seroquel  400 bc worse without it. He wants to use risperidone  1 mg BID prn anxiety.  06/21/2021 appointment with the following noted:  Wife on call Only increased Seroquel  to 300 mg HS. Doing great and about 3/10 depression.  Started improving pretty  quickly.  Went back to church and out with friends.  W gone 3 weeks.  Going to Bible study.  Got together with friends he hasn't seen  in 4 years. I feel good. No SE. Sleep good usually.  Not sleeping excessively as much as in the past about 8-9 hours.  Anxiety is etter not gone.   Trying to walk without a cane.  Recent PCP was unremarkable.  Lost to 218#.    09/26/21 appt noted: wife also on call Great.  Gym 5-6 days per week. Lost 25#.  Getting out more.  Involved in church.  Feels better than last time and more active and socializing.  Mostly out of depression 1-2/10. No SE.  Better with queiapine 300 vs 400 re: sedation. Wife agrees he's dramatically better all around. Needs and gets 8-9 hours nightly and 2 brief naps daily. Anxiety is manageable and risperidone  helps.  Risperidone  also helps keep down the level of irritability which is occasional and manageable. Plan: Continue Seroquel  300 mg HS Continue risperidone  1 mg twice daily as he is feels it is helping Continue lamotrigine  200 mg twice daily   01/23/22 appt noted: Still doing fine with depression with some seasonal blues.  Involved in couple of Bible studies.  Walking and reading books.  Getting out and doing things.  Men's and couples conferences. [employer]. Does chores.  Good health. Depression 2/10.  Sleep pretty good with occ initial insomnia.  Restorative.   No SE problems Plan: No change indicated.   Continue Seroquel  300 mg HS Continue risperidone  1 mg twice daily as he is feels it is helping Continue lamotrigine  200 mg twice daily  06/26/22 appt noted: I con't have any depression and anxiety No complaints.  Active at church.   Slowed DT hernia.  Surg 8/4 bc bothers him.  Curtailed physcial activity. No SE. Hard to shut off brain at night.  Reads bible.  Takes awhile to go to sleep.  Chronic problem.  Sleep from 1-8 but would like to sleep more. Plan: No med changes  12/27/2022 appointment  noted: CC anxiety more than SE concerns On lamotrigine  and Seroquel  and lorazepam  Ran out of risperidone  for a a couple of months had trouble with refill. Wife looked up SE meds. Asks about SE each.  Disc balance issues.  Wife concerned about his memory and balance. Not sedated.  No falls lately.  Is having some memory issues.  STM issues. Everything is ok with quetiapine . Sometimes problems with balance.  Long term issue.  Can sometimes shuffle feet.   Mood for the most part steady with occ flare ups . Anxiety not well controlled.  Taking more lorazepam  lately.  Lasts about 8 hours. About 2 daily.  Anxiety picked up 5-6 months ago.  No pressure on him.  No reason for the anxiety. Plan: Continue Seroquel  300 mg HS Continue risperidone  1 mg twice daily as he is feels it is helping Continue lamotrigine  200 mg twice daily Lorazepam  0.5 mg TIDprn is safest option Check B12, folate, serum lamotrigine  level Come to office to evaluate balance issue and for EPS  12/31/22 urgent appt noted:  seen with wife, seen in office medS: lamotrigine  200 mg BID, quetiapine   300 mg HS, been off risperidone  for several months. 2-3 times a week feels anxiety is out of control and takes prn quetiapine  100 mg with anxiety. CC balance issues.  Not dizzy or lightheadedness.  Occ tremor.  No recent falls Sleep 8-9 hours night and nap but he disputes.  Wife says he lays in bed a couple of times daily.  She says it's more than he does. Irritable 3-4/10.  Not lately depressed.  No other manic sx.  When leaves house very anxious, almost housebound.  Sitts and watches TV.  Will go to grocery.  Not going to church for months DT anxiety.   Enjoyed family coming to the house.   Hand shaking about a month or so. Sometimes wife says speech mildly slurred.  No jerks.   Redness of face and saw derm and gave a cream.  He doesn't know the dx. No exercise.   Plan: Continue Seroquel  300 mg HS Reduce lamotrigine  100 mg AM and 200  mg HS to see if balance is better given level of 10.5 Lorazepam  0.5 mg TID prn is safest option Checked B12, folate 12/28/22 were normal.   serum lamotrigine   level was 10.5 on 200 bid. TSH low may be cause anxiety.  Send to PCP Raquel Cables Come to office to evaluate balance issue and for EPS  03/04/23 appt noted:  Good and more active.  Getting out more. Doesn't feel lorazepam  is high enough.  Needs 1 mg in AM and then needs more in the afternoon.Aaron Aas  a lot going on in the brain with antsy feelings.  Anxiety is not well controlled.    No SE with meds. Balance is better.  No leg jerking.  Sleep is pretty good overall, usually quicker to sleep, more refreshed.  Plan:  Increase Lorazepam  1 mg TID prn is safest option vs switch to clonazepam for unmanaged anxiety  05/07/23 appt noted: Meds: Seroquel  450 HS, lamotrigine  100 AM & 200 PM, lorazepam  1 mg TID. For the most part it is working with more lorazepam .  Some anxiety in the pm. Washed and waxed cars.  More active as much as possible.  Active. No pattern for down days. Sleep unchanged with initial ins.  In bed 10 1/2 hours.   No SE.   Feels blessed.   Satisfied with meds.   Had to put one of the dogs to sleep yesterday.    11/11/23 appt noted:  Called in summer to incr lorazepam .   Meds: lorazepam  2 mg Am and 1 mg later and then 1.5 mg PM,  Seroquel  450 HS, lamotrigine  100 AM & 200 PM, Sleep good with quetiapine .  Not sure why ran out of it early but has 90 days supply now.   For the most part mood is pretty stable.   Anxiety better with meds. Walks regularly and exercise.  Grocery shopping.  Will rake leaves Wt 210# but feels good.   No sleepiness with meds during the day.   No SE with meds. Plan no change  05/11/24 appt noted: Med: as above Doesn't feel like lorazepam  as effective for anxiety.  Late afternoon.  Slep is pretty good with less initial ins.  No napping.   No dep nor mania.   No SE .   Not sleepy or dizzy but more relaxed on  lorazepam . Mood stable without swings. Walking , washing car, mowing grass. Good physically Plan: Plan: increase lorazepam  1 mg 2 tablets in the morning and afternoon, and 1 and 1/2 tablets at night.  Sleep study negative for OSA noted on chart.  Past Psychiatric Medication Trials: He has had multiple psych med failures as well .   Past psychiatric medications used include pramipexole  NR, sertraline,  Trintellix 1.5 mg MWF jittery,  selegiline  max 30mg  daily, Paxil, Wellbutrin, buspirone,   lamotrigine , carbamazepine, lithium with a tremor,  Depakote was side effects of feeling heavy and increased ammonia,    Seroquel  800 mg a day, Latuda 120 mg a day,  Rexulti,  Perphenazine,  Vraylar 3mg  akathisia, He did not tolerate Vraylar even at 1.5 mg 3 days a week. risperidone , olanzapine, quetiapine  Caplyta  42 NR  modafinil,  Adderall with selegiline  with BP problems, Concerta  54 NR Light therapy failure. ECT twice, failed 2nd time. Affectively better  after adding pramipexole  to 0.5 mg twice daily and increasing Seroquel  to 600 mg daily but lost response apparently soon thereafter.  Psychiatric 2nd opinion: Dr. Matha Solo  Review of Systems:  Review of Systems  Constitutional:  Negative for fatigue.  Respiratory:  Positive for shortness of breath.   Cardiovascular:  Negative for chest pain.  Gastrointestinal:  Positive for abdominal pain.  Musculoskeletal:  Positive for back pain.  Psychiatric/Behavioral:  Negative for agitation, behavioral problems, confusion, decreased concentration, dysphoric mood, hallucinations, self-injury, sleep disturbance and suicidal ideas. The patient is nervous/anxious. The patient is not hyperactive.   Occurs 2-3 times/weekre: leg weakness.  Not orthostatic.  Medications: I have reviewed the patient's current medications.  Current Outpatient Medications  Medication Sig Dispense Refill   ascorbic acid (VITAMIN C) 1000 MG tablet Take 1,000 mg by mouth  daily.     aspirin EC 81 MG tablet Take 81 mg by mouth daily.     Cholecalciferol (VITAMIN D3) 25 MCG (1000 UT) CAPS Take 1,000 Units by mouth daily.     L-THEANINE PO Take by mouth.     levothyroxine  (SYNTHROID ) 175 MCG tablet Take 175 mcg by mouth daily before breakfast.     lovastatin (MEVACOR) 40 MG tablet Take 40 mg by mouth daily.      MAGNESIUM PO Take 3 tablets by mouth daily. 3750 mg total     Omega-3 Fatty Acids (SUPER OMEGA 3 PO) Take 1 capsule by mouth daily.     omeprazole (PRILOSEC) 20 MG capsule Take 20 mg by mouth daily.     vitamin B-12 (CYANOCOBALAMIN ) 50 MCG tablet Take 50 mcg by mouth daily.     zinc gluconate 50 MG tablet Take 50 mg by mouth daily.     lamoTRIgine  (LAMICTAL ) 100 MG tablet TAKE 1 TABLET BY MOUTH EVERY MORNING AND 2 TABLETS AT NIGHT 270 tablet 1   LORazepam  (ATIVAN ) 1 MG tablet Take 2 tablets in the morning and afternoon, and 1 and 1/2 tablets at night 165 tablet 5   QUEtiapine  (SEROQUEL ) 300 MG tablet Take 1.5 tablets (450 mg total) by mouth at bedtime. 135 tablet 1   No current facility-administered medications for this visit.    Medication Side Effects:got nervous and irritable with 1.5 mg pramipexole  in morning, ok with it split up and no change in mood.  Allergies:  Allergies  Allergen Reactions   Lithium     Makes pt jittery/causes anxiety    Ambien [Zolpidem Tartrate] Anxiety    Nervous, uncontrollable   Sulfa Antibiotics Other (See Comments)    UNSPECIFIED REACTION OF CHILDHOOD   Sulfamethoxazole Other (See Comments)    UNSPECIFIED REACTION OF CHILDHOOD    Past Medical History:  Diagnosis Date   Acquired hallux rigidus of right foot 04/26/2017   AKI (acute kidney injury) (HCC) 06/11/2017   Anxiety    Bipolar 1 disorder (HCC)    BMI 37.0-37.9, adult    BPH (benign prostatic hyperplasia)    Cataract    Cataract    L eye   CKD (chronic kidney disease), stage III (HCC)    Colon polyps    COPD GOLD II with restrictive component   01/13/2016   Spirometry 01/13/2016  FEV1 1.84 (47%)  Ratio 62  - 01/13/2016  extensive coaching HFA effectiveness =    90% > try stiolto respimat  2 pffs each am > did not benefit so stopped when sample out - 01/13/2016  Walked RA x 3 laps @ 185 ft each stopped due to  End of study, nl pace, no desat  / min sob  - PFT's  03/16/2016  FEV1 2.28 (59 % ) ratio 67  p 12 % improvement from saba p no prior to study with DLCO  66 % corrects to 86 % for alv volume      Depression    Dysrhythmia    Essential hypertension 01/19/2015  Family history of coronary arteriosclerosis 01/19/2015   Father with MI    Fatty liver    GERD (gastroesophageal reflux disease) 08/11/2014   History of colon polyps 06/18/2017   Hyperlipidemia    Hypertension    Hypothyroidism 08/11/2014   Hypothyroidism    Insomnia 06/18/2017   Insomnia    Memory change    Mixed hyperlipidemia 06/18/2017   Morbid obesity (HCC) 03/18/2016   Complicated by HBP/ Low erv on pfts 03/16/2016 (31%)     Morbid obesity due to excess calories (HCC)    Prediabetes    Sleep apnea    SOB (shortness of breath)    Thyroid  disease    Tinnitus of both ears 06/18/2017   Tobacco use 06/18/2017   Unsteadiness on feet    Vitamin D deficiency 06/18/2017   Vitamin D deficiency     Family History  Problem Relation Age of Onset   Cancer Mother    Hyperlipidemia Father    Hypertension Father    CAD Father    Stroke Father    Aortic aneurysm Father    Prostate cancer Father    Hyperlipidemia Brother    Appendicitis Maternal Grandfather     Social History   Socioeconomic History   Marital status: Married    Spouse name: Not on file   Number of children: Not on file   Years of education: Not on file   Highest education level: Associate degree: occupational, Scientist, product/process development, or vocational program  Occupational History   Occupation: act.  assist  Tobacco Use   Smoking status: Former    Current packs/day: 0.00    Average packs/day: 1 pack/day for 25.0 years (25.0 ttl  pk-yrs)    Types: Cigarettes    Start date: 12/11/1975    Quit date: 12/10/2000    Years since quitting: 23.4   Smokeless tobacco: Never   Tobacco comments:    heavy vape user- quit vaping 06/04/2017  Vaping Use   Vaping status: Former  Substance and Sexual Activity   Alcohol use: No    Alcohol/week: 0.0 standard drinks of alcohol   Drug use: No   Sexual activity: Not on file  Other Topics Concern   Not on file  Social History Narrative   Admitted to [facility] 06/14/17- discharged   Lives at home with his wife   Married - Christiane Cowing   Former smoker - stopped 2002   Alcohol none   Full code   Right handed   Drinks 2 cups of caffeine daily   Social Drivers of Corporate investment banker Strain: Not on file  Food Insecurity: Not on file  Transportation Needs: Not on file  Physical Activity: Not on file  Stress: Not on file  Social Connections: Not on file  Intimate Partner Violence: Not on file    Past Medical History, Surgical history, Social history, and Family history were reviewed and updated as appropriate.   Please see review of systems for further details on the patient's review from today.   Objective:   Physical Exam:  There were no vitals taken for this visit.  Physical Exam Neurological:     Mental Status: He is alert and oriented to person, place, and time.     Cranial Nerves: No dysarthria.  Psychiatric:        Attention and Perception: Attention and perception normal.        Mood and Affect: Mood is anxious. Mood is not depressed.  Speech: Speech normal.        Behavior: Behavior is cooperative.        Thought Content: Thought content normal. Thought content is not paranoid or delusional. Thought content does not include homicidal or suicidal ideation. Thought content does not include suicidal plan.        Cognition and Memory: Cognition and memory normal.        Judgment: Judgment normal.     Comments: Insight intact    Lab Review:     Component  Value Date/Time   NA 147 (H) 05/15/2023 1550   K 5.0 05/15/2023 1550   CL 105 05/15/2023 1550   CO2 27 05/15/2023 1550   GLUCOSE 96 05/15/2023 1550   GLUCOSE 118 (H) 02/21/2022 1054   BUN 16 05/15/2023 1550   CREATININE 1.02 05/15/2023 1550   CALCIUM 10.1 05/15/2023 1550   PROT 6.9 02/11/2018 0623   ALBUMIN 4.2 02/11/2018 0623   AST 25 02/11/2018 0623   ALT 33 02/11/2018 0623   ALKPHOS 89 02/11/2018 0623   BILITOT 0.9 02/11/2018 0623   GFRNONAA >60 02/21/2022 1054   GFRAA >60 08/13/2018 1020       Component Value Date/Time   WBC 5.5 02/21/2022 1054   RBC 5.34 02/21/2022 1054   HGB 16.7 02/21/2022 1054   HCT 50.7 02/21/2022 1054   PLT 204 02/21/2022 1054   MCV 94.9 02/21/2022 1054   MCH 31.3 02/21/2022 1054   MCHC 32.9 02/21/2022 1054   RDW 12.4 02/21/2022 1054   LYMPHSABS 1.3 08/13/2018 1020   MONOABS 0.7 08/13/2018 1020   EOSABS 0.1 08/13/2018 1020   BASOSABS 0.1 08/13/2018 1020    No results found for: "POCLITH", "LITHIUM"   No results found for: "PHENYTOIN", "PHENOBARB", "VALPROATE", "CBMZ"   12/28/22      Component Ref Range & Units 3 d ago  Lamotrigine  Lvl 2.5 - 15.0 mcg/mL 10.8    On 200 mg BID    .res Assessment: Plan:    Severe bipolar I disorder with depression (HCC) - Plan: QUEtiapine  (SEROQUEL ) 300 MG tablet  Generalized anxiety disorder - Plan: LORazepam  (ATIVAN ) 1 MG tablet  Mild cognitive impairment  Attention deficit hyperactivity disorder (ADHD), predominantly inattentive type  Bipolar 1 disorder, mixed, moderate (HCC) - Plan: lamoTRIgine  (LAMICTAL ) 100 MG tablet    Severe TRD and anxiety.   Seasonal pattern of depression in bipolar is not uncommon   Disc history of TRD.  He is markedly better at this point on quetiapine  300 mg nightly and lamotrigine  300 mg daily  Anxiety is improved not resolved with lorazepam  1.5 mg TID  Also we discussed at length that the increased and his physical and social activity and spiritual activity  is clearly having a marked positive effect on his mood as well.  It is unlikely for medication alone to maintain him with regard to his mood.  He has a history of multiple recurrences and very unstable bipolar disorder.  He agrees to try to keep continue his positive social and physical behaviors.  He is also added gym 5 days a week which is clearly helping.  Disc each med and SE issues.  Disc purpose of each.  There is no way to treat his history of psych problems without meds that have potential SE in these areas.   Disc balancing benefit / SE ratio with each med.  chronic balance issues.  Dr. Tresia Fruit evaluated him for this in the past 02/2018 without etiology defined.  Had this  problem for years.  Continue Seroquel  450 mg HS  Checked B12, folate 12/28/22 were normal.   serum lamotrigine  level was 10.5 on 200 bid. Better with Reduced lamotrigine  100 mg AM and 200 mg HS to see if balance is better given level of 10.5 on 400 mg daily. TSH low may be cause anxiety.  Send to PCP [facility]  Discussed potential metabolic side effects associated with atypical antipsychotics, as well as potential risk for movement side effects. Advised pt to contact office if movement side effects occur.   We discussed the short-term risks associated with benzodiazepines including sedation and increased fall risk among others.  Discussed long-term side effect risk including dependence, potential withdrawal symptoms, and the potential eventual dose-related risk of dementia.  But recent studies from 2020 dispute this association between benzodiazepines and dementia risk. Newer studies in 2020 do not support an association with dementia.  Disc risk of higher dose.  Limit caffeine  Plan: increase lorazepam  1 mg 2 tablets in the morning and afternoon, and 1 and 1/2 tablets at night. He's aware we cannot continue to go up.  Approaching max dose.  FU 6 mos  Nori Beat, MD, DFAPA    No future appointments.     No  orders of the defined types were placed in this encounter.      -------------------------------

## 2024-05-12 ENCOUNTER — Telehealth: Payer: Self-pay | Admitting: Psychiatry

## 2024-05-12 NOTE — Telephone Encounter (Signed)
 Patient called in regarding his Lorazepam  prescription. States that at appt with CC 6/2 that his prescription was upped and is now taking 5 1/2 tablets a day instead of 4 1/2. He needs new prescription sent to pharmacy. Ph: [phone] Appt 12/3 Pharmacy [facility] [address] [place], [place]

## 2024-05-12 NOTE — Telephone Encounter (Signed)
 Pharmacy said it needs a PA for qty. Traci notified.

## 2024-05-12 NOTE — Telephone Encounter (Addendum)
 Rx was sent at visit yesterday. Patient said pharmacy is telling him it is too soon to fill. Will call pharmacy. Since it is a dose increase should not be an issue.   LF 5/7 for #135 for 30 days.

## 2024-05-13 NOTE — Telephone Encounter (Signed)
 Contacted [employer] yesterday evening with a lengthy call but there were communication difficulty with the representative and could not process any further on the phone. Pt previously had a PA on file for quantity limit of 4.5 tablets daily but Dr. Toi Foster increased dose on 06/02 for 5.5 tablets daily. Unable to process electronically through [facility], response was PA already on file. After phone call with representative decided to fax paperwork this morning requesting an approval in the increased quantity along with office notes to [employer] [phone]. Submitted for an urgent review within 24 hours.

## 2024-05-13 NOTE — Telephone Encounter (Signed)
 [employer] responded via fax with approval on Lorazepam  1 mg tablet #165/30 day effective 05/13/2024-12/09/2024   ZO#[id number]

## 2024-05-13 NOTE — Telephone Encounter (Signed)
 Patient notified of approval. Pharmacy notified and asked them to fill today. Will be ready later this afternoon.

## 2024-06-08 DIAGNOSIS — F319 Bipolar disorder, unspecified: Secondary | ICD-10-CM | POA: Diagnosis not present

## 2024-06-08 DIAGNOSIS — E782 Mixed hyperlipidemia: Secondary | ICD-10-CM | POA: Diagnosis not present

## 2024-06-08 DIAGNOSIS — E039 Hypothyroidism, unspecified: Secondary | ICD-10-CM | POA: Diagnosis not present

## 2024-06-08 DIAGNOSIS — I10 Essential (primary) hypertension: Secondary | ICD-10-CM | POA: Diagnosis not present

## 2024-06-15 DIAGNOSIS — Z85828 Personal history of other malignant neoplasm of skin: Secondary | ICD-10-CM | POA: Diagnosis not present

## 2024-06-15 DIAGNOSIS — L821 Other seborrheic keratosis: Secondary | ICD-10-CM | POA: Diagnosis not present

## 2024-06-30 DIAGNOSIS — S8990XA Unspecified injury of unspecified lower leg, initial encounter: Secondary | ICD-10-CM | POA: Diagnosis not present

## 2024-07-09 DIAGNOSIS — E039 Hypothyroidism, unspecified: Secondary | ICD-10-CM | POA: Diagnosis not present

## 2024-07-09 DIAGNOSIS — E782 Mixed hyperlipidemia: Secondary | ICD-10-CM | POA: Diagnosis not present

## 2024-07-09 DIAGNOSIS — F319 Bipolar disorder, unspecified: Secondary | ICD-10-CM | POA: Diagnosis not present

## 2024-07-09 DIAGNOSIS — I10 Essential (primary) hypertension: Secondary | ICD-10-CM | POA: Diagnosis not present

## 2024-07-17 DIAGNOSIS — M1711 Unilateral primary osteoarthritis, right knee: Secondary | ICD-10-CM | POA: Diagnosis not present

## 2024-07-17 DIAGNOSIS — M79604 Pain in right leg: Secondary | ICD-10-CM | POA: Diagnosis not present

## 2024-08-09 DIAGNOSIS — I10 Essential (primary) hypertension: Secondary | ICD-10-CM | POA: Diagnosis not present

## 2024-08-09 DIAGNOSIS — F319 Bipolar disorder, unspecified: Secondary | ICD-10-CM | POA: Diagnosis not present

## 2024-08-09 DIAGNOSIS — E039 Hypothyroidism, unspecified: Secondary | ICD-10-CM | POA: Diagnosis not present

## 2024-08-09 DIAGNOSIS — E782 Mixed hyperlipidemia: Secondary | ICD-10-CM | POA: Diagnosis not present

## 2024-08-23 ENCOUNTER — Ambulatory Visit
Admission: EM | Admit: 2024-08-23 | Discharge: 2024-08-23 | Disposition: A | Discharge status: Home / Self Care | Attending: Family Medicine | Admitting: Family Medicine

## 2024-08-23 DIAGNOSIS — Z711 Person with feared health complaint in whom no diagnosis is made: Secondary | ICD-10-CM | POA: Diagnosis not present

## 2024-08-23 NOTE — ED Provider Notes (Signed)
 [facility]  Note:  This document was prepared using Conservation officer, historic buildings and may include unintentional dictation errors.  MRN: [id number] DOB: Apr 01, 1956  Subjective:   Reginald Tucker is a 68 y.o. male presenting for concern of retained foreign body in his right ear.  Patient reports that when he removed his hearing aid, he did not see the end of the earpiece attached.  No pain, drainage of pus or bleeding.  No current facility-administered medications for this encounter.  Current Outpatient Medications:    ascorbic acid (VITAMIN C) 1000 MG tablet, Take 1,000 mg by mouth daily., Disp: , Rfl:    aspirin EC 81 MG tablet, Take 81 mg by mouth daily., Disp: , Rfl:    Cholecalciferol (VITAMIN D3) 25 MCG (1000 UT) CAPS, Take 1,000 Units by mouth daily., Disp: , Rfl:    L-THEANINE PO, Take by mouth., Disp: , Rfl:    lamoTRIgine  (LAMICTAL ) 100 MG tablet, TAKE 1 TABLET BY MOUTH EVERY MORNING AND 2 TABLETS AT NIGHT, Disp: 270 tablet, Rfl: 1   levothyroxine  (SYNTHROID ) 175 MCG tablet, Take 175 mcg by mouth daily before breakfast., Disp: , Rfl:    LORazepam  (ATIVAN ) 1 MG tablet, Take 2 tablets in the morning and afternoon, and 1 and 1/2 tablets at night, Disp: 165 tablet, Rfl: 5   lovastatin (MEVACOR) 40 MG tablet, Take 40 mg by mouth daily. , Disp: , Rfl:    MAGNESIUM PO, Take 3 tablets by mouth daily. 3750 mg total, Disp: , Rfl:    Omega-3 Fatty Acids (SUPER OMEGA 3 PO), Take 1 capsule by mouth daily., Disp: , Rfl:    omeprazole (PRILOSEC) 20 MG capsule, Take 20 mg by mouth daily., Disp: , Rfl:    QUEtiapine  (SEROQUEL ) 300 MG tablet, Take 1.5 tablets (450 mg total) by mouth at bedtime., Disp: 135 tablet, Rfl: 1   vitamin B-12 (CYANOCOBALAMIN ) 50 MCG tablet, Take 50 mcg by mouth daily., Disp: , Rfl:    zinc gluconate 50 MG tablet, Take 50 mg by mouth daily., Disp: , Rfl:    Allergies  Allergen Reactions   Lithium     Makes pt jittery/causes anxiety     Ambien [Zolpidem Tartrate] Anxiety    Nervous, uncontrollable   Sulfa Antibiotics Other (See Comments)    UNSPECIFIED REACTION OF CHILDHOOD   Sulfamethoxazole Other (See Comments)    UNSPECIFIED REACTION OF CHILDHOOD    Past Medical History:  Diagnosis Date   Acquired hallux rigidus of right foot 04/26/2017   AKI (acute kidney injury) (HCC) 06/11/2017   Anxiety    Bipolar 1 disorder (HCC)    BMI 37.0-37.9, adult    BPH (benign prostatic hyperplasia)    Cataract    Cataract    L eye   CKD (chronic kidney disease), stage III (HCC)    Colon polyps    COPD GOLD II with restrictive component  01/13/2016   Spirometry 01/13/2016  FEV1 1.84 (47%)  Ratio 62  - 01/13/2016  extensive coaching HFA effectiveness =    90% > try stiolto respimat  2 pffs each am > did not benefit so stopped when sample out - 01/13/2016  Walked RA x 3 laps @ 185 ft each stopped due to  End of study, nl pace, no desat  / min sob  - PFT's  03/16/2016  FEV1 2.28 (59 % ) ratio 67  p 12 % improvement from saba p no prior to study with DLCO  66 %  corrects to 86 % for alv volume      Depression    Dysrhythmia    Essential hypertension 01/19/2015   Family history of coronary arteriosclerosis 01/19/2015   Father with MI    Fatty liver    GERD (gastroesophageal reflux disease) 08/11/2014   History of colon polyps 06/18/2017   Hyperlipidemia    Hypertension    Hypothyroidism 08/11/2014   Hypothyroidism    Insomnia 06/18/2017   Insomnia    Memory change    Mixed hyperlipidemia 06/18/2017   Morbid obesity (HCC) 03/18/2016   Complicated by HBP/ Low erv on pfts 03/16/2016 (31%)     Morbid obesity due to excess calories (HCC)    Prediabetes    Sleep apnea    SOB (shortness of breath)    Thyroid  disease    Tinnitus of both ears 06/18/2017   Tobacco use 06/18/2017   Unsteadiness on feet    Vitamin D deficiency 06/18/2017   Vitamin D deficiency      Past Surgical History:  Procedure Laterality Date   COLONOS     COLONSCOPY     CYST EXCISION   12/24/2016   sebaceous cyst chest wall  Dr. Kimble   LAPAROSCOPIC APPENDECTOMY N/A 03/02/2022   Procedure: LAPAROSCOPIC APPENDECTOMY, PARTIAL RESECTION OF CECIAL WALL;  Surgeon: Sheldon Standing, MD;  Location: [facility];  Service: General;  Laterality: N/A;   MASS EXCISION Left 12/24/2016   Procedure: EXCISION LEFT CHEST WALL SEBACEOUS CYST;  Surgeon: Lynwood Kimble, MD;  Location: [facility];  Service: General;  Laterality: Left;   RADIOLOGY WITH ANESTHESIA N/A 02/11/2018   Procedure: MRI OF BRAIN WITHOUT CONTRAST AND LUMBER SPINE AND CERVICAL;  Surgeon: Radiologist, Medication, MD;  Location: [facility];  Service: Radiology;  Laterality: N/A;   root tip removal     TOE FUSION Right 2018   UMBILICAL HERNIA REPAIR N/A 08/18/2018   Procedure: Umbilical Hernia Repair;  Surgeon: Kimble Lynwood, MD;  Location: [facility];  Service: General;  Laterality: N/A;   WISDOM TOOTH EXTRACTION      Family History  Problem Relation Age of Onset   Cancer Mother    Hyperlipidemia Father    Hypertension Father    CAD Father    Stroke Father    Aortic aneurysm Father    Prostate cancer Father    Hyperlipidemia Brother    Appendicitis Maternal Grandfather     Social History   Tobacco Use   Smoking status: Former    Current packs/day: 0.00    Average packs/day: 1 pack/day for 25.0 years (25.0 ttl pk-yrs)    Types: Cigarettes    Start date: 12/11/1975    Quit date: 12/10/2000    Years since quitting: 23.7   Smokeless tobacco: Never   Tobacco comments:    heavy vape user- quit vaping 06/04/2017  Vaping Use   Vaping status: Former  Substance Use Topics   Alcohol use: No    Alcohol/week: 0.0 standard drinks of alcohol   Drug use: No    ROS   Objective:   Vitals: BP (!) 166/107 (BP Location: Left Arm)   Pulse (!) 108   Temp 98.4 F (36.9 C) (Oral)   Resp 20   SpO2 94%   Physical Exam Constitutional:      General: He is not in acute distress.    Appearance: Normal appearance. He is  well-developed and normal weight. He is not ill-appearing, toxic-appearing or diaphoretic.  HENT:     Head:  Normocephalic and atraumatic.     Right Ear: Tympanic membrane, ear canal and external ear normal. There is no impacted cerumen.     Left Ear: Tympanic membrane, ear canal and external ear normal. There is no impacted cerumen.     Nose: Nose normal.     Mouth/Throat:     Pharynx: Oropharynx is clear.  Eyes:     General: No scleral icterus.       Right eye: No discharge.        Left eye: No discharge.     Extraocular Movements: Extraocular movements intact.  Cardiovascular:     Rate and Rhythm: Normal rate.  Pulmonary:     Effort: Pulmonary effort is normal.  Musculoskeletal:     Cervical back: Normal range of motion.  Neurological:     Mental Status: He is alert and oriented to person, place, and time.  Psychiatric:        Mood and Affect: Mood normal.        Behavior: Behavior normal.        Thought Content: Thought content normal.        Judgment: Judgment normal.     Assessment and Plan :   PDMP not reviewed this encounter.  1. Feared condition not demonstrated    No foreign body visualized, unremarkable ENT exam.  Anticipatory guidance provided.   Christopher Savannah, [place] 08/26/24 [phone]

## 2024-08-23 NOTE — ED Triage Notes (Signed)
 Pt reports he feels  he has a piece of his hearing aids right ear x 20-30 min. Denies pain

## 2024-09-08 DIAGNOSIS — F319 Bipolar disorder, unspecified: Secondary | ICD-10-CM | POA: Diagnosis not present

## 2024-09-08 DIAGNOSIS — E039 Hypothyroidism, unspecified: Secondary | ICD-10-CM | POA: Diagnosis not present

## 2024-09-08 DIAGNOSIS — E782 Mixed hyperlipidemia: Secondary | ICD-10-CM | POA: Diagnosis not present

## 2024-09-08 DIAGNOSIS — I10 Essential (primary) hypertension: Secondary | ICD-10-CM | POA: Diagnosis not present

## 2024-09-09 DIAGNOSIS — F319 Bipolar disorder, unspecified: Secondary | ICD-10-CM | POA: Diagnosis not present

## 2024-09-09 DIAGNOSIS — G20C Parkinsonism, unspecified: Secondary | ICD-10-CM | POA: Diagnosis not present

## 2024-09-09 DIAGNOSIS — R7303 Prediabetes: Secondary | ICD-10-CM | POA: Diagnosis not present

## 2024-09-09 DIAGNOSIS — I10 Essential (primary) hypertension: Secondary | ICD-10-CM | POA: Diagnosis not present

## 2024-09-09 DIAGNOSIS — E782 Mixed hyperlipidemia: Secondary | ICD-10-CM | POA: Diagnosis not present

## 2024-09-09 DIAGNOSIS — K219 Gastro-esophageal reflux disease without esophagitis: Secondary | ICD-10-CM | POA: Diagnosis not present

## 2024-09-09 DIAGNOSIS — E039 Hypothyroidism, unspecified: Secondary | ICD-10-CM | POA: Diagnosis not present

## 2024-10-09 DIAGNOSIS — L738 Other specified follicular disorders: Secondary | ICD-10-CM | POA: Diagnosis not present

## 2024-10-09 DIAGNOSIS — D225 Melanocytic nevi of trunk: Secondary | ICD-10-CM | POA: Diagnosis not present

## 2024-10-09 DIAGNOSIS — I10 Essential (primary) hypertension: Secondary | ICD-10-CM | POA: Diagnosis not present

## 2024-10-09 DIAGNOSIS — F319 Bipolar disorder, unspecified: Secondary | ICD-10-CM | POA: Diagnosis not present

## 2024-10-09 DIAGNOSIS — L570 Actinic keratosis: Secondary | ICD-10-CM | POA: Diagnosis not present

## 2024-10-09 DIAGNOSIS — Z85828 Personal history of other malignant neoplasm of skin: Secondary | ICD-10-CM | POA: Diagnosis not present

## 2024-10-09 DIAGNOSIS — E039 Hypothyroidism, unspecified: Secondary | ICD-10-CM | POA: Diagnosis not present

## 2024-10-09 DIAGNOSIS — L218 Other seborrheic dermatitis: Secondary | ICD-10-CM | POA: Diagnosis not present

## 2024-10-09 DIAGNOSIS — E782 Mixed hyperlipidemia: Secondary | ICD-10-CM | POA: Diagnosis not present

## 2024-10-09 DIAGNOSIS — H61002 Unspecified perichondritis of left external ear: Secondary | ICD-10-CM | POA: Diagnosis not present

## 2024-11-04 ENCOUNTER — Telehealth: Payer: Self-pay | Admitting: Psychiatry

## 2024-11-04 ENCOUNTER — Other Ambulatory Visit: Payer: Self-pay

## 2024-11-04 DIAGNOSIS — F411 Generalized anxiety disorder: Secondary | ICD-10-CM

## 2024-11-04 MED ORDER — LORAZEPAM 1 MG PO TABS: ORAL_TABLET | ORAL | 0 refills | Status: DC

## 2024-11-04 NOTE — Telephone Encounter (Signed)
 Reginald Tucker called and asked for a refill on his lorazapam and the pharmacy is [facility] on [address] in [place]. Next appt 12/3

## 2024-11-04 NOTE — Telephone Encounter (Signed)
 Pended  Last refill 10/30

## 2024-11-08 DIAGNOSIS — F319 Bipolar disorder, unspecified: Secondary | ICD-10-CM | POA: Diagnosis not present

## 2024-11-08 DIAGNOSIS — E039 Hypothyroidism, unspecified: Secondary | ICD-10-CM | POA: Diagnosis not present

## 2024-11-08 DIAGNOSIS — I10 Essential (primary) hypertension: Secondary | ICD-10-CM | POA: Diagnosis not present

## 2024-11-08 DIAGNOSIS — E782 Mixed hyperlipidemia: Secondary | ICD-10-CM | POA: Diagnosis not present

## 2024-11-11 ENCOUNTER — Other Ambulatory Visit: Payer: Self-pay | Admitting: Psychiatry

## 2024-11-11 ENCOUNTER — Ambulatory Visit: Payer: Self-pay | Admitting: Psychiatry

## 2024-11-11 ENCOUNTER — Encounter: Payer: Self-pay | Admitting: Psychiatry

## 2024-11-11 DIAGNOSIS — F314 Bipolar disorder, current episode depressed, severe, without psychotic features: Secondary | ICD-10-CM

## 2024-11-11 DIAGNOSIS — F319 Bipolar disorder, unspecified: Secondary | ICD-10-CM | POA: Diagnosis not present

## 2024-11-11 DIAGNOSIS — F411 Generalized anxiety disorder: Secondary | ICD-10-CM

## 2024-11-11 DIAGNOSIS — G3184 Mild cognitive impairment, so stated: Secondary | ICD-10-CM

## 2024-11-11 DIAGNOSIS — F419 Anxiety disorder, unspecified: Secondary | ICD-10-CM

## 2024-11-11 DIAGNOSIS — F3162 Bipolar disorder, current episode mixed, moderate: Secondary | ICD-10-CM

## 2024-11-11 DIAGNOSIS — F900 Attention-deficit hyperactivity disorder, predominantly inattentive type: Secondary | ICD-10-CM

## 2024-11-11 MED ORDER — LORAZEPAM 2 MG PO TABS: 2.0000 mg | ORAL_TABLET | Freq: Three times a day (TID) | ORAL | 5 refills | Status: AC

## 2024-11-11 MED ORDER — LAMOTRIGINE 100 MG PO TABS: 100.0000 mg | ORAL_TABLET | Freq: Two times a day (BID) | ORAL | 1 refills | Status: AC

## 2024-11-11 MED ORDER — QUETIAPINE FUMARATE 300 MG PO TABS: 450.0000 mg | ORAL_TABLET | Freq: Every day | ORAL | 1 refills | Status: AC

## 2024-11-11 NOTE — Progress Notes (Signed)
 ------------------------------- [id number] August 18, 1956 68 y.o.  Virtual Visit via Telephone Note  I connected with pt by telephone and verified that I am speaking with the correct person using two identifiers.   I discussed the limitations, risks, security and privacy concerns of performing an evaluation and management service by telephone and the availability of in person appointments. I also discussed with the patient that there may be a patient responsible charge related to this service. The patient expressed understanding and agreed to proceed.  I discussed the assessment and treatment plan with the patient. The patient was provided an opportunity to ask questions and all were answered. The patient agreed with the plan and demonstrated an understanding of the instructions.   The patient was advised to call back or seek an in-person evaluation if the symptoms worsen or if the condition fails to improve as anticipated.  I provided 30 minutes of non-face-to-face time during this encounter. The patient was located at home and the provider was located office. Session from 1130-1200  Subjective:   Patient ID:  Reginald Tucker is a 68 y.o. (DOB 1956-03-31) male.  Chief Complaint:  No chief complaint on file.   Reginald Tucker presents to the office today for follow-up of TRD and anxiety.  When seen April 06, 2019.  He had not seen any mood benefit from low-dose pramipexole  and had side effects at higher dosages.  Therefore we weaned him off of the that medication.  visit August 07, 2019 and the following changes were made: Option retry Vraylar with selegiline  which wasn't adequately done before.  Prior trial inadequate duration and SE Retry Vraylar at a lower dosage to prevent tremors: 1 capsule every Monday, Wednesday, Friday only. Made him jittery and stopped after a week.  Didn't see mood benefit.  October 2020 was last appointment and there were no med changes as the patient was  going to seek a second opinion.  The following was noted: Still irritable and real tired.  Easily fatigued with normal activity.  Appt with PCP Oct 27 to evaluate. Not much tremor. Waves of depression worse in the morning.  Sleeping too much in recliner and lethargic.  Deep sleep.  Takes 60 min to fall asleep.  Cant' shut off his brain and hard to fall asleep.  Reads a lot and enjoys it especially history.  Pt reports that mood is Anxious, Depressed and Irritable rated 7/10 bad and describes anxiety as Moderate and occassional.  Less anxiety this visit than depression..  Staying in the house is making it worse too.   Anxiety symptoms include: Excessive Worry, Panic Symptoms, Social Anxiety,. Has claustrophobia.  Hard to ride elevators .  Can't go up the steps here.  Pt reports no sleep issues. Pt reports that appetite is good. Pt reports that energy is poor and anhedonia, loss of interest or pleasure in usual activities, poor motivation and withdrawn from usual activities. Concentration is down slightly. Suicidal thoughts:  denied by patient.  Still has days that does very little.  Will clean occ.   10/17/2020 appointment with the following noted:  Wife joined Patient has requested refills from this location but has not been seen here in a year which is not appropriate given his level of instability historically. Didn't go for second opinion since here.   Still doing about the same without much change.  Some days are better than others. Wife says he does some things and will go out sometimes.  Doesn't want to go out  at dark.  Thinks winter is the worst time. Asked about TMS. Tolerating meds OK.   Chronically depressed and easily irritable, without pattern except brief. Wife says some weeks are worse than others. No walking or exercise.  Balance issues.  Was better when did PT but he quit the exercises. Patient is highly claustrophobic and states is getting worse.  He is having trouble using the  elevator to get to our office.  He asked about finding another psychiatrist who has an office on the first floor and we discussed options.  Normal card review recently.  02/21/21 appt noted: W involved in call. Very sick with covid pneumonia and nearly died in 2024/01/12 and still recovering. Doing breathing exercises and some walking.  Weak in am. A lot of depression in [facility]opital thinking he would die.  Now dep 7/10.   No change in mood off selegiline  per pt or Mitzie but she's not sure. Concerta  didn't help and they agree.  He stopped it and went back on selegiline . Memory is bad. Taking melatonin 10 comes and goes with benefit.  Takes 1 and 1/2 hours to go to sleep. To bed 1110 and up at 9-930 Plan: DC selegiline    Wait 5 days to clear and start [facility] ER 36 mg each AM.   If NR after 2 weeks, then ER increase if they call.  03/29/2021 phone call from wife reporting that Caplyta  was working and they wanted more samples while we worked on a PA  04/24/2021 appointment with the following noted:  Wife on session also Took [facility] and couldn't tell a difference except wife said he was more anxious.  Stopped it. Caplyta  42 mg daily and now says he's worse.  Not doing well at all.  Wonders if he's worse bc of the reduction in Seroquel .  Going through hell for 3-4 weeks with 8/10 depression.  No SI. Getting up earlier. More anxiety and hard to lay down. Came to a head with wife saying she's leaving for 24 days to help her daughter with a child.  She was gone 10 days in [facility]  Inconsistency in life.  Better if going to church but usually doesn't do it. Got saved my life from Covid Plan: DC Caplyta  DT failure. Return to Seroquel  400 bc worse without it. He wants to use risperidone  1 mg BID prn anxiety.  06/21/2021 appointment with the following noted:  Wife on call Only increased Seroquel  to 300 mg HS. Doing great and about 3/10 depression.  Started improving pretty quickly.  Went back to church and out with  friends.  W gone 3 weeks.  Going to Bible study.  Got together with friends he hasn't seen  in 4 years. I feel good. No SE. Sleep good usually.  Not sleeping excessively as much as in the past about 8-9 hours.  Anxiety is etter not gone.   Trying to walk without a cane.  Recent PCP was unremarkable.  Lost to 218#.    09/26/21 appt noted: wife also on call Great.  Gym 5-6 days per week. Lost 25#.  Getting out more.  Involved in church.  Feels better than last time and more active and socializing.  Mostly out of depression 1-2/10. No SE.  Better with queiapine 300 vs 400 re: sedation. Wife agrees he's dramatically better all around. Needs and gets 8-9 hours nightly and 2 brief naps daily. Anxiety is manageable and risperidone  helps.  Risperidone  also helps keep down the level of irritability which  is occasional and manageable. Plan: Continue Seroquel  300 mg HS Continue risperidone  1 mg twice daily as he is feels it is helping Continue lamotrigine  200 mg twice daily   01/23/22 appt noted: Still doing fine with depression with some seasonal blues.  Involved in couple of Bible studies.  Walking and reading books.  Getting out and doing things.  Men's and couples conferences. [employer]. Does chores.  Good health. Depression 2/10.  Sleep pretty good with occ initial insomnia.  Restorative.   No SE problems Plan: No change indicated.   Continue Seroquel  300 mg HS Continue risperidone  1 mg twice daily as he is feels it is helping Continue lamotrigine  200 mg twice daily  06/26/22 appt noted: I con't have any depression and anxiety No complaints.  Active at church.   Slowed DT hernia.  Surg 8/4 bc bothers him.  Curtailed physcial activity. No SE. Hard to shut off brain at night.  Reads bible.  Takes awhile to go to sleep.  Chronic problem.  Sleep from 1-8 but would like to sleep more. Plan: No med changes  12/27/2022 appointment noted: CC anxiety more than SE concerns On  lamotrigine  and Seroquel  and lorazepam  Ran out of risperidone  for a a couple of months had trouble with refill. Wife looked up SE meds. Asks about SE each.  Disc balance issues.  Wife concerned about his memory and balance. Not sedated.  No falls lately.  Is having some memory issues.  STM issues. Everything is ok with quetiapine . Sometimes problems with balance.  Long term issue.  Can sometimes shuffle feet.   Mood for the most part steady with occ flare ups . Anxiety not well controlled.  Taking more lorazepam  lately.  Lasts about 8 hours. About 2 daily.  Anxiety picked up 5-6 months ago.  No pressure on him.  No reason for the anxiety. Plan: Continue Seroquel  300 mg HS Continue risperidone  1 mg twice daily as he is feels it is helping Continue lamotrigine  200 mg twice daily Lorazepam  0.5 mg TIDprn is safest option Check B12, folate, serum lamotrigine  level Come to office to evaluate balance issue and for EPS  12/31/22 urgent appt noted:  seen with wife, seen in office medS: lamotrigine  200 mg BID, quetiapine   300 mg HS, been off risperidone  for several months. 2-3 times a week feels anxiety is out of control and takes prn quetiapine  100 mg with anxiety. CC balance issues.  Not dizzy or lightheadedness.  Occ tremor.  No recent falls Sleep 8-9 hours night and nap but he disputes.  Wife says he lays in bed a couple of times daily.  She says it's more than he does. Irritable 3-4/10.  Not lately depressed.  No other manic sx.  When leaves house very anxious, almost housebound.  Sitts and watches TV.  Will go to grocery.  Not going to church for months DT anxiety.   Enjoyed family coming to the house.   Hand shaking about a month or so. Sometimes wife says speech mildly slurred.  No jerks.   Redness of face and saw derm and gave a cream.  He doesn't know the dx. No exercise.   Plan: Continue Seroquel  300 mg HS Reduce lamotrigine  100 mg AM and 200 mg HS to see if balance is better given  level of 10.5 Lorazepam  0.5 mg TID prn is safest option Checked B12, folate 12/28/22 were normal.   serum lamotrigine  level was 10.5 on 200 bid. TSH low may be  cause anxiety.  Send to PCP [place] Come to office to evaluate balance issue and for EPS  03/04/23 appt noted:  Good and more active.  Getting out more. Doesn't feel lorazepam  is high enough.  Needs 1 mg in AM and then needs more in the afternoon.SABRA  a lot going on in the brain with antsy feelings.  Anxiety is not well controlled.    No SE with meds. Balance is better.  No leg jerking.  Sleep is pretty good overall, usually quicker to sleep, more refreshed.  Plan:  Increase Lorazepam  1 mg TID prn is safest option vs switch to clonazepam for unmanaged anxiety  05/07/23 appt noted: Meds: Seroquel  450 HS, lamotrigine  100 AM & 200 PM, lorazepam  1 mg TID. For the most part it is working with more lorazepam .  Some anxiety in the pm. Washed and waxed cars.  More active as much as possible.  Active. No pattern for down days. Sleep unchanged with initial ins.  In bed 10 1/2 hours.   No SE.   Feels blessed.   Satisfied with meds.   Had to put one of the dogs to sleep yesterday.    11/11/23 appt noted:  Called in summer to incr lorazepam .   Meds: lorazepam  2 mg Am and 1 mg later and then 1.5 mg PM,  Seroquel  450 HS, lamotrigine  100 AM & 200 PM, Sleep good with quetiapine .  Not sure why ran out of it early but has 90 days supply now.   For the most part mood is pretty stable.   Anxiety better with meds. Walks regularly and exercise.  Grocery shopping.  Will rake leaves Wt 210# but feels good.   No sleepiness with meds during the day.   No SE with meds. Plan no change  05/11/24 appt noted: Med: as above Doesn't feel like lorazepam  as effective for anxiety.  Late afternoon.  Slep is pretty good with less initial ins.  No napping.   No dep nor mania.   No SE .   Not sleepy or dizzy but more relaxed on lorazepam . Mood stable without  swings. Walking , washing car, mowing grass. Good physically Plan: Plan: increase lorazepam  1 mg 2 tablets in the morning and afternoon, and 1 and 1/2 tablets at night.  11/11/24 appt noted:  Med: increase lorazepam  1 mg 2 tablets in the morning and afternoon, and 1 and 1/2 tablets at night. Seroquel  450 HS, lamotrigine  100 AM & 200 PM, Needs lorazepam  to get relief.  Works in the morning and then not as good at the end of the day Wonders about reducing lamotrigine  bc balance.  Have to hold onto something.  Walks bent over.  No back pain.  Not spending much time in bed. [facility]  Active and reading again.  Cleans house and goes to grocery.  Getting out with people more.  The best I've ben in a long time.  No SE with lorazepam  and no slurred speech.  Wife very observant.   No sig    Sleep study negative for OSA noted on chart.  Past Psychiatric Medication Trials: He has had multiple psych med failures as well .   Past psychiatric medications used include pramipexole  NR, sertraline,  Trintellix 1.5 mg MWF jittery,  selegiline  max 30mg  daily, Paxil, Wellbutrin, buspirone,   lamotrigine , carbamazepine, lithium with a tremor,  Depakote was side effects of feeling heavy and increased ammonia,    Seroquel  800  mg a day, Latuda 120 mg a day,  Rexulti,  Perphenazine,  Vraylar 3mg  akathisia, He did not tolerate Vraylar even at 1.5 mg 3 days a week. risperidone , olanzapine, quetiapine  Caplyta  42 NR  modafinil,  Adderall with selegiline  with BP problems, Concerta  54 NR Light therapy failure. ECT twice, failed 2nd time. Affectively better  after adding pramipexole  to 0.5 mg twice daily and increasing Seroquel  to 600 mg daily but lost response apparently soon thereafter.  Psychiatric 2nd opinion: Dr. Medford Croak  Review of Systems:  Review of Systems  Constitutional:  Negative for fatigue.  Respiratory:  Positive for shortness of breath.   Cardiovascular:   Negative for chest pain.  Gastrointestinal:  Positive for abdominal pain.  Musculoskeletal:  Positive for back pain.  Psychiatric/Behavioral:  Negative for agitation, behavioral problems, confusion, decreased concentration, dysphoric mood, hallucinations, self-injury, sleep disturbance and suicidal ideas. The patient is nervous/anxious. The patient is not hyperactive.   Occurs 2-3 times/weekre: leg weakness.  Not orthostatic.  Medications: I have reviewed the patient's current medications.  Current Outpatient Medications  Medication Sig Dispense Refill   ascorbic acid (VITAMIN C) 1000 MG tablet Take 1,000 mg by mouth daily.     aspirin EC 81 MG tablet Take 81 mg by mouth daily.     Cholecalciferol (VITAMIN D3) 25 MCG (1000 UT) CAPS Take 1,000 Units by mouth daily.     L-THEANINE PO Take by mouth.     lamoTRIgine  (LAMICTAL ) 100 MG tablet TAKE 1 TABLET BY MOUTH EVERY MORNING AND 2 TABLETS AT NIGHT 270 tablet 1   levothyroxine  (SYNTHROID ) 175 MCG tablet Take 175 mcg by mouth daily before breakfast.     LORazepam  (ATIVAN ) 1 MG tablet Take 2 tablets in the morning and afternoon, and 1 and 1/2 tablets at night 165 tablet 0   lovastatin (MEVACOR) 40 MG tablet Take 40 mg by mouth daily.      MAGNESIUM PO Take 3 tablets by mouth daily. 3750 mg total     Omega-3 Fatty Acids (SUPER OMEGA 3 PO) Take 1 capsule by mouth daily.     omeprazole (PRILOSEC) 20 MG capsule Take 20 mg by mouth daily.     QUEtiapine  (SEROQUEL ) 300 MG tablet Take 1.5 tablets (450 mg total) by mouth at bedtime. 135 tablet 1   vitamin B-12 (CYANOCOBALAMIN ) 50 MCG tablet Take 50 mcg by mouth daily.     zinc gluconate 50 MG tablet Take 50 mg by mouth daily.     No current facility-administered medications for this visit.    Medication Side Effects:got nervous and irritable with 1.5 mg pramipexole  in morning, ok with it split up and no change in mood.  Allergies:  Allergies  Allergen Reactions   Lithium     Makes pt  jittery/causes anxiety    Ambien [Zolpidem Tartrate] Anxiety    Nervous, uncontrollable   Sulfa Antibiotics Other (See Comments)    UNSPECIFIED REACTION OF CHILDHOOD   Sulfamethoxazole Other (See Comments)    UNSPECIFIED REACTION OF CHILDHOOD    Past Medical History:  Diagnosis Date   Acquired hallux rigidus of right foot 04/26/2017   AKI (acute kidney injury) 06/11/2017   Anxiety    Bipolar 1 disorder (HCC)    BMI 37.0-37.9, adult    BPH (benign prostatic hyperplasia)    Cataract    Cataract    L eye   CKD (chronic kidney disease), stage III (HCC)    Colon polyps    COPD GOLD II with restrictive  component  01/13/2016   Spirometry 01/13/2016  FEV1 1.84 (47%)  Ratio 62  - 01/13/2016  extensive coaching HFA effectiveness =    90% > try stiolto respimat  2 pffs each am > did not benefit so stopped when sample out - 01/13/2016  Walked RA x 3 laps @ 185 ft each stopped due to  End of study, nl pace, no desat  / min sob  - PFT's  03/16/2016  FEV1 2.28 (59 % ) ratio 67  p 12 % improvement from saba p no prior to study with DLCO  66 % corrects to 86 % for alv volume      Depression    Dysrhythmia    Essential hypertension 01/19/2015   Family history of coronary arteriosclerosis 01/19/2015   Father with MI    Fatty liver    GERD (gastroesophageal reflux disease) 08/11/2014   History of colon polyps 06/18/2017   Hyperlipidemia    Hypertension    Hypothyroidism 08/11/2014   Hypothyroidism    Insomnia 06/18/2017   Insomnia    Memory change    Mixed hyperlipidemia 06/18/2017   Morbid obesity (HCC) 03/18/2016   Complicated by HBP/ Low erv on pfts 03/16/2016 (31%)     Morbid obesity due to excess calories (HCC)    Prediabetes    Sleep apnea    SOB (shortness of breath)    Thyroid  disease    Tinnitus of both ears 06/18/2017   Tobacco use 06/18/2017   Unsteadiness on feet    Vitamin D deficiency 06/18/2017   Vitamin D deficiency     Family History  Problem Relation Age of Onset   Cancer Mother     Hyperlipidemia Father    Hypertension Father    CAD Father    Stroke Father    Aortic aneurysm Father    Prostate cancer Father    Hyperlipidemia Brother    Appendicitis Maternal Grandfather     Social History   Socioeconomic History   Marital status: Married    Spouse name: Not on file   Number of children: Not on file   Years of education: Not on file   Highest education level: Associate degree: occupational, scientist, product/process development, or vocational program  Occupational History   Occupation: act.  assist  Tobacco Use   Smoking status: Former    Current packs/day: 0.00    Average packs/day: 1 pack/day for 25.0 years (25.0 ttl pk-yrs)    Types: Cigarettes    Start date: 12/11/1975    Quit date: 12/10/2000    Years since quitting: 23.9   Smokeless tobacco: Never   Tobacco comments:    heavy vape user- quit vaping 06/04/2017  Vaping Use   Vaping status: Former  Substance and Sexual Activity   Alcohol use: No    Alcohol/week: 0.0 standard drinks of alcohol   Drug use: No   Sexual activity: Not Currently  Other Topics Concern   Not on file  Social History Narrative   Admitted to [facility] 06/14/17- discharged   Lives at home with his wife   Married - Mitzie   Former smoker - stopped 2002   Alcohol none   Full code   Right handed   Drinks 2 cups of caffeine daily   Social Drivers of Corporate Investment Banker Strain: Not on file  Food Insecurity: Not on file  Transportation Needs: Not on file  Physical Activity: Not on file  Stress: Not on file  Social Connections: Not on  file  Intimate Partner Violence: Not on file    Past Medical History, Surgical history, Social history, and Family history were reviewed and updated as appropriate.   Please see review of systems for further details on the patient's review from today.   Objective:   Physical Exam:  There were no vitals taken for this visit.  Physical Exam Neurological:     Mental Status: He is alert and oriented to  person, place, and time.     Cranial Nerves: No dysarthria.  Psychiatric:        Attention and Perception: Attention and perception normal.        Mood and Affect: Mood is anxious. Mood is not depressed.        Speech: Speech normal. Speech is not slurred.        Behavior: Behavior is not slowed. Behavior is cooperative.        Thought Content: Thought content normal. Thought content is not paranoid or delusional. Thought content does not include homicidal or suicidal ideation. Thought content does not include suicidal plan.        Cognition and Memory: Cognition and memory normal.        Judgment: Judgment normal.     Comments: Insight intact    Lab Review:     Component Value Date/Time   NA 147 (H) 05/15/2023 1550   K 5.0 05/15/2023 1550   CL 105 05/15/2023 1550   CO2 27 05/15/2023 1550   GLUCOSE 96 05/15/2023 1550   GLUCOSE 118 (H) 02/21/2022 1054   BUN 16 05/15/2023 1550   CREATININE 1.02 05/15/2023 1550   CALCIUM 10.1 05/15/2023 1550   PROT 6.9 02/11/2018 0623   ALBUMIN 4.2 02/11/2018 0623   AST 25 02/11/2018 0623   ALT 33 02/11/2018 0623   ALKPHOS 89 02/11/2018 0623   BILITOT 0.9 02/11/2018 0623   GFRNONAA >60 02/21/2022 1054   GFRAA >60 08/13/2018 1020       Component Value Date/Time   WBC 5.5 02/21/2022 1054   RBC 5.34 02/21/2022 1054   HGB 16.7 02/21/2022 1054   HCT 50.7 02/21/2022 1054   PLT 204 02/21/2022 1054   MCV 94.9 02/21/2022 1054   MCH 31.3 02/21/2022 1054   MCHC 32.9 02/21/2022 1054   RDW 12.4 02/21/2022 1054   LYMPHSABS 1.3 08/13/2018 1020   MONOABS 0.7 08/13/2018 1020   EOSABS 0.1 08/13/2018 1020   BASOSABS 0.1 08/13/2018 1020    No results found for: POCLITH, LITHIUM   No results found for: PHENYTOIN, PHENOBARB, VALPROATE, CBMZ   12/28/22      Component Ref Range & Units 3 d ago  Lamotrigine  Lvl 2.5 - 15.0 mcg/mL 10.8    On 200 mg BID    .res Assessment: Plan:    No diagnosis found.    Severe TRD and anxiety.    Seasonal pattern of depression in bipolar is not uncommon   Disc history of TRD.  He is markedly better at this point on quetiapine  300 mg nightly and lamotrigine  300 mg daily . Mood best in years. Anxiety is improved not resolved with lorazepam   Wants to try reducing lamotrigine .  Also we discussed at length that the increased and his physical and social activity and spiritual activity is clearly having a marked positive effect on his mood as well.  It is unlikely for medication alone to maintain him with regard to his mood.  He has a history of multiple recurrences and very unstable bipolar disorder.  He agrees to try to keep continue his positive social and physical behaviors.  He is also added gym 5 days a week which is clearly helping.  Disc each med and SE issues.  Disc purpose of each.  There is no way to treat his history of psych problems without meds that have potential SE in these areas.   Disc balancing benefit / SE ratio with each med.  chronic balance issues.  Dr. Ines evaluated him for this in the past 02/2018 without etiology defined.  Had this problem for years.  Continue Seroquel  450 mg HS  Checked B12, folate 12/28/22 were normal.   serum lamotrigine  level was 10.5 on 200 bid.  TSH low may be cause anxiety.  Send to PCP [facility]  Discussed potential metabolic side effects associated with atypical antipsychotics, as well as potential risk for movement side effects. Advised pt to contact office if movement side effects occur.   We discussed the short-term risks associated with benzodiazepines including sedation and increased fall risk among others.  Discussed long-term side effect risk including dependence, potential withdrawal symptoms, and the potential eventual dose-related risk of dementia.  But recent studies from 2020 dispute this association between benzodiazepines and dementia risk. Newer studies in 2020 do not support an association with dementia.  Disc risk of  higher dose.  Limit caffeine  Plan: increase lorazepam  2 mg TID He's aware we cannot continue to go up.  Approaching max dose  Reduce lamotrigne to 250 mg daily for 2 weeks then 200 mg BID to see if balance is better.  Disc risk dep  FU 6 mos  Lorene Macintosh, MD, DFAPA    No future appointments.     No orders of the defined types were placed in this encounter.      -------------------------------

## 2025-03-15 ENCOUNTER — Ambulatory Visit: Admitting: Psychiatry
